# Patient Record
Sex: Female | Born: 1965 | Race: Black or African American | Hispanic: No | Smoking: Former smoker
Health system: Southern US, Community
[De-identification: ages and names within clinical notes are randomized; demographics above are authoritative.]

## PROBLEM LIST (undated history)

## (undated) DIAGNOSIS — Z973 Presence of spectacles and contact lenses: Secondary | ICD-10-CM

## (undated) DIAGNOSIS — F329 Major depressive disorder, single episode, unspecified: Secondary | ICD-10-CM

## (undated) DIAGNOSIS — K581 Irritable bowel syndrome with constipation: Secondary | ICD-10-CM

## (undated) DIAGNOSIS — E890 Postprocedural hypothyroidism: Secondary | ICD-10-CM

## (undated) DIAGNOSIS — G4733 Obstructive sleep apnea (adult) (pediatric): Secondary | ICD-10-CM

## (undated) DIAGNOSIS — F319 Bipolar disorder, unspecified: Secondary | ICD-10-CM

## (undated) DIAGNOSIS — E042 Nontoxic multinodular goiter: Secondary | ICD-10-CM

## (undated) DIAGNOSIS — K760 Fatty (change of) liver, not elsewhere classified: Secondary | ICD-10-CM

## (undated) DIAGNOSIS — F419 Anxiety disorder, unspecified: Secondary | ICD-10-CM

## (undated) DIAGNOSIS — N182 Chronic kidney disease, stage 2 (mild): Secondary | ICD-10-CM

## (undated) DIAGNOSIS — R000 Tachycardia, unspecified: Secondary | ICD-10-CM

## (undated) DIAGNOSIS — G5603 Carpal tunnel syndrome, bilateral upper limbs: Secondary | ICD-10-CM

## (undated) DIAGNOSIS — N809 Endometriosis, unspecified: Secondary | ICD-10-CM

---

## 1993-12-03 HISTORY — PX: DIAGNOSTIC LAPAROSCOPY: SUR761

## 2006-12-03 HISTORY — PX: ESOPHAGOGASTRODUODENOSCOPY: SHX1529

## 2014-12-03 HISTORY — PX: FOOT SURGERY: SHX648

## 2017-12-03 HISTORY — PX: COLONOSCOPY: SHX174

## 2018-08-29 ENCOUNTER — Encounter: Payer: Self-pay | Admitting: *Deleted

## 2018-08-29 DIAGNOSIS — G3184 Mild cognitive impairment, so stated: Secondary | ICD-10-CM | POA: Insufficient documentation

## 2018-08-29 DIAGNOSIS — F3162 Bipolar disorder, current episode mixed, moderate: Secondary | ICD-10-CM | POA: Insufficient documentation

## 2018-08-29 DIAGNOSIS — F429 Obsessive-compulsive disorder, unspecified: Secondary | ICD-10-CM

## 2018-09-08 ENCOUNTER — Ambulatory Visit: Payer: Self-pay | Admitting: Psychiatry

## 2018-09-17 ENCOUNTER — Ambulatory Visit: Payer: Self-pay | Admitting: Psychiatry

## 2018-10-06 ENCOUNTER — Ambulatory Visit: Payer: Federal, State, Local not specified - PPO | Admitting: Psychiatry

## 2018-10-06 ENCOUNTER — Encounter: Payer: Self-pay | Admitting: Psychiatry

## 2018-10-06 DIAGNOSIS — F411 Generalized anxiety disorder: Secondary | ICD-10-CM | POA: Diagnosis not present

## 2018-10-06 DIAGNOSIS — G3184 Mild cognitive impairment, so stated: Secondary | ICD-10-CM | POA: Diagnosis not present

## 2018-10-06 DIAGNOSIS — F319 Bipolar disorder, unspecified: Secondary | ICD-10-CM

## 2018-10-06 DIAGNOSIS — F422 Mixed obsessional thoughts and acts: Secondary | ICD-10-CM | POA: Diagnosis not present

## 2018-10-06 NOTE — Progress Notes (Signed)
Kristen Leon [id number] [birth year] 52 y.o.  Subjective:   Patient ID:  Kristen Leon is a 52 y.o. (DOB May 17, 1966) female.  Chief Complaint:  Chief Complaint  Patient presents with  . cognitive problems  . Depression  . Anxiety  . Follow-up    med changes    HPI Kristen Leon presents to the office today for follow-up of bipolar disorder OCD, anxiety, sleep problems and cognitive problems. Still having anxiety.  More anxious this time of year driving when it gets dark earlier. No avoidance.  Anxiety is a little worse off the Neurontin and so she uses it prn.  No cognitive benefits off of it. Not as much depression with the Vraylar.  But still a lot of emotional blunting to people or things pre-existed the Vraylar.  Vraylar helped the irritability by reducing the negative thoughts.  Still thinks she has some with her kids.  No major mood swings. Sleep pretty good usually.  Takes sleep meds Sonata at times. When she first took Lamictal it helped irritability but it stopped working within days. Over all pretty good since starting Vraylar which helped with negative thoughts. Less scattered on the Namenda.  Saw a neurologist about it and he agreed.  He's ordered follow up neuropsychological testing.  Review of Systems:  Review of Systems  Neurological: Negative for tremors and weakness.  Psychiatric/Behavioral: Negative for agitation, behavioral problems, confusion, decreased concentration, dysphoric mood, hallucinations, self-injury, sleep disturbance and suicidal ideas. The patient is not nervous/anxious and is not hyperactive.     Medications: I have reviewed the patient's current medications.  Current Outpatient Medications  Medication Sig Dispense Refill  . Acetylcysteine (NAC) 600 MG CAPS Take 1 capsule by mouth daily.    Marland Kitchen atenolol (TENORMIN) 50 MG tablet Take 50 mg by mouth daily.    . cariprazine (VRAYLAR) capsule Take 3 mg by mouth daily.    . Cholecalciferol (VITAMIN D3)  5000 units CAPS Take 1 capsule by mouth daily.    Marland Kitchen levothyroxine (SYNTHROID, LEVOTHROID) 112 MCG tablet Take 112 mcg by mouth daily before breakfast.    . lithium carbonate 150 MG capsule Take 150 mg by mouth 3 (three) times daily with meals.    . memantine (NAMENDA) 10 MG tablet Take 10 mg by mouth 2 (two) times daily.    . mirtazapine (REMERON) 30 MG tablet Take 30 mg by mouth at bedtime.    Marland Kitchen QUEtiapine (SEROQUEL) 200 MG tablet Take 600 mg by mouth at bedtime.      No current facility-administered medications for this visit.     Medication Side Effects: None  Allergies: Not on File  History reviewed. No pertinent past medical history.  History reviewed. No pertinent family history.  Social History   Socioeconomic History  . Marital status: Divorced    Spouse name: Not on file  . Number of children: Not on file  . Years of education: Not on file  . Highest education level: Not on file  Occupational History  . Not on file  Social Needs  . Financial resource strain: Not on file  . Food insecurity:    Worry: Not on file    Inability: Not on file  . Transportation needs:    Medical: Not on file    Non-medical: Not on file  Tobacco Use  . Smoking status: Former Smoker    Last attempt to quit: 2000    Years since quitting: 19.8  . Smokeless tobacco: Never Used  Substance and Sexual  Activity  . Alcohol use: Not on file  . Drug use: Not on file  . Sexual activity: Not on file  Lifestyle  . Physical activity:    Days per week: Not on file    Minutes per session: Not on file  . Stress: Not on file  Relationships  . Social connections:    Talks on phone: Not on file    Gets together: Not on file    Attends religious service: Not on file    Active member of club or organization: Not on file    Attends meetings of clubs or organizations: Not on file    Relationship status: Not on file  . Intimate partner violence:    Fear of current or ex partner: Not on file     Emotionally abused: Not on file    Physically abused: Not on file    Forced sexual activity: Not on file  Other Topics Concern  . Not on file  Social History Narrative  . Not on file    Past Medical History, Surgical history, Social history, and Family history were reviewed and updated as appropriate.   Please see review of systems for further details on the patient's review from today.   Objective:   Physical Exam:  There were no vitals taken for this visit.  Physical Exam  Constitutional: She is oriented to person, place, and time. She appears well-developed. No distress.  Musculoskeletal: She exhibits no deformity.  Neurological: She is alert and oriented to person, place, and time. She displays no tremor. Coordination and gait normal.  Psychiatric: She has a normal mood and affect. Her speech is normal and behavior is normal. Judgment and thought content normal. Her mood appears not anxious. Her affect is not angry, not blunt, not labile and not inappropriate. Thought content is not paranoid. Cognition and memory are normal. She does not exhibit a depressed mood. She expresses no homicidal and no suicidal ideation. She expresses no suicidal plans and no homicidal plans.  Insight intact. No auditory or visual hallucinations. No delusions.   Still complains of irritability. She is attentive.    Lab Review:  No results found for: NA, K, CL, CO2, GLUCOSE, BUN, CREATININE, CALCIUM, PROT, ALBUMIN, AST, ALT, ALKPHOS, BILITOT, GFRNONAA, GFRAA  No results found for: WBC, RBC, HGB, HCT, PLT, MCV, MCH, MCHC, RDW, LYMPHSABS, MONOABS, EOSABS, BASOSABS  No results found for: POCLITH, LITHIUM   No results found for: PHENYTOIN, PHENOBARB, VALPROATE, CBMZ   .res Assessment: Plan:    Bipolar I disorder (HCC)  Generalized anxiety disorder  Mixed obsessional thoughts and acts  MCI (mild cognitive impairment)  Please see After Visit Summary for patient specific instructions: Reduce  mirtazapine to 15mg  HS to see if irritability is better.  It may help you sleep better also.  Greater than 50% of face to face time with patient was spent on counseling and coordination of care. We discussed her chronic problems with irritability mood anxiety and cognition.  She is benefiting from the addition of Vraylar 3 mg daily Reduce Seroquel to 400mg  at night to potentially help reduce emotional blunting. Counseled patient regarding the use of lithium and low doses to prevent suicidality and improve cognition per Dr. Arlie Solomons.  Provided patient with copy of journal article related to the use of lithium or the prevention of suicidal thoughts.  Discussed that side effect risk is typically minimal with very low doses used to treat chronic suicidal thoughts. Reviewed her neuropsychological testing results which  she provided.  She thinks sx are similar to what was noted in 2014.  She saw no change in cognition off gabapentin.  She could consider going back on gabapentin for anxiety but we will defer that change. Reduce mirtazapine to 15mg  HS to see if irritability is better. Discussed potential metabolic side effects associated with atypical antipsychotics, as well as potential risk for movement side effects. Advised pt to contact office if movement side effects occur.  The Seroquel in higher doses may diminish some of the benefit of the Vraylar.  That is the reason for the change in dosage.  She is call if she has any mood cycling or insomnia.  This is a 30-minute appointment  Follow-up 2 months  Meredith Staggers MD, DFAPA No future appointments.  No orders of the defined types were placed in this encounter.     -------------------------------

## 2018-10-06 NOTE — Patient Instructions (Addendum)
Reduce mirtazapine to 15mg  HS to see if irritability is better.  It may help you sleep better also.  Reduce Seroquel to 400mg  at night to potentially help reduce emotional blunting.

## 2018-10-13 ENCOUNTER — Other Ambulatory Visit: Payer: Self-pay | Admitting: Psychiatry

## 2018-11-25 ENCOUNTER — Other Ambulatory Visit: Payer: Self-pay | Admitting: Psychiatry

## 2018-11-27 ENCOUNTER — Other Ambulatory Visit: Payer: Self-pay | Admitting: Psychiatry

## 2018-11-28 NOTE — Telephone Encounter (Signed)
Need to review paper chart  

## 2018-12-15 ENCOUNTER — Ambulatory Visit: Payer: Federal, State, Local not specified - PPO | Admitting: Psychiatry

## 2018-12-15 ENCOUNTER — Other Ambulatory Visit: Payer: Self-pay

## 2018-12-15 ENCOUNTER — Encounter: Payer: Self-pay | Admitting: Psychiatry

## 2018-12-15 DIAGNOSIS — F411 Generalized anxiety disorder: Secondary | ICD-10-CM

## 2018-12-15 DIAGNOSIS — F4000 Agoraphobia, unspecified: Secondary | ICD-10-CM

## 2018-12-15 DIAGNOSIS — G3184 Mild cognitive impairment, so stated: Secondary | ICD-10-CM

## 2018-12-15 DIAGNOSIS — F422 Mixed obsessional thoughts and acts: Secondary | ICD-10-CM

## 2018-12-15 DIAGNOSIS — F310 Bipolar disorder, current episode hypomanic: Secondary | ICD-10-CM

## 2018-12-15 MED ORDER — GABAPENTIN 600 MG PO TABS: 1200.0000 mg | ORAL_TABLET | Freq: Every day | ORAL | 0 refills | Status: DC

## 2018-12-15 MED ORDER — LORAZEPAM 0.5 MG PO TABS: 0.5000 mg | ORAL_TABLET | Freq: Three times a day (TID) | ORAL | 0 refills | Status: DC | PRN

## 2018-12-15 MED ORDER — ZALEPLON 10 MG PO CAPS: 10.0000 mg | ORAL_CAPSULE | Freq: Every evening | ORAL | 0 refills | Status: DC | PRN

## 2018-12-15 MED ORDER — CARIPRAZINE HCL 4.5 MG PO CAPS: 4.5000 mg | ORAL_CAPSULE | Freq: Every day | ORAL | 1 refills | Status: DC

## 2018-12-15 NOTE — Patient Instructions (Signed)
Increase Vraylar to 4.5 mg daily.
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
## 2019-01-12 ENCOUNTER — Ambulatory Visit: Payer: Federal, State, Local not specified - PPO | Admitting: Psychiatry

## 2019-01-12 ENCOUNTER — Encounter: Payer: Self-pay | Admitting: Psychiatry

## 2019-01-12 DIAGNOSIS — F4000 Agoraphobia, unspecified: Secondary | ICD-10-CM | POA: Diagnosis not present

## 2019-01-12 DIAGNOSIS — G3184 Mild cognitive impairment, so stated: Secondary | ICD-10-CM

## 2019-01-12 DIAGNOSIS — F422 Mixed obsessional thoughts and acts: Secondary | ICD-10-CM | POA: Diagnosis not present

## 2019-01-12 DIAGNOSIS — F310 Bipolar disorder, current episode hypomanic: Secondary | ICD-10-CM

## 2019-01-12 DIAGNOSIS — F411 Generalized anxiety disorder: Secondary | ICD-10-CM
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
## 2019-02-05 ENCOUNTER — Other Ambulatory Visit: Payer: Self-pay | Admitting: Obstetrics and Gynecology

## 2019-02-09 ENCOUNTER — Other Ambulatory Visit: Payer: Self-pay | Admitting: Psychiatry

## 2019-02-23 ENCOUNTER — Other Ambulatory Visit: Payer: Self-pay | Admitting: Psychiatry

## 2019-02-25 ENCOUNTER — Other Ambulatory Visit: Payer: Self-pay | Admitting: Psychiatry

## 2019-03-08 ENCOUNTER — Other Ambulatory Visit: Payer: Self-pay | Admitting: Psychiatry

## 2019-03-09 ENCOUNTER — Ambulatory Visit (INDEPENDENT_AMBULATORY_CARE_PROVIDER_SITE_OTHER): Payer: Federal, State, Local not specified - PPO | Admitting: Psychiatry

## 2019-03-09 ENCOUNTER — Encounter: Payer: Self-pay | Admitting: Psychiatry

## 2019-03-09 ENCOUNTER — Other Ambulatory Visit: Payer: Self-pay | Admitting: Psychiatry

## 2019-03-09 DIAGNOSIS — F319 Bipolar disorder, unspecified: Secondary | ICD-10-CM | POA: Diagnosis not present

## 2019-03-09 DIAGNOSIS — F411 Generalized anxiety disorder: Secondary | ICD-10-CM

## 2019-03-09 DIAGNOSIS — G3184 Mild cognitive impairment, so stated: Secondary | ICD-10-CM

## 2019-03-09 DIAGNOSIS — F4000 Agoraphobia, unspecified: Secondary | ICD-10-CM

## 2019-03-09 NOTE — Progress Notes (Signed)
Kristen Leon [id number] 1966-10-10 53 y.o.  Subjective:   Patient ID:  Kristen Leon is a 53 y.o. (DOB [birth year]) female.  Chief Complaint:  Chief Complaint  Patient presents with  . Sleeping Problem  . Anxiety  . Follow-up    mood    HPI   Kristen Leon presents  today for follow-up ofBipolar disorder, generalized anxiety disorder, nocturnal panic attacks, history of OCD, and mild cognitive impairment and worsening driving anxiety.  She was last seen January 12, 2019.  She had failed an attempt to switch from Seroquel to [employer].  So we discontinued the Vraylar.  Mood pretty stable and better bc better relationship with kids.  Anxiety is not good with Covid and gets anxious at night and has to get OOB.  Driving anxiety and sense unreality resolved.  Fine with driving now.  She thinks maybe Vraylar increased anxiety and it's more normal now.  Taking gabapentin for anxiety but worries it affects her memory.  Pt reports that mood is Anxious and describes anxiety as Moderate. Anxiety symptoms include: Excessive Worry, Panic Symptoms,. Pt reports has difficulty falling asleep butr variable.  8 hours usually.  Mild mood cycling.. Pt reports that appetite is good. Pt reports that energy is good and good. Concentration is good. Suicidal thoughts:  denied by patient.  When last here had manic symptoms but not since then.  Overall mood is more stable although there is some mild cycling.  Because anxiety is improved and she feels the gabapentin may adversely affect her memory she wants to try to wean off of it.  Suggested weaning over 2-week..  Past Psychiatric Medication Trials: Vraylar 3 SE, Depakote NR, carbamazepine questionable side effects, lamotrigine lost response, Trileptal, clozapine, olanzapine, lithium no response, mirtazapine, several SSRIs, gabapentin, clomipramine had vision side effects, Seroquel 600, risperidone 1 mg twice daily  Review of Systems:  Review of Systems   Neurological: Negative for tremors and weakness.  Psychiatric/Behavioral: Positive for sleep disturbance. Negative for agitation, behavioral problems, confusion, decreased concentration, dysphoric mood, hallucinations, self-injury and suicidal ideas. The patient is nervous/anxious. The patient is not hyperactive.     Medications: I have reviewed the patient's current medications.  Current Outpatient Medications  Medication Sig Dispense Refill  . Acetylcysteine (NAC) 600 MG CAPS Take 1 capsule by mouth daily.    Marland Kitchen atenolol (TENORMIN) 50 MG tablet Take 50 mg by mouth daily.    . B Complex Vitamins (VITAMIN B-COMPLEX) TABS Take by mouth.    . Cholecalciferol (VITAMIN D3) 5000 units CAPS Take 1 capsule by mouth daily.    Marland Kitchen gabapentin (NEURONTIN) 600 MG tablet TAKE 2 TABLETS (=1,200 MG) AT BEDTIME 180 tablet 0  . levothyroxine (SYNTHROID, LEVOTHROID) 112 MCG tablet Take 112 mcg by mouth daily before breakfast.    . linaclotide (LINZESS) 290 MCG CAPS capsule TAKE 1 CAPSULE DAILY    . lithium carbonate 150 MG capsule TAKE 1 CAPSULE EVERY       EVENING 90 capsule 0  . memantine (NAMENDA) 10 MG tablet TAKE 1 TABLET TWICE A DAY 180 tablet 0  . mirtazapine (REMERON) 30 MG tablet TAKE 1 TABLET AT BEDTIME 90 tablet 1  . QUEtiapine (SEROQUEL) 200 MG tablet TAKE 3 TABLETS AT BEDTIME 270 tablet 1  . temazepam (RESTORIL) 15 MG capsule at bedtime as needed.    . zaleplon (SONATA) 10 MG capsule Take 1 capsule (10 mg total) by mouth at bedtime as needed for sleep. 30 capsule 0  . LORazepam (ATIVAN) 0.5  MG tablet Take 1-2 tablets (0.5-1 mg total) by mouth every 8 (eight) hours as needed for anxiety. (Patient not taking: Reported on 03/09/2019) 30 tablet 0   No current facility-administered medications for this visit.     Medication Side Effects: None  Allergies: No Known Allergies  History reviewed. No pertinent past medical history.  History reviewed. No pertinent family history.  Social History    Socioeconomic History  . Marital status: Divorced    Spouse name: Not on file  . Number of children: Not on file  . Years of education: Not on file  . Highest education level: Not on file  Occupational History  . Not on file  Social Needs  . Financial resource strain: Not on file  . Food insecurity:    Worry: Not on file    Inability: Not on file  . Transportation needs:    Medical: Not on file    Non-medical: Not on file  Tobacco Use  . Smoking status: Former Smoker    Last attempt to quit: 2000    Years since quitting: 20.2  . Smokeless tobacco: Never Used  Substance and Sexual Activity  . Alcohol use: Not on file  . Drug use: Not on file  . Sexual activity: Not on file  Lifestyle  . Physical activity:    Days per week: Not on file    Minutes per session: Not on file  . Stress: Not on file  Relationships  . Social connections:    Talks on phone: Not on file    Gets together: Not on file    Attends religious service: Not on file    Active member of club or organization: Not on file    Attends meetings of clubs or organizations: Not on file    Relationship status: Not on file  . Intimate partner violence:    Fear of current or ex partner: Not on file    Emotionally abused: Not on file    Physically abused: Not on file    Forced sexual activity: Not on file  Other Topics Concern  . Not on file  Social History Narrative  . Not on file    Past Medical History, Surgical history, Social history, and Family history were reviewed and updated as appropriate.   Please see review of systems for further details on the patient's review from today.   Objective:   Physical Exam:  There were no vitals taken for this visit.  Physical Exam Neurological:     Mental Status: She is alert and oriented to person, place, and time.     Cranial Nerves: No dysarthria.  Psychiatric:        Attention and Perception: Attention normal.        Mood and Affect: Mood is anxious.         Speech: Speech normal.        Behavior: Behavior is cooperative.        Thought Content: Thought content normal. Thought content is not paranoid or delusional. Thought content does not include homicidal or suicidal ideation. Thought content does not include homicidal or suicidal plan.        Cognition and Memory: She exhibits impaired recent memory. She does not exhibit impaired remote memory.        Judgment: Judgment normal.     Comments: Insight and judgment appear good.     Lab Review:  No results found for: NA, K, CL, CO2, GLUCOSE, BUN, CREATININE, CALCIUM,  PROT, ALBUMIN, AST, ALT, ALKPHOS, BILITOT, GFRNONAA, GFRAA  No results found for: WBC, RBC, HGB, HCT, PLT, MCV, MCH, MCHC, RDW, LYMPHSABS, MONOABS, EOSABS, BASOSABS  No results found for: POCLITH, LITHIUM   No results found for: PHENYTOIN, PHENOBARB, VALPROATE, CBMZ   .res Assessment: Plan:    Bipolar I disorder (HCC)  Generalized anxiety disorder  MCI (mild cognitive impairment)  Agoraphobia   Greater than 50% of face to face time by phone with patient was spent on counseling and coordination of care. We discussed nature of her psychiatric diagnoses .  the patient's anxiety has improved since she was here.  Perhaps due to discontinuing the Vraylar though it seemed to have some positive mood affect she was not able to transition to it without Seroquel.  Because her anxiety is improved she wants to try to wean the gabapentin because she feels it may adversely affect her memory which is an ongoing concern.  Generalized anxiety disorder is improved not resolved.  Agoraphobia has almost resolved.  Encourage she resist the urge to avoid driving including highways. Wean gabapentin over 2-week.  Let us know if the anxiety gets worse.  Bipolar disorder is stable back on Seroquel for the most part.  He has mild cycling failed attempt to wean Seroquel to Vraylar.   Question about whether the Vraylar helped enough to justify it's  use in polypharmacy at 1.5 mg daily..     OK prn lorazepam.  Caution re driving and drowsiness.  We discussed the short-term risks associated with benzodiazepines including sedation and increased fall risk among others.  Discussed long-term side effect risk including dependence, potential withdrawal symptoms, and the potential eventual dose-related risk of dementia. OK continue Neurontin for anxiety or wean whichever she requests.  Discussed potential metabolic side effects associated with atypical antipsychotics, as well as potential risk for movement side effects. Advised pt to contact office if movement side effects occur.   No med changes today  This was a 30-minute appointment  FU 3 month  I connected with patient by a video enabled telemedicine application or telephone, with their informed consent, and verified patient privacy and that I am speaking with the correct person using two identifiers.  I was located at office and patient at home.  Patient did not have the capability technically to do video conferencing.Meredith Staggers.   Carey Cottle, MD, DFAPA   Please see After Visit Summary for patient specific instructions.  No future appointments.  No orders of the defined types were placed in this encounter.     -------------------------------

## 2019-04-23 ENCOUNTER — Other Ambulatory Visit: Payer: Self-pay | Admitting: Psychiatry

## 2019-05-09 ENCOUNTER — Other Ambulatory Visit: Payer: Self-pay | Admitting: Psychiatry

## 2019-06-22 ENCOUNTER — Ambulatory Visit: Payer: Federal, State, Local not specified - PPO | Admitting: Psychiatry

## 2019-06-29 ENCOUNTER — Encounter: Payer: Self-pay | Admitting: Internal Medicine

## 2019-06-29 ENCOUNTER — Telehealth: Payer: Self-pay | Admitting: Pharmacy Technician

## 2019-06-29 ENCOUNTER — Ambulatory Visit: Payer: Federal, State, Local not specified - PPO | Admitting: Internal Medicine

## 2019-06-29 ENCOUNTER — Other Ambulatory Visit: Payer: Self-pay

## 2019-06-29 VITALS — BP 107/78 | HR 75 | Temp 98.7°F

## 2019-06-29 DIAGNOSIS — Z20828 Contact with and (suspected) exposure to other viral communicable diseases: Secondary | ICD-10-CM | POA: Diagnosis not present

## 2019-06-29 NOTE — Progress Notes (Signed)
RFV: HSV +, referral from Kristen Leon, at [facility]  Patient ID: Kristen Leon, female   DOB: 11/15/1966, 53 y.o.   MRN: [id number]  HPI 53 yo F with HSV. But recently seen in early June by dr Amado Nashalmquist for persistent vaginal itching. Empirically treated for vaginal candidiasis, as well as dobetasol. Has hx of hsv exposure and previous treatment with antiviral.  Lab work from her PCP office includes negative for HSV 2 IGG, and HSV 1 IGG. Equivocal on IGM 1/2. Patient referred to see interpretation of results in addition to seeing if presentation consistent with hsv. She last had possible herpes exposure with previous partner in dec 2019, did not recall any outbreak. However, most recently, having vaginal discomfort  Outpatient Encounter Medications as of 06/29/2019  Medication Sig  . atenolol (TENORMIN) 50 MG tablet Take 50 mg by mouth daily.  . B Complex Vitamins (VITAMIN B-COMPLEX) TABS Take by mouth.  . Cholecalciferol (VITAMIN D3) 5000 units CAPS Take 1 capsule by mouth daily.  Marland Kitchen. gabapentin (NEURONTIN) 600 MG tablet TAKE 2 TABLETS (=1,200 MG) AT BEDTIME  . levothyroxine (SYNTHROID, LEVOTHROID) 112 MCG tablet Take 112 mcg by mouth daily before breakfast.  . linaclotide (LINZESS) 290 MCG CAPS capsule TAKE 1 CAPSULE DAILY  . lithium carbonate 150 MG capsule TAKE 1 CAPSULE EVERY       EVENING  . memantine (NAMENDA) 10 MG tablet TAKE 1 TABLET TWICE A DAY  . mirtazapine (REMERON) 30 MG tablet TAKE 1 TABLET AT BEDTIME  . QUEtiapine (SEROQUEL) 200 MG tablet TAKE 3 TABLETS AT BEDTIME  . temazepam (RESTORIL) 15 MG capsule at bedtime as needed.  . zaleplon (SONATA) 10 MG capsule Take 1 capsule (10 mg total) by mouth at bedtime as needed for sleep.  . Acetylcysteine (NAC) 600 MG CAPS Take 1 capsule by mouth daily.  Marland Kitchen. LORazepam (ATIVAN) 0.5 MG tablet Take 1-2 tablets (0.5-1 mg total) by mouth every 8 (eight) hours as needed for anxiety. (Patient not taking: Reported on 06/29/2019)    No facility-administered encounter medications on file as of 06/29/2019.      Patient Active Problem List   Diagnosis Date Noted  . Bipolar 1 disorder, mixed, moderate (HCC) 08/29/2018  . OCD (obsessive compulsive disorder) 08/29/2018  . MCI (mild cognitive impairment) 08/29/2018     Health Maintenance Due  Topic Date Due  . HIV Screening  07/18/1982  . [name]  07/18/1988  . MAMMOGRAM  07/18/2017  . COLONOSCOPY  07/18/2017    Social History   Tobacco Use  . Smoking status: Former Smoker    Quit date: 2000    Years since quitting: 20.5  . Smokeless tobacco: Never Used  Substance Use Topics  . Alcohol use: Not on file  . Drug use: Not on file  -lawyer for [employer]  Family hx: DM, CAD with maternal side Review of Systems 12 point ros is negative except what is mentioned above Review of Systems  Constitutional: Negative for fever, chills, diaphoresis, activity change, appetite change, fatigue and unexpected weight change.  HENT: Negative for congestion, sore throat, rhinorrhea, sneezing, trouble swallowing and sinus pressure.  Eyes: Negative for photophobia and visual disturbance.  Respiratory: Negative for cough, chest tightness, shortness of breath, wheezing and stridor.  Cardiovascular: Negative for chest pain, palpitations and leg swelling.  Gastrointestinal: Negative for nausea, vomiting, abdominal pain, diarrhea, constipation, blood in stool, abdominal distention and anal bleeding.  Genitourinary: Negative for dysuria, hematuria, flank pain and difficulty urinating.  Musculoskeletal:  Negative for myalgias, back pain, joint swelling, arthralgias and gait problem.  Skin: Negative for color change, pallor, rash and wound.  Neurological: Negative for dizziness, tremors, weakness and light-headedness.  Hematological: Negative for adenopathy. Does not bruise/bleed easily.  Psychiatric/Behavioral: Negative for behavioral problems, confusion, sleep  disturbance, dysphoric mood, decreased concentration and agitation.    Physical Exam   BP 107/78   Pulse 75   Temp 98.7 F (37.1 C) (Oral)   No exam   Assessment and Plan  hsv Ig M equivocal results = described what would be expect (IgG +) this distant from exposure if she had contracted hsv. Also, has not had any rash that would be c/w hsv. 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 Will defer to dr almquist  Spent 45 min with patient with greater than 50% in counseling on hsv

## 2019-06-29 NOTE — Telephone Encounter (Signed)
RD Patient Product/process development scientist verification completed.    The patient is insured through [employer] and has a $956.06 copay.  We will continue to follow to see if copay assistance is needed.  Venida Jarvis. Nadara Mustard [place] [facility] Phone: [phone] Fax:  [phone]

## 2019-06-30 LAB — HSV 2 ANTIBODY, IGG: HSV 2 Glycoprotein G Ab, IgG: <0.9 index

## 2019-06-30 LAB — HIV ANTIBODY (ROUTINE TESTING W REFLEX): HIV 1&2 Ab, 4th Generation: NONREACTIVE

## 2019-06-30 LAB — HSV 1 ANTIBODY, IGG: HSV 1 Glycoprotein G Ab, IgG: <0.9 index

## 2019-07-03 ENCOUNTER — Other Ambulatory Visit: Payer: Self-pay | Admitting: Psychiatry

## 2019-07-03 LAB — HSV 1/2 AB (IGM), IFA W/RFLX TITER
HSV 1 IgM Screen: NEGATIVE
HSV 2 IgM Screen: NEGATIVE

## 2019-07-06 ENCOUNTER — Other Ambulatory Visit: Payer: Self-pay

## 2019-07-06 ENCOUNTER — Encounter: Payer: Self-pay | Admitting: Internal Medicine

## 2019-07-06 ENCOUNTER — Ambulatory Visit (INDEPENDENT_AMBULATORY_CARE_PROVIDER_SITE_OTHER): Payer: Federal, State, Local not specified - PPO | Admitting: Internal Medicine

## 2019-07-06 DIAGNOSIS — Z20828 Contact with and (suspected) exposure to other viral communicable diseases: Secondary | ICD-10-CM | POA: Diagnosis not present

## 2019-07-06 NOTE — Progress Notes (Signed)
   Follow up: hsv testing and vaginal itching  Patient ID: Kristen Leon, female   DOB: [birth year], 53 y.o.   MRN: [id number]  HPI 53yo F with vaginal itching, she was seen by myself and her ob in the last few weeks. She started probiotics and metronidazole bid x 14 d for aeorobic vaginitis. Patient has not noticed much improvement  Outpatient Encounter Medications as of 07/06/2019  Medication Sig  . atenolol (TENORMIN) 50 MG tablet Take 50 mg by mouth daily.  . B Complex Vitamins (VITAMIN B-COMPLEX) TABS Take by mouth.  . Cholecalciferol (VITAMIN D3) 5000 units CAPS Take 1 capsule by mouth daily.  Marland Kitchen gabapentin (NEURONTIN) 600 MG tablet TAKE 2 TABLETS (=1,200 MG) AT BEDTIME  . levothyroxine (SYNTHROID, LEVOTHROID) 112 MCG tablet Take 112 mcg by mouth daily before breakfast.  . linaclotide (LINZESS) 290 MCG CAPS capsule TAKE 1 CAPSULE DAILY  . lithium carbonate 150 MG capsule TAKE 1 CAPSULE EVERY       EVENING  . LORazepam (ATIVAN) 0.5 MG tablet Take 1-2 tablets (0.5-1 mg total) by mouth every 8 (eight) hours as needed for anxiety.  . memantine (NAMENDA) 10 MG tablet TAKE 1 TABLET TWICE A DAY  . mirtazapine (REMERON) 30 MG tablet TAKE 1 TABLET AT BEDTIME  . QUEtiapine (SEROQUEL) 200 MG tablet TAKE 3 TABLETS AT BEDTIME  . temazepam (RESTORIL) 15 MG capsule at bedtime as needed.  . zaleplon (SONATA) 10 MG capsule Take 1 capsule (10 mg total) by mouth at bedtime as needed for sleep.  . Acetylcysteine (NAC) 600 MG CAPS Take 1 capsule by mouth daily.   No facility-administered encounter medications on file as of 07/06/2019.      Patient Active Problem List   Diagnosis Date Noted  . Bipolar 1 disorder, mixed, moderate ([place]) 08/29/2018  . OCD (obsessive compulsive disorder) 08/29/2018  . MCI (mild cognitive impairment) 08/29/2018     Health Maintenance Due  Topic Date Due  . PAP SMEAR-Modifier  07/18/1988  . MAMMOGRAM  07/18/2017  . COLONOSCOPY  07/18/2017  . INFLUENZA VACCINE   07/04/2019     Review of Systems + vaginal itching, no foul odor. No vaginal ulcers or lesion. 12 point ros is otherwise negative Physical Exam   There were no vitals taken for this visit.   Did not examine, only gave results to testing  Assessment and Plan  Gave results to hsv testing - it does not look like she has HSV or HIV per antibody testing  Vaginal itching = agree with the plan with her OB/GYN of treating with metronidazole. Suspect some element of vaginal bacterial shift with menopause.  Assured her that her symptoms are not related to HSV  Spent 15 min with patient in face-to-face interface, counseling on the aforementioned

## 2019-07-22 ENCOUNTER — Other Ambulatory Visit: Payer: Self-pay | Admitting: Psychiatry

## 2019-08-06 ENCOUNTER — Other Ambulatory Visit: Payer: Self-pay

## 2019-08-06 ENCOUNTER — Encounter: Payer: Self-pay | Admitting: Psychiatry

## 2019-08-06 ENCOUNTER — Ambulatory Visit (INDEPENDENT_AMBULATORY_CARE_PROVIDER_SITE_OTHER): Payer: Federal, State, Local not specified - PPO | Admitting: Psychiatry

## 2019-08-06 DIAGNOSIS — F5105 Insomnia due to other mental disorder: Secondary | ICD-10-CM

## 2019-08-06 DIAGNOSIS — F319 Bipolar disorder, unspecified: Secondary | ICD-10-CM | POA: Diagnosis not present

## 2019-08-06 DIAGNOSIS — F4000 Agoraphobia, unspecified: Secondary | ICD-10-CM

## 2019-08-06 DIAGNOSIS — F422 Mixed obsessional thoughts and acts: Secondary | ICD-10-CM | POA: Diagnosis not present

## 2019-08-06 DIAGNOSIS — F411 Generalized anxiety disorder: Secondary | ICD-10-CM

## 2019-08-06 DIAGNOSIS — G3184 Mild cognitive impairment, so stated: Secondary | ICD-10-CM

## 2019-08-06 DIAGNOSIS — R7989 Other specified abnormal findings of blood chemistry: Secondary | ICD-10-CM

## 2019-08-06 MED ORDER — ZALEPLON 10 MG PO CAPS: 10.0000 mg | ORAL_CAPSULE | Freq: Every evening | ORAL | 0 refills | Status: DC | PRN

## 2019-08-06 NOTE — Progress Notes (Signed)
Kristen PapWinnie Leon [id number] 09-23-1966 53 y.o.  Subjective:   Patient ID:  Kristen PapWinnie Leon is a 53 y.o. (DOB 09-23-1966) female.  Chief Complaint:  Chief Complaint  Patient presents with  . Follow-up    Medication Management  . Other    Bipolar  . Medication Refill    Sonata  . Memory Loss    HPI   Kristen PapWinnie Leon presents  today for follow-up ofBipolar disorder, generalized anxiety disorder, nocturnal panic attacks, history of OCD, and mild cognitive impairment and worsening driving anxiety.  Last visit March 09, 2019.  No meds were changed.  Trying to get off gabapentin again.  Was added for anxiety and now not as anxious.  Now down to 300 mg daily.  It has helped the quality of her sleep in helpful way but a little worse with the reduction and mood is a little uptight.  Wants off for cognitive reasons but has seen no cognitive benefit with reduction.  Other concern is focus and memory.  Struggle with simple things.  Ex trouble opening tissue box bc was trying to open the wrong side.  Not keeping track of time as well.  Wants to get off gabapentin and Seroquel for that reason.  Feels the meds may increase risk of dementia.  tempazepam only 1 every other week.  Sleep 11 to 630.  Awakens and may stay awake 30-60 mins total.  Tried melatonin with some success in the past.  Rare Ativan. Wants refill Sonata.  Occ use.  Mood pretty stable and better bc better relationship with kids.  A little anger but not much depression and no other sx of mania.  Anxiety is not good with Covid and gets anxious at night and has to get OOB.  Driving anxiety and sense unreality resolved.  Fine with driving now.   Pt reports that mood is Anxious and describes anxiety as Moderate. Anxiety symptoms include: Excessive Worry, Panic Symptoms,. Pt reports has difficulty falling asleep butr variable.  8 hours usually.  Mild mood cycling.. Pt reports that appetite is good. Pt reports that energy is good and good.  Concentration is not good and she complains of overall cognitive problems and is worried about eventual developing dementia . Suicidal thoughts:  denied by patient.    When manic feels more reckless, spending, talk to herself, driven, reduced sleep, irritable and angry.  Because anxiety is improved and she feels the gabapentin may adversely affect her memory she wants to try to wean off of it.  Suggested weaning over 2-week..  Past Psychiatric Medication Trials: Vraylar 3 SE, Abilify, brief Latuda ? effect, Depakote NR, carbamazepine questionable side effects, lamotrigine lost response, Trileptal, clozapine, olanzapine, lithium no response, mirtazapine, several SSRIs, gabapentin, clomipramine had vision side effects, Seroquel 600, risperidone 1 mg twice daily failed an attempt to switch from Seroquel to Vraylar early 2020 No psych hosp.  Review of Systems:  Review of Systems  Neurological: Negative for dizziness, tremors and weakness.  Psychiatric/Behavioral: Positive for sleep disturbance. Negative for agitation, behavioral problems, confusion, decreased concentration, dysphoric mood, hallucinations, self-injury and suicidal ideas. The patient is nervous/anxious. The patient is not hyperactive.     Medications: I have reviewed the patient's current medications.  Current Outpatient Medications  Medication Sig Dispense Refill  . Acetylcysteine (NAC) 600 MG CAPS Take 1 capsule by mouth daily.    Marland Kitchen. atenolol (TENORMIN) 50 MG tablet Take 50 mg by mouth daily.    . B Complex Vitamins (VITAMIN B-COMPLEX) TABS Take by  mouth.    . Cholecalciferol (VITAMIN D3) 5000 units CAPS Take 1 capsule by mouth daily.    Marland Kitchen. gabapentin (NEURONTIN) 600 MG tablet TAKE 2 TABLETS (=1,200 MG) AT BEDTIME (Patient taking differently: 300 mg. ) 180 tablet 0  . levothyroxine (SYNTHROID, LEVOTHROID) 112 MCG tablet Take 112 mcg by mouth daily before breakfast.    . linaclotide (LINZESS) 290 MCG CAPS capsule TAKE 1 CAPSULE  DAILY    . lithium carbonate 150 MG capsule TAKE 1 CAPSULE EVERY       EVENING 90 capsule 0  . LORazepam (ATIVAN) 0.5 MG tablet Take 1-2 tablets (0.5-1 mg total) by mouth every 8 (eight) hours as needed for anxiety. 30 tablet 0  . memantine (NAMENDA) 10 MG tablet TAKE 1 TABLET TWICE A DAY 180 tablet 0  . mirtazapine (REMERON) 30 MG tablet TAKE 1 TABLET AT BEDTIME 90 tablet 1  . QUEtiapine (SEROQUEL) 200 MG tablet TAKE 3 TABLETS AT BEDTIME 270 tablet 1  . temazepam (RESTORIL) 15 MG capsule at bedtime as needed.    . zaleplon (SONATA) 10 MG capsule Take 1 capsule (10 mg total) by mouth at bedtime as needed for sleep. 30 capsule 0   No current facility-administered medications for this visit.     Medication Side Effects: None  Allergies: No Known Allergies  History reviewed. No pertinent past medical history.  History reviewed. No pertinent family history.  Social History   Socioeconomic History  . Marital status: Divorced    Spouse name: Not on file  . Number of children: Not on file  . Years of education: Not on file  . Highest education level: Not on file  Occupational History  . Not on file  Social Needs  . Financial resource strain: Not on file  . Food insecurity    Worry: Not on file    Inability: Not on file  . Transportation needs    Medical: Not on file    Non-medical: Not on file  Tobacco Use  . Smoking status: Former Smoker    Quit date: 2000    Years since quitting: 20.6  . Smokeless tobacco: Never Used  Substance and Sexual Activity  . Alcohol use: Not on file  . Drug use: Not on file  . Sexual activity: Not on file  Lifestyle  . Physical activity    Days per week: Not on file    Minutes per session: Not on file  . Stress: Not on file  Relationships  . Social Musicianconnections    Talks on phone: Not on file    Gets together: Not on file    Attends religious service: Not on file    Active member of club or organization: Not on file    Attends meetings of  clubs or organizations: Not on file    Relationship status: Not on file  . Intimate partner violence    Fear of current or ex partner: Not on file    Emotionally abused: Not on file    Physically abused: Not on file    Forced sexual activity: Not on file  Other Topics Concern  . Not on file  Social History Narrative  . Not on file    Past Medical History, Surgical history, Social history, and Family history were reviewed and updated as appropriate.   Please see review of systems for further details on the patient's review from today.   Objective:   Physical Exam:  There were no vitals taken for this  visit.  Physical Exam Neurological:     Mental Status: She is alert and oriented to person, place, and time.     Cranial Nerves: No dysarthria.  Psychiatric:        Attention and Perception: Attention normal.        Mood and Affect: Mood is anxious.        Speech: Speech normal.        Behavior: Behavior is cooperative.        Thought Content: Thought content normal. Thought content is not paranoid or delusional. Thought content does not include homicidal or suicidal ideation. Thought content does not include homicidal or suicidal plan.        Cognition and Memory: She exhibits impaired recent memory. She does not exhibit impaired remote memory.        Judgment: Judgment normal.     Comments: Insight and judgment appear good. Denies mania symptoms or severe depression     Lab Review:  No results found for: NA, K, CL, CO2, GLUCOSE, BUN, CREATININE, CALCIUM, PROT, ALBUMIN, AST, ALT, ALKPHOS, BILITOT, GFRNONAA, GFRAA  No results found for: WBC, RBC, HGB, HCT, PLT, MCV, MCH, MCHC, RDW, LYMPHSABS, MONOABS, EOSABS, BASOSABS  No results found for: POCLITH, LITHIUM   No results found for: PHENYTOIN, PHENOBARB, VALPROATE, CBMZ   .res Assessment: Plan:    Bipolar I disorder ([place])  Generalized anxiety disorder  Mixed obsessional thoughts and acts  Agoraphobia  MCI (mild  cognitive impairment) - Plan: Vitamin D 1,25 dihydroxy, B12 and Folate Panel  Insomnia due to mental condition - Plan: zaleplon (SONATA) 10 MG capsule  Low vitamin D level - Plan: Vitamin D 1,25 dihydroxy   Greater than 50% of face to face time by phone with patient was spent on counseling and coordination of care. We discussed nature of her psychiatric diagnoses .  She needs a mood stabilizer of some sort but is wanting 1 with the lowest risk of developing dementia.  We discussed there is inherent increased risk of dementia associated with bipolar disorder.  While there is no proof of this the assumption is that generally the less sedating antipsychotics would presumably have less overall risk of cognitive problems both short-term and long-term.  The key is finding 1 that works for her.  She is failed all the major mood stabilizers that are not antipsychotics.  Bipolar disorder is stable back on Seroquel for the most part.  He has mild cycling failed attempt to wean Seroquel to Vraylar.     Because her anxiety is improved she wants to try to wean the gabapentin because she feels it may adversely affect her memory which is an ongoing concern.  Generalized anxiety disorder is improved not resolved.  Agoraphobia has almost resolved.  Encourage she resist the urge to avoid driving including highways. Wean gabapentin over 2-week.  Let us know if the anxiety gets worse.  Option retry Abilify, Caplyta, Latuda.  Disc differences.  Failed multiple meds. She prefers not to try a a brand new med.  Wants to retry Latuda.  Disc with food.  OK prn lorazepam.  Caution re driving and drowsiness.  We discussed the short-term risks associated with benzodiazepines including sedation and increased fall risk among others.  Discussed long-term side effect risk including dependence, potential withdrawal symptoms, and the potential eventual dose-related risk of dementia. OK continue Neurontin wean per her request but she  has had no improvement in cognition as she is reduce the dose.  Discussed potential  metabolic side effects associated with atypical antipsychotics, as well as potential risk for movement side effects. Advised pt to contact office if movement side effects occur.   Disc cognitive risks at length with meds and risks of untreated bipolar disorder for cognition as well. Discussed laboratory test that may help rule out easily treatable causes of cognitive impairment such as low B12 and folate and thyroid abnormalities.  Thyroid was normal a few months ago. Check B12 and folate.  Start Latuda 20 mg daily with food for 5 days, Then 40 mg daily for 5 days, Then 60 mg daily for 7 days, Then 80 mg daily No reduction in Seroquel until FU  Greater than 50% of face to face time with patient was spent on counseling and coordination of care.   FU 4-6 weeks  Meredith Staggers, MD, DFAPA   Please see After Visit Summary for patient specific instructions.  Future Appointments  Date Time Provider Department Center  09/03/2019 10:30 AM Cottle, Steva Ready., MD [facility] None    Orders Placed This Encounter  Procedures  . Vitamin D 1,25 dihydroxy  . B12 and Folate Panel      -------------------------------

## 2019-08-06 NOTE — Patient Instructions (Signed)
Start Latuda 20 mg daily with food for 5 days, Then 40 mg daily for 5 days, Then 60 mg daily for 7 days, Then 80 mg daily

## 2019-08-16 ENCOUNTER — Other Ambulatory Visit: Payer: Self-pay | Admitting: Psychiatry

## 2019-08-17 ENCOUNTER — Other Ambulatory Visit: Payer: Self-pay | Admitting: Psychiatry

## 2019-09-03 ENCOUNTER — Other Ambulatory Visit: Payer: Self-pay

## 2019-09-03 ENCOUNTER — Encounter: Payer: Self-pay | Admitting: Psychiatry

## 2019-09-03 ENCOUNTER — Ambulatory Visit (INDEPENDENT_AMBULATORY_CARE_PROVIDER_SITE_OTHER): Payer: Federal, State, Local not specified - PPO | Admitting: Psychiatry

## 2019-09-03 DIAGNOSIS — F411 Generalized anxiety disorder: Secondary | ICD-10-CM

## 2019-09-03 DIAGNOSIS — F310 Bipolar disorder, current episode hypomanic: Secondary | ICD-10-CM

## 2019-09-03 DIAGNOSIS — F4000 Agoraphobia, unspecified: Secondary | ICD-10-CM | POA: Diagnosis not present

## 2019-09-03 DIAGNOSIS — F422 Mixed obsessional thoughts and acts: Secondary | ICD-10-CM

## 2019-09-03 DIAGNOSIS — F319 Bipolar disorder, unspecified: Secondary | ICD-10-CM

## 2019-09-03 DIAGNOSIS — F5105 Insomnia due to other mental disorder: Secondary | ICD-10-CM

## 2019-09-03 DIAGNOSIS — R7989 Other specified abnormal findings of blood chemistry: Secondary | ICD-10-CM

## 2019-09-03 DIAGNOSIS — G3184 Mild cognitive impairment, so stated: Secondary | ICD-10-CM

## 2019-09-03 MED ORDER — LURASIDONE HCL 80 MG PO TABS: 80.0000 mg | ORAL_TABLET | Freq: Every day | ORAL | 0 refills | Status: DC

## 2019-09-03 NOTE — Progress Notes (Signed)
Kristen Leon [id number] 1966/10/23 53 y.o.  Virtual Visit via Telephone Note  I connected with pt by telephone and verified that I am speaking with the correct person using two identifiers.   I discussed the limitations, risks, security and privacy concerns of performing an evaluation and management service by telephone and the availability of in person appointments. I also discussed with the patient that there may be a patient responsible charge related to this service. The patient expressed understanding and agreed to proceed.  I discussed the assessment and treatment plan with the patient. The patient was provided an opportunity to ask questions and all were answered. The patient agreed with the plan and demonstrated an understanding of the instructions.   The patient was advised to call back or seek an in-person evaluation if the symptoms worsen or if the condition fails to improve as anticipated.  I provided 25 minutes of non-face-to-face time during this encounter. The call started at 1120 and ended at 1145. The patient was located at home and the provider was located office.  Subjective:   Patient ID:  Kristen Leon is a 53 y.o. (DOB 06/29/1966) female.  Chief Complaint:  Chief Complaint  Patient presents with  . Follow-up    Medication Management  . Other    Bipolar 1  . Medication Refill    Latuda    Medication Refill Pertinent negatives include no weakness.     Kristen Leon presents  today for follow-up ofBipolar disorder, generalized anxiety disorder, nocturnal panic attacks, history of OCD, and mild cognitive impairment and worsening driving anxiety.  She has a goal of getting off the Seroquel in hopes of improving memory.  Last visit August 06, 2019.  We needed to start a mood stabilizer and discussed variety of options and decided to start with Latuda.  We started at 20 mg and the plan was to increase gradually to 80 mg daily.  If that was successful this follow-up  was going to lead to a potential reduction in an attempt to wean Seroquel.  Never got up to 80 mg Latuda.  Taken 60 mg for 7 days.   So far it's fine.  Hasn't noticed much of a difference.  Sleep less deep and quality is less having weaned off the gabapentin.  More broken.  Getting about 5 hours of sleep.  Off the gabapentin for about 2 weeks.  Nothing better off the gabapentin including focus and memory.  Stopped it before and thinks it takes a few weeks to adjust to being off of it.    Trying to get rid of gabapentin and Seroquel.  Mood is irritable.  Some job stress with reactive depression and tearfulness is better at the present.  A little more agitated.  No manic sx otherwise.  Other concern is focus and memory.  Struggle with simple things.  Ex trouble opening tissue box bc was trying to open the wrong side.  Not keeping track of time as well.  Wants to get off gabapentin and Seroquel for that reason.  Feels the meds may increase risk of dementia.  tempazepam only 1 every other week.  Sleep 11 to 630.  Awakens and may stay awake 30-60 mins total.  Tried melatonin with some success in the past.  Rare Ativan. Wants refill Sonata.  Occ use.  Mood pretty stable and better bc better relationship with kids.  A little anger but not much depression and no other sx of mania.  Anxiety is not good with  Covid and gets anxious at night and has to get OOB.  Driving anxiety and sense unreality resolved.  Fine with driving now.   No panic lately.  Anxiety lower.  Typically in October some anxiety around driving as it gets darker but fine so far. Pt reports has difficulty falling asleep butr variable.  8 hours usually.  Mild mood cycling.. Pt reports that appetite is good. Pt reports that energy is good and good. Concentration is not good and she complains of overall cognitive problems and is worried about eventual developing dementia . Suicidal thoughts:  denied by patient.    When manic feels more  reckless, spending, talk to herself, driven, reduced sleep, irritable and angry.  Because anxiety is improved and she feels the gabapentin may adversely affect her memory she wants to try to wean off of it.  Suggested weaning over 2-week..  Past Psychiatric Medication Trials: Vraylar 3 SE, Abilify, brief Latuda ? effect, Depakote NR, carbamazepine questionable side effects, lamotrigine lost response, Trileptal, clozapine, olanzapine, lithium no response, mirtazapine, several SSRIs, gabapentin, clomipramine had vision side effects, Seroquel 600, risperidone 1 mg twice daily failed an attempt to switch from Seroquel to Vraylar early 2020 No psych hosp.  Review of Systems:  Review of Systems  Neurological: Negative for dizziness, tremors and weakness.  Psychiatric/Behavioral: Positive for sleep disturbance. Negative for agitation, behavioral problems, confusion, decreased concentration, dysphoric mood, hallucinations, self-injury and suicidal ideas. The patient is not nervous/anxious and is not hyperactive.     Medications: I have reviewed the patient's current medications.  Current Outpatient Medications  Medication Sig Dispense Refill  . Acetylcysteine (NAC) 600 MG CAPS Take 1 capsule by mouth daily.    Marland Kitchen. atenolol (TENORMIN) 50 MG tablet Take 50 mg by mouth daily.    . B Complex Vitamins (VITAMIN B-COMPLEX) TABS Take by mouth.    . Cholecalciferol (VITAMIN D3) 5000 units CAPS Take 1 capsule by mouth daily.    Marland Kitchen. levothyroxine (SYNTHROID, LEVOTHROID) 112 MCG tablet Take 112 mcg by mouth daily before breakfast.    . linaclotide (LINZESS) 290 MCG CAPS capsule TAKE 1 CAPSULE DAILY    . lithium carbonate 150 MG capsule TAKE 1 CAPSULE EVERY       EVENING 90 capsule 0  . LORazepam (ATIVAN) 0.5 MG tablet Take 1-2 tablets (0.5-1 mg total) by mouth every 8 (eight) hours as needed for anxiety. 30 tablet 0  . memantine (NAMENDA) 10 MG tablet TAKE 1 TABLET TWICE A DAY (Patient taking differently: Take 10  mg by mouth daily. ) 180 tablet 0  . mirtazapine (REMERON) 30 MG tablet TAKE 1 TABLET AT BEDTIME 90 tablet 1  . QUEtiapine (SEROQUEL) 200 MG tablet TAKE 3 TABLETS AT BEDTIME 270 tablet 1  . temazepam (RESTORIL) 15 MG capsule at bedtime as needed.    . zaleplon (SONATA) 10 MG capsule Take 1 capsule (10 mg total) by mouth at bedtime as needed for sleep. 30 capsule 0  . lurasidone (LATUDA) 80 MG TABS tablet Take 1 tablet (80 mg total) by mouth daily with breakfast. 90 tablet 0   No current facility-administered medications for this visit.     Medication Side Effects: None  Allergies: No Known Allergies  History reviewed. No pertinent past medical history.  History reviewed. No pertinent family history.  Social History   Socioeconomic History  . Marital status: Divorced    Spouse name: Not on file  . Number of children: Not on file  . Years of education: Not  on file  . Highest education level: Not on file  Occupational History  . Not on file  Social Needs  . Financial resource strain: Not on file  . Food insecurity    Worry: Not on file    Inability: Not on file  . Transportation needs    Medical: Not on file    Non-medical: Not on file  Tobacco Use  . Smoking status: Former Smoker    Quit date: 2000    Years since quitting: 20.7  . Smokeless tobacco: Never Used  Substance and Sexual Activity  . Alcohol use: Not on file  . Drug use: Not on file  . Sexual activity: Not on file  Lifestyle  . Physical activity    Days per week: Not on file    Minutes per session: Not on file  . Stress: Not on file  Relationships  . Social Herbalist on phone: Not on file    Gets together: Not on file    Attends religious service: Not on file    Active member of club or organization: Not on file    Attends meetings of clubs or organizations: Not on file    Relationship status: Not on file  . Intimate partner violence    Fear of current or ex partner: Not on file     Emotionally abused: Not on file    Physically abused: Not on file    Forced sexual activity: Not on file  Other Topics Concern  . Not on file  Social History Narrative  . Not on file    Past Medical History, Surgical history, Social history, and Family history were reviewed and updated as appropriate.   Please see review of systems for further details on the patient's review from today.   Objective:   Physical Exam:  There were no vitals taken for this visit.  Physical Exam Neurological:     Mental Status: She is alert and oriented to person, place, and time.     Cranial Nerves: No dysarthria.  Psychiatric:        Attention and Perception: Attention normal.        Mood and Affect: Mood is anxious. Mood is not depressed.        Speech: Speech normal.        Behavior: Behavior is cooperative.        Thought Content: Thought content normal. Thought content is not paranoid or delusional. Thought content does not include homicidal or suicidal ideation. Thought content does not include homicidal or suicidal plan.        Cognition and Memory: She exhibits impaired recent memory. She does not exhibit impaired remote memory.        Judgment: Judgment normal.     Comments: Insight and judgment appear good. Denies mania symptoms or severe depression irritable     Lab Review:  No results found for: NA, K, CL, CO2, GLUCOSE, BUN, CREATININE, CALCIUM, PROT, ALBUMIN, AST, ALT, ALKPHOS, BILITOT, GFRNONAA, GFRAA  No results found for: WBC, RBC, HGB, HCT, PLT, MCV, MCH, MCHC, RDW, LYMPHSABS, MONOABS, EOSABS, BASOSABS  No results found for: POCLITH, LITHIUM   No results found for: PHENYTOIN, PHENOBARB, VALPROATE, CBMZ   Took lab orders to PCP from visit in September and reports results: B12 >2000 Folate > 20 D 43.5 Iron TIBC 297 (250-450)  UIBC 156 (131-425)  Iron 141 (27-159)  Iron saturation 47 (15-55)   .res Assessment: Plan:    Bipolar  I disorder (HCC)  Generalized anxiety  disorder  Mixed obsessional thoughts and acts  Agoraphobia  MCI (mild cognitive impairment)  Insomnia due to mental condition  Low vitamin D level  Bipolar affective disorder, current episode hypomanic (HCC)   Greater than 50% of face to face time by phone with patient was spent on counseling and coordination of care. We discussed nature of her psychiatric diagnoses .  She needs a mood stabilizer of some sort but is wanting 1 with the lowest risk of developing dementia.  We discussed there is inherent increased risk of dementia associated with bipolar disorder.  While there is no proof of this the assumption is that generally the less sedating antipsychotics would presumably have less overall risk of cognitive problems both short-term and long-term.  The key is finding 1 that works for her.  She is failed all the major mood stabilizers that are not antipsychotics.  Bipolar disorder is stable back on Seroquel for the most part.  He has mild cycling failed attempt to wean Seroquel to Vraylar.    So far she is tolerated the increase increase in Latuda to 60 mg without side effects.  She has not noticed any marked mood changes.  Her anxiety remains under control.  She is having some irritability but no other manic symptoms.  Per her request in hopes of improving cognition she is weaned off of gabapentin.  Her sleep is a little worse but she expects that will get better with time from prior experience.  So far her cognition has not improved.  Option retry Abilify, Caplyta, Latuda.  Disc differences.  Failed multiple meds. She prefers not to try a a brand new med.  Wants to retry Latuda.  Disc with food.  OK prn lorazepam.  Caution re driving and drowsiness.  We discussed the short-term risks associated with benzodiazepines including sedation and increased fall risk among others.  Discussed long-term side effect risk including dependence, potential withdrawal symptoms, and the potential eventual  dose-related risk of dementia. OK continue Neurontin wean per her request but she has had no improvement in cognition as she is reduce the dose.  Discussed potential metabolic side effects associated with atypical antipsychotics, as well as potential risk for movement side effects. Advised pt to contact office if movement side effects occur.   Disc cognitive risks at length with meds and risks of untreated bipolar disorder for cognition as well.  Thyroid was normal a few months ago. B12 and folate are normal D is OK but goal with her should be 50s-60s Disc reasons in detail including Dr. Charm Barges recommendation.  Disc in detail what vitamin. Increase vitamin D from 5000 units daily to 10K units daily  Start Latuda  80 mg daily Start reduction in Seroquel next week and reduce once every 3 weeks. We discussed there is a risk of increased mood swings or depression with this med change as we do not do not know ahead of time if she is going to be responsive to [place].  There is also an increased risk of tardive dyskinesia with Latuda most likely as compared to Seroquel.  She wants to proceed anyway in hopes of improving cognition.  Greater than 50% of 30-minute phone to phone time with patient was spent on counseling and coordination of care.   FU 4-6 weeks  Meredith Staggers, MD, DFAPA   Please see After Visit Summary for patient specific instructions.  No future appointments.  No orders of the defined types were  placed in this encounter.     -------------------------------

## 2019-09-13 ENCOUNTER — Other Ambulatory Visit: Payer: Self-pay | Admitting: Psychiatry

## 2019-10-12 ENCOUNTER — Encounter: Payer: Self-pay | Admitting: Psychiatry

## 2019-10-12 ENCOUNTER — Other Ambulatory Visit: Payer: Self-pay

## 2019-10-12 ENCOUNTER — Other Ambulatory Visit: Payer: Self-pay | Admitting: Psychiatry

## 2019-10-12 ENCOUNTER — Ambulatory Visit (INDEPENDENT_AMBULATORY_CARE_PROVIDER_SITE_OTHER): Payer: Federal, State, Local not specified - PPO | Admitting: Psychiatry

## 2019-10-12 DIAGNOSIS — F319 Bipolar disorder, unspecified: Secondary | ICD-10-CM

## 2019-10-12 MED ORDER — QUETIAPINE FUMARATE 100 MG PO TABS: 200.0000 mg | ORAL_TABLET | Freq: Every day | ORAL | 0 refills | Status: DC

## 2019-10-12 NOTE — Progress Notes (Signed)
Kristen PapWinnie Leon [id number] Aug 16, 1966 53 y.o.  Virtual Visit via Telephone Note  I connected with pt by telephone and verified that I am speaking with the correct person using two identifiers.   I discussed the limitations, risks, security and privacy concerns of performing an evaluation and management service by telephone and the availability of in person appointments. I also discussed with the patient that there may be a patient responsible charge related to this service. The patient expressed understanding and agreed to proceed.  I discussed the assessment and treatment plan with the patient. The patient was provided an opportunity to ask questions and all were answered. The patient agreed with the plan and demonstrated an understanding of the instructions.   The patient was advised to call back or seek an in-person evaluation if the symptoms worsen or if the condition fails to improve as anticipated.  I provided 30 minutes of non-face-to-face time during this encounter. The call started at 1030 and ended at 1100. The patient was located at home and the provider was located office.  Subjective:   Patient ID:  Kristen PapWinnie Leon is a 53 y.o. (DOB Aug 16, 1966) female.  Chief Complaint:  Chief Complaint  Patient presents with  . Follow-up    Medication Management  . Other    Bipolar 1    Medication Refill Pertinent negatives include no weakness.     Kristen PapWinnie Leon presents  today for follow-up ofBipolar disorder, generalized anxiety disorder, nocturnal panic attacks, history of OCD, and mild cognitive impairment and worsening driving anxiety.  She has a goal of getting off the Seroquel in hopes of improving memory.  at visit August 06, 2019.  We needed to start a mood stabilizer and discussed variety of options and decided to start with Latuda.  We started at 20 mg and the plan was to increase gradually to 80 mg daily.  If that was successful this follow-up was going to lead to a potential  reduction in an attempt to wean Seroquel. Never got up to 80 mg Latuda.    Did not noticed much of a difference.  Last visit was September 03, 2019.  The following changes were made: Increase vitamin D from 5000 units daily to 10K units daily Start Latuda  80 mg daily Start reduction in Seroquel next week and reduce once every 3 weeks.  Reduced Seroquel from 600 to 200 mg HS.  At first had sleep and mood problems and angry.  Added in  gabapentin 300 mg total week ago and sleep better.  Able to sleep on her own.  Mood is better also.  Feels like less rapid cycling from normal to depressed to hypomanic.  Thinks Kasandra KnudsenLatuda is helping.  Was depressed a few weeks ago with Covid and isolated but not now.  Has kids half the time.  Anxiety better.  Work function is pretty good but she is dealing with the stress of the complaint by a coworker against her..  At ;times feels uptight esp when has kids.  Needs occ Ativan 1 mg.  0.5 mg doesn't help.  Trying to get rid of gabapentin and Seroquel for cognitive reasons primarily..  Other concern is focus and memory.  Starting to see a difference with less Seroquel.  Feels more in control.  Less word finding problems.  Struggle with simple things.  Ex trouble opening tissue box bc was trying to open the wrong side.  Not keeping track of time as well.  Wants to get off gabapentin and Seroquel  for that reason.  Feels the meds may increase risk of dementia.  tempazepam only 1 every other week.  Sleep 11 to 630.  Awakens and may stay awake 30-60 mins total.  Tried melatonin with some success in the past.  Her sleep is better with gabapentin but added back.  Wants refill Sonata.  Occ use.  Not needing either as much.  Mood pretty stable and better bc better relationship with kids.  A little anger but not much depression and no other sx of mania.  Anxiety is not good with Covid and gets anxious at night and has to get OOB.  Driving anxiety and sense unreality resolved.  Fine  with driving now.   When manic feels more reckless, spending, talk to herself, driven, reduced sleep, irritable and angry.   Past Psychiatric Medication Trials: Vraylar 3 SE, Abilify, brief Latuda ? effect, Depakote NR, carbamazepine questionable side effects, lamotrigine lost response, Trileptal, clozapine, olanzapine, lithium no response, mirtazapine, several SSRIs, gabapentin, clomipramine had vision side effects, Seroquel 600, risperidone 1 mg twice daily failed an attempt to switch from Seroquel to Susan Moore early 2020 No psych hosp.  Review of Systems:  Review of Systems  Neurological: Negative for dizziness, tremors and weakness.  Psychiatric/Behavioral: Negative for agitation, behavioral problems, confusion, decreased concentration, dysphoric mood, hallucinations, self-injury, sleep disturbance and suicidal ideas. The patient is not nervous/anxious and is not hyperactive.     Medications: I have reviewed the patient's current medications.  Current Outpatient Medications  Medication Sig Dispense Refill  . Acetylcysteine (NAC) 600 MG CAPS Take 1 capsule by mouth daily.    Marland Kitchen atenolol (TENORMIN) 50 MG tablet Take 50 mg by mouth daily.    . B Complex Vitamins (VITAMIN B-COMPLEX) TABS Take by mouth.    . Cholecalciferol (VITAMIN D3) 5000 units CAPS Take 1 capsule by mouth daily.    Marland Kitchen gabapentin (NEURONTIN) 600 MG tablet Take 300 mg by mouth at bedtime.    Marland Kitchen levothyroxine (SYNTHROID, LEVOTHROID) 112 MCG tablet Take 112 mcg by mouth daily before breakfast.    . linaclotide (LINZESS) 290 MCG CAPS capsule TAKE 1 CAPSULE DAILY    . lithium carbonate 150 MG capsule TAKE 1 CAPSULE EVERY       EVENING 90 capsule 0  . LORazepam (ATIVAN) 0.5 MG tablet Take 1-2 tablets (0.5-1 mg total) by mouth every 8 (eight) hours as needed for anxiety. 30 tablet 0  . lurasidone (LATUDA) 80 MG TABS tablet Take 1 tablet (80 mg total) by mouth daily with breakfast. 90 tablet 0  . memantine (NAMENDA) 10 MG tablet  TAKE 1 TABLET TWICE A DAY 180 tablet 0  . mirtazapine (REMERON) 30 MG tablet TAKE 1 TABLET AT BEDTIME 90 tablet 1  . QUEtiapine (SEROQUEL) 200 MG tablet TAKE 3 TABLETS AT BEDTIME 270 tablet 1  . temazepam (RESTORIL) 15 MG capsule at bedtime as needed.    . zaleplon (SONATA) 10 MG capsule Take 1 capsule (10 mg total) by mouth at bedtime as needed for sleep. 30 capsule 0   No current facility-administered medications for this visit.     Medication Side Effects: None  Allergies: No Known Allergies  History reviewed. No pertinent past medical history.  History reviewed. No pertinent family history.  Social History   Socioeconomic History  . Marital status: Divorced    Spouse name: Not on file  . Number of children: Not on file  . Years of education: Not on file  . Highest education level: Not  on file  Occupational History  . Not on file  Social Needs  . Financial resource strain: Not on file  . Food insecurity    Worry: Not on file    Inability: Not on file  . Transportation needs    Medical: Not on file    Non-medical: Not on file  Tobacco Use  . Smoking status: Former Smoker    Quit date: 2000    Years since quitting: 20.8  . Smokeless tobacco: Never Used  Substance and Sexual Activity  . Alcohol use: Not on file  . Drug use: Not on file  . Sexual activity: Not on file  Lifestyle  . Physical activity    Days per week: Not on file    Minutes per session: Not on file  . Stress: Not on file  Relationships  . Social Musician on phone: Not on file    Gets together: Not on file    Attends religious service: Not on file    Active member of club or organization: Not on file    Attends meetings of clubs or organizations: Not on file    Relationship status: Not on file  . Intimate partner violence    Fear of current or ex partner: Not on file    Emotionally abused: Not on file    Physically abused: Not on file    Forced sexual activity: Not on file  Other  Topics Concern  . Not on file  Social History Narrative  . Not on file    Past Medical History, Surgical history, Social history, and Family history were reviewed and updated as appropriate.   Please see review of systems for further details on the patient's review from today.   Objective:   Physical Exam:  There were no vitals taken for this visit.  Physical Exam Neurological:     Mental Status: She is alert and oriented to person, place, and time.     Cranial Nerves: No dysarthria.  Psychiatric:        Attention and Perception: Attention normal.        Mood and Affect: Mood is anxious. Mood is not depressed.        Speech: Speech normal.        Behavior: Behavior is cooperative.        Thought Content: Thought content normal. Thought content is not paranoid or delusional. Thought content does not include homicidal or suicidal ideation. Thought content does not include homicidal or suicidal plan.        Cognition and Memory: She exhibits impaired recent memory. She does not exhibit impaired remote memory.        Judgment: Judgment normal.     Comments: Insight and judgment appear good. Denies mania symptoms or severe depression irritable.  She is also noticing less mood cycling.  Still has intermittent anxiety under stress.     Lab Review:  No results found for: NA, K, CL, CO2, GLUCOSE, BUN, CREATININE, CALCIUM, PROT, ALBUMIN, AST, ALT, ALKPHOS, BILITOT, GFRNONAA, GFRAA  No results found for: WBC, RBC, HGB, HCT, PLT, MCV, MCH, MCHC, RDW, LYMPHSABS, MONOABS, EOSABS, BASOSABS  No results found for: POCLITH, LITHIUM   No results found for: PHENYTOIN, PHENOBARB, VALPROATE, CBMZ   Took lab orders to PCP from visit in September and reports results: B12 >2000 Folate > 20 D 43.5 Iron TIBC 297 (250-450)  UIBC 156 (131-425)  Iron 141 (27-159)  Iron saturation 47 (15-55)   .  res Assessment: Plan:    No diagnosis found.   Greater than 50% of face to face time by phone  with patient was spent on counseling and coordination of care. We discussed nature of her psychiatric diagnoses .  She needs a mood stabilizer of some sort but is wanting 1 with the lowest risk of developing dementia.  We discussed there is inherent increased risk of dementia associated with bipolar disorder.  While there is no proof of this the assumption is that generally the less sedating antipsychotics would presumably have less overall risk of cognitive problems both short-term and long-term.  The key is finding 1 that works for her.  She is failed all the major mood stabilizers that are not antipsychotics.   He has mild cycling failed attempt to wean Seroquel to Vraylar.   Bipolar disorder is improved so far on Latuda 80 mg daily.  She is tolerated the reduction in Seroquel to 200 mg nightly.  She wants to go further and I concur.  Option retry Abilify, Caplyta, Latuda.  Disc differences.  Failed multiple meds. She prefers not to try a a brand new med.  Wants to retry Latuda.  Disc with food.  OK prn lorazepam.  Caution re driving and drowsiness.  We discussed the short-term risks associated with benzodiazepines including sedation and increased fall risk among others.  Discussed long-term side effect risk including dependence, potential withdrawal symptoms, and the potential eventual dose-related risk of dementia.  No reduction in Neurontin bc it has helped.  wean per her request but she has had no improvement in cognition as she is reduce the dose.  Discussed potential metabolic side effects associated with atypical antipsychotics, as well as potential risk for movement side effects. Advised pt to contact office if movement side effects occur.   Disc cognitive risks at length with meds and risks of untreated bipolar disorder for cognition as well.  She is taking Namenda 10 mg twice daily off label for help with this symptom.  Thyroid was normal a few months ago. B12 and folate are normal D is  OK but goal with her should be 50s-60s Disc reasons in detail including Dr. Charm Barges recommendation.  Disc in detail what vitamin. Increase vitamin D from 5000 units daily to 10K units daily  Continue Latuda  80 mg daily Continue gradual reduction in Seroquel next week and reduce once every 3 to 4 weeks by 50 mg. We discussed there is a risk of increased mood swings or depression with this med change as we do not do not know ahead of time if she is going to be responsive to [place].  There is also an increased risk of tardive dyskinesia with Latuda most likely as compared to Seroquel.  She wants to proceed anyway in hopes of improving cognition.  Greater than 50% of 30-minute phone to phone time with patient was spent on counseling and coordination of care.   FU 8 weeks  Meredith Staggers, MD, DFAPA   Please see After Visit Summary for patient specific instructions.  No future appointments.  No orders of the defined types were placed in this encounter.     -------------------------------

## 2019-11-23 ENCOUNTER — Other Ambulatory Visit: Payer: Self-pay | Admitting: Psychiatry

## 2019-11-24 ENCOUNTER — Other Ambulatory Visit: Payer: Self-pay | Admitting: Psychiatry

## 2019-12-16 ENCOUNTER — Encounter: Payer: Self-pay | Admitting: Psychiatry

## 2019-12-16 ENCOUNTER — Ambulatory Visit (INDEPENDENT_AMBULATORY_CARE_PROVIDER_SITE_OTHER): Payer: Federal, State, Local not specified - PPO | Admitting: Psychiatry

## 2019-12-16 DIAGNOSIS — F319 Bipolar disorder, unspecified: Secondary | ICD-10-CM | POA: Diagnosis not present

## 2019-12-16 DIAGNOSIS — F411 Generalized anxiety disorder: Secondary | ICD-10-CM | POA: Diagnosis not present

## 2019-12-16 DIAGNOSIS — R7989 Other specified abnormal findings of blood chemistry: Secondary | ICD-10-CM

## 2019-12-16 DIAGNOSIS — F5105 Insomnia due to other mental disorder: Secondary | ICD-10-CM

## 2019-12-16 DIAGNOSIS — F422 Mixed obsessional thoughts and acts: Secondary | ICD-10-CM | POA: Diagnosis not present

## 2019-12-16 DIAGNOSIS — F4000 Agoraphobia, unspecified: Secondary | ICD-10-CM | POA: Diagnosis not present

## 2019-12-16 DIAGNOSIS — G3184 Mild cognitive impairment, so stated: Secondary | ICD-10-CM

## 2019-12-16 NOTE — Progress Notes (Signed)
Kristen Leon [id number] Oct 13, 1966 54 y.o.  Virtual Visit via Telephone Note  I connected with pt by telephone and verified that I am speaking with the correct person using two identifiers.   I discussed the limitations, risks, security and privacy concerns of performing an evaluation and management service by telephone and the availability of in person appointments. I also discussed with the patient that there may be a patient responsible charge related to this service. The patient expressed understanding and agreed to proceed.  I discussed the assessment and treatment plan with the patient. The patient was provided an opportunity to ask questions and all were answered. The patient agreed with the plan and demonstrated an understanding of the instructions.   The patient was advised to call back or seek an in-person evaluation if the symptoms worsen or if the condition fails to improve as anticipated.  I provided 30 minutes of non-face-to-face time during this encounter. The call started at 1030 and ended at 1100. The patient was located at home and the provider was located office.  Subjective:   Patient ID:  Kristen Leon is a 54 y.o. (DOB Oct 14, 1966) female.  Chief Complaint:  Chief Complaint  Patient presents with  . Follow-up    Medication Management  . Other    Medication Management    Medication Refill Pertinent negatives include no weakness.     Kristen Leon presents  today for follow-up ofBipolar disorder, generalized anxiety disorder, nocturnal panic attacks, history of OCD, and mild cognitive impairment and worsening driving anxiety.  She has a goal of getting off the Seroquel in hopes of improving memory.  at visit August 06, 2019.  We needed to start a mood stabilizer and discussed variety of options and decided to start with Latuda.  We started at 20 mg and the plan was to increase gradually to 80 mg daily.  If that was successful this follow-up was going to lead to a  potential reduction in an attempt to wean Seroquel. Never got up to 80 mg Latuda.    Did not noticed much of a difference.  visit September 03, 2019.  The following changes were made: Increase vitamin D from 5000 units daily to 10K units daily Start Latuda  80 mg daily Start reduction in Seroquel next week and reduce once every 3 weeks. Reduced Seroquel from 600 to 200 mg HS.  At first had sleep and mood problems and angry.  Added in  gabapentin 300 mg total week ago and sleep better.  Able to sleep on her own.  Mood is better also.  Last visit October 12, 2019.  The following changes were made: Increase vitamin D from 5000 units daily to 10K units daily Continue Latuda  80 mg daily Continue gradual reduction in Seroquel next week and reduce once every 3 to 4 weeks by 50 mg. Trying to get rid of gabapentin and Seroquel for cognitive reasons primarily  Down to Seroquel 150 HS for 3 weeks.  Quality of sleepy slightly off.  How long on this dose?  Tends to wake 30 min earlier than usual.  Bought magnesium.    Feels fine in the day.  Slightly less harried and mentally cloudy with reduction in Seroquel.  Likes the [place] as mood medication.   Overall mood and anxiety are under control.  Other concern is focus and memory. Wants to get off gabapentin and Seroquel for that reason.  Feels the meds may increase risk of dementia.  Overall she has seen  some cognitive improvement since the reduction in Seroquel as noted.  tempazepam only rarely.  Mood pretty stable and better bc better relationship with kids.  A little anger but not much depression and no other sx of mania.    Driving anxiety and sense unreality resolved.  Fine with driving now.   When manic feels more reckless, spending, talk to herself, driven, reduced sleep, irritable and angry.   Past Psychiatric Medication Trials: Vraylar 3 SE, Abilify, brief Latuda ? effect, Depakote NR, carbamazepine questionable side effects, lamotrigine  lost response, Trileptal, clozapine, olanzapine, lithium no response, mirtazapine, several SSRIs, gabapentin, clomipramine had vision side effects, Seroquel 600, risperidone 1 mg twice daily failed an attempt to switch from Seroquel to [place] early 2020 No psych hosp.  Review of Systems:  Review of Systems  Neurological: Negative for dizziness, tremors and weakness.  Psychiatric/Behavioral: Negative for agitation, behavioral problems, confusion, decreased concentration, dysphoric mood, hallucinations, self-injury, sleep disturbance and suicidal ideas. The patient is not nervous/anxious and is not hyperactive.     Medications: I have reviewed the patient's current medications.  Current Outpatient Medications  Medication Sig Dispense Refill  . Acetylcysteine (NAC) 600 MG CAPS Take 1 capsule by mouth daily.    Marland Kitchen atenolol (TENORMIN) 50 MG tablet Take 50 mg by mouth daily.    . B Complex Vitamins (VITAMIN B-COMPLEX) TABS Take by mouth.    . Cholecalciferol (VITAMIN D3) 5000 units CAPS Take 1 capsule by mouth daily.    Marland Kitchen gabapentin (NEURONTIN) 600 MG tablet Take 300 mg by mouth at bedtime.    Marland Kitchen LATUDA 80 MG TABS tablet TAKE 1 TABLET DAILY WITH   BREAKFAST 90 tablet 0  . levothyroxine (SYNTHROID, LEVOTHROID) 112 MCG tablet Take 112 mcg by mouth daily before breakfast.    . linaclotide (LINZESS) 290 MCG CAPS capsule TAKE 1 CAPSULE DAILY    . lithium carbonate 150 MG capsule TAKE 1 CAPSULE EVERY       EVENING 90 capsule 0  . LORazepam (ATIVAN) 0.5 MG tablet Take 1-2 tablets (0.5-1 mg total) by mouth every 8 (eight) hours as needed for anxiety. 30 tablet 0  . memantine (NAMENDA) 10 MG tablet TAKE 1 TABLET TWICE A DAY (Patient taking differently: Take 10 mg by mouth daily. ) 180 tablet 0  . mirtazapine (REMERON) 30 MG tablet TAKE 1 TABLET AT BEDTIME 90 tablet 1  . QUEtiapine (SEROQUEL) 100 MG tablet Take 2 tablets (200 mg total) by mouth at bedtime. (Patient taking differently: Take 150 mg by mouth  at bedtime. ) 180 tablet 0  . temazepam (RESTORIL) 15 MG capsule at bedtime as needed.    . zaleplon (SONATA) 10 MG capsule Take 1 capsule (10 mg total) by mouth at bedtime as needed for sleep. 30 capsule 0   No current facility-administered medications for this visit.    Medication Side Effects: None  Allergies: No Known Allergies  History reviewed. No pertinent past medical history.  History reviewed. No pertinent family history.  Social History   Socioeconomic History  . Marital status: Divorced    Spouse name: Not on file  . Number of children: Not on file  . Years of education: Not on file  . Highest education level: Not on file  Occupational History  . Not on file  Tobacco Use  . Smoking status: Former Smoker    Quit date: 2000    Years since quitting: 21.0  . Smokeless tobacco: Never Used  Substance and Sexual Activity  .  Alcohol use: Not on file  . Drug use: Not on file  . Sexual activity: Not on file  Other Topics Concern  . Not on file  Social History Narrative  . Not on file   Social Determinants of Health   Financial Resource Strain:   . Difficulty of Paying Living Expenses: Not on file  Food Insecurity:   . Worried About Programme researcher, broadcasting/film/video in the Last Year: Not on file  . Ran Out of Food in the Last Year: Not on file  Transportation Needs:   . Lack of Transportation (Medical): Not on file  . Lack of Transportation (Non-Medical): Not on file  Physical Activity:   . Days of Exercise per Week: Not on file  . Minutes of Exercise per Session: Not on file  Stress:   . Feeling of Stress : Not on file  Social Connections:   . Frequency of Communication with Friends and Family: Not on file  . Frequency of Social Gatherings with Friends and Family: Not on file  . Attends Religious Services: Not on file  . Active Member of Clubs or Organizations: Not on file  . Attends Banker Meetings: Not on file  . Marital Status: Not on file  Intimate  Partner Violence:   . Fear of Current or Ex-Partner: Not on file  . Emotionally Abused: Not on file  . Physically Abused: Not on file  . Sexually Abused: Not on file    Past Medical History, Surgical history, Social history, and Family history were reviewed and updated as appropriate.   Please see review of systems for further details on the patient's review from today.   Objective:   Physical Exam:  There were no vitals taken for this visit.  Physical Exam Neurological:     Mental Status: She is alert and oriented to person, place, and time.     Cranial Nerves: No dysarthria.  Psychiatric:        Attention and Perception: Attention normal.        Mood and Affect: Mood is anxious. Mood is not depressed.        Speech: Speech normal.        Behavior: Behavior is cooperative.        Thought Content: Thought content normal. Thought content is not paranoid or delusional. Thought content does not include homicidal or suicidal ideation. Thought content does not include homicidal or suicidal plan.        Cognition and Memory: She exhibits impaired recent memory. She does not exhibit impaired remote memory.        Judgment: Judgment normal.     Comments: Insight and judgment appear good. Denies mania symptoms or severe depression irritable.  She is also noticing less mood cycling.  Still has intermittent anxiety under stress.     Lab Review:  No results found for: NA, K, CL, CO2, GLUCOSE, BUN, CREATININE, CALCIUM, PROT, ALBUMIN, AST, ALT, ALKPHOS, BILITOT, GFRNONAA, GFRAA  No results found for: WBC, RBC, HGB, HCT, PLT, MCV, MCH, MCHC, RDW, LYMPHSABS, MONOABS, EOSABS, BASOSABS  No results found for: POCLITH, LITHIUM   No results found for: PHENYTOIN, PHENOBARB, VALPROATE, CBMZ   Took lab orders to PCP from visit in September and reports results: B12 >2000 Folate > 20 D 43.5 Iron TIBC 297 (250-450)  UIBC 156 (131-425)  Iron 141 (27-159)  Iron saturation 47  (15-55)   .res Assessment: Plan:    Bipolar I disorder (HCC)  Generalized anxiety disorder  Mixed obsessional thoughts and acts  Agoraphobia  MCI (mild cognitive impairment)  Insomnia due to mental condition  Low vitamin D level   Greater than 50% of 30-minute non-face to face time by phone with patient was spent on counseling and coordination of care. We discussed nature of her psychiatric diagnoses .    Bipolar disorder is improved so far on Latuda 80 mg daily.  She is tolerated the reduction in Seroquel to 150 mg nightly.  She wants to go further and I concur.  Option retry Abilify, Cap[place], if necessary but she is having good response to Latuda at this time.  Disc differences.  Failed multiple meds.  OK prn lorazepam.  Caution re driving and drowsiness.  We discussed the short-term risks associated with benzodiazepines including sedation and increased fall risk among others.  Discussed long-term side effect risk including dependence, potential withdrawal symptoms, and the potential eventual dose-related risk of dementia.  No reduction in Neurontin bc it has helped.  He is done fine with reduction to 300 mg nightly.  She is focusing on trying to reduce and eliminate the Seroquel at this time.  Discussed potential metabolic side effects associated with atypical antipsychotics, as well as potential risk for movement side effects. Advised pt to contact office if movement side effects occur.   Disc cognitive risks at length with meds and risks of untreated bipolar disorder for cognition as well.  She is taking Namenda 10 mg twice daily off label for help with this symptom.  Thyroid was normal a few months ago. B12 and folate are normal D is OK but goal with her should be 50s-60s Disc reasons in detail including Dr. Charm Barges recommendation.  Disc in detail what vitamin. Continue vitamin D from 5000 units daily to 10K units daily  Continue Latuda  80 mg daily Continue gradual  reduction in Seroquel next week and reduce once every 3 to 4 weeks by 50 mg. We discussed there is a risk of increased mood swings or depression with this med change as we do not do not know ahead of time if she is going to be responsive to [place].  There is also an increased risk of tardive dyskinesia with Latuda most likely as compared to Seroquel.  At the lower doses of Seroquel sometimes patients will have difficulty dropping further because of insomnia that occurs.  Discussed the importance of protecting her sleep for overall mood stability.  The risks of very low dosages such as 50-100 mg of Seroquel are likely very minimal and specifically minimal risk of EPS, weight gain or daytime cognitive problems.  Greater than 50% of 30-minute phone to phone time with patient was spent on counseling and coordination of care.   FU 3 months  Meredith Staggers, MD, DFAPA   Please see After Visit Summary for patient specific instructions.  No future appointments.  No orders of the defined types were placed in this encounter.     -------------------------------

## 2019-12-19 ENCOUNTER — Other Ambulatory Visit: Payer: Self-pay | Admitting: Psychiatry

## 2019-12-19 DIAGNOSIS — F319 Bipolar disorder, unspecified: Secondary | ICD-10-CM

## 2019-12-21 NOTE — Telephone Encounter (Signed)
Ask patient dose of her seroquel since tapering

## 2020-01-09 ENCOUNTER — Other Ambulatory Visit: Payer: Self-pay | Admitting: Psychiatry

## 2020-01-31 ENCOUNTER — Other Ambulatory Visit: Payer: Self-pay | Admitting: Psychiatry

## 2020-02-05 ENCOUNTER — Other Ambulatory Visit: Payer: Self-pay | Admitting: Psychiatry

## 2020-02-08 NOTE — Telephone Encounter (Signed)
Change Rx? Patient taking 1/day

## 2020-03-02 ENCOUNTER — Other Ambulatory Visit: Payer: Self-pay | Admitting: Psychiatry

## 2020-03-14 ENCOUNTER — Encounter: Payer: Self-pay | Admitting: Psychiatry

## 2020-03-14 ENCOUNTER — Ambulatory Visit (INDEPENDENT_AMBULATORY_CARE_PROVIDER_SITE_OTHER): Payer: Federal, State, Local not specified - PPO | Admitting: Psychiatry

## 2020-03-14 DIAGNOSIS — R7989 Other specified abnormal findings of blood chemistry: Secondary | ICD-10-CM

## 2020-03-14 DIAGNOSIS — F411 Generalized anxiety disorder: Secondary | ICD-10-CM | POA: Diagnosis not present

## 2020-03-14 DIAGNOSIS — F422 Mixed obsessional thoughts and acts: Secondary | ICD-10-CM | POA: Diagnosis not present

## 2020-03-14 DIAGNOSIS — F4000 Agoraphobia, unspecified: Secondary | ICD-10-CM

## 2020-03-14 DIAGNOSIS — F319 Bipolar disorder, unspecified: Secondary | ICD-10-CM

## 2020-03-14 DIAGNOSIS — F5105 Insomnia due to other mental disorder: Secondary | ICD-10-CM

## 2020-03-14 DIAGNOSIS — G3184 Mild cognitive impairment, so stated: Secondary | ICD-10-CM

## 2020-03-14 MED ORDER — ASENAPINE MALEATE 5 MG SL SUBL: SUBLINGUAL_TABLET | SUBLINGUAL | 1 refills | Status: DC

## 2020-03-14 NOTE — Progress Notes (Signed)
Kristen Leon [id number] November 01, 1966 54 y.o.  Virtual Visit via WebEX  I connected with pt by WebEx and verified that I am speaking with the correct person using two identifiers.   I discussed the limitations, risks, security and privacy concerns of performing an evaluation and management service by Kristen Leon and the availability of in person appointments. I also discussed with the patient that there may be a patient responsible charge related to this service. The patient expressed understanding and agreed to proceed.  I discussed the assessment and treatment plan with the patient. The patient was provided an opportunity to ask questions and all were answered. The patient agreed with the plan and demonstrated an understanding of the instructions.   The patient was advised to call back or seek an in-person evaluation if the symptoms worsen or if the condition fails to improve as anticipated.  I provided 30 minutes of video time during this encounter. The call started at 1100 and ended at 11:30. The patient was located at home and the provider was located office.  Subjective:   Patient ID:  Kristen Leon is a 54 y.o. (DOB 1966/11/11) female.  Chief Complaint:  Chief Complaint  Patient presents with  . Follow-up    Psychiatric med management  . Cognitive complaints  . Anxiety  . Sleeping Problem  . Medication Problem    Medication Refill Pertinent negatives include no weakness.     Kristen Leon presents  today for follow-up ofBipolar disorder, generalized anxiety disorder, nocturnal panic attacks, history of OCD, and mild cognitive impairment and worsening driving anxiety.  She has a goal of getting off the Seroquel in hopes of improving memory.  at visit August 06, 2019.  We needed to start a mood stabilizer and discussed variety of options and decided to start with Latuda.  We started at 20 mg and the plan was to increase gradually to 80 mg daily.  If that was successful this follow-up  was going to lead to a potential reduction in an attempt to wean Seroquel. Never got up to 80 mg Latuda.    Did not noticed much of a difference.  visit September 03, 2019.  The following changes were made: Increase vitamin D from 5000 units daily to 10K units daily Start Latuda  80 mg daily Start reduction in Seroquel next week and reduce once every 3 weeks. Reduced Seroquel from 600 to 200 mg HS.  At first had sleep and mood problems and angry.  Added in  gabapentin 300 mg total week ago and sleep better.  Able to sleep on her own.  Mood is better also.  visit October 12, 2019.  The following changes were made: Increase vitamin D from 5000 units daily to 10K units daily Continue Latuda  80 mg daily Continue gradual reduction in Seroquel next week and reduce once every 3 to 4 weeks by 50 mg. Trying to get rid of gabapentin and Seroquel for cognitive reasons primarily  Last seen December 16, 2019.  The following was noted: Down to Seroquel 150 HS for 3 weeks.  Quality of sleepy slightly off.  How long on this dose?  Tends to wake 30 min earlier than usual.  Bought magnesium.   Feels fine in the day.  Slightly less harried and mentally cloudy with reduction in Seroquel. Likes the Kristen Leon as mood medication.   Overall mood and anxiety are under control.  Other concern is focus and memory. Wants to get off gabapentin and Seroquel for that reason.  Feels the meds may increase risk of dementia.  Overall she has seen some cognitive improvement since the reduction in Seroquel as noted. She was in courage to try reducing the Seroquel further gradually.  She was encouraged to leave the gabapentin at 300 mg daily because it was helpful.  Couldn't sleep without Seroquel 200 mg.  Felt unstable and anxious.  For weeks felt she still struggled at 200 mg Seroquel and raised the dose all the way back up to where she started bc felt desperate and was on edge.   Early 2000's not on Seroquel and had a lot of  anxiety and obsessive thought.  It helps anxiety.  Mood better on Seroquel and feels better physically and mentally.  A little down with stress and failure of switch to [place].  little anger but not much depression and no other sx of mania.    Driving anxiety and sense unreality resolved.  Fine with driving now.   When manic feels more reckless, spending, talk to herself, driven, reduced sleep, irritable and angry.   Past Psychiatric Medication Trials: Vraylar 3 SE, Abilify, brief Latuda ? effect, Depakote NR, carbamazepine questionable side effects, lamotrigine lost response, Trileptal, clozapine, olanzapine, lithium no response, mirtazapine, Seroquel 600, risperidone 1 mg twice daily  several SSRIs, clomipramine had vision side effects,  gabapentin, failed an attempt to switch from Seroquel to Kristen Leon early 2020  Failed switch from Seroquel to Kristen Leon No psych hosp.  Review of Systems:  Review of Systems  Cardiovascular: Negative for palpitations.  Neurological: Negative for dizziness, tremors and weakness.  Psychiatric/Behavioral: Negative for agitation, behavioral problems, confusion, decreased concentration, dysphoric mood, hallucinations, self-injury, sleep disturbance and suicidal ideas. The patient is not nervous/anxious and is not hyperactive.     Medications: I have reviewed the patient's current medications.  Current Outpatient Medications  Medication Sig Dispense Refill  . Acetylcysteine (NAC) 600 MG CAPS Take 1 capsule by mouth daily.    Marland Kitchen atenolol (TENORMIN) 50 MG tablet Take 50 mg by mouth daily.    . B Complex Vitamins (VITAMIN B-COMPLEX) TABS Take by mouth.    . Cholecalciferol (VITAMIN D3) 5000 units CAPS Take 1 capsule by mouth daily.    Marland Kitchen gabapentin (NEURONTIN) 600 MG tablet Take 300 mg by mouth at bedtime.    Marland Kitchen LATUDA 80 MG TABS tablet TAKE 1 TABLET DAILY WITH   BREAKFAST 90 tablet 0  . levothyroxine (SYNTHROID, LEVOTHROID) 112 MCG tablet Take 112 mcg by mouth  daily before breakfast.    . linaclotide (LINZESS) 290 MCG CAPS capsule TAKE 1 CAPSULE DAILY    . lithium carbonate 150 MG capsule TAKE 1 CAPSULE EVERY       EVENING 90 capsule 0  . LORazepam (ATIVAN) 0.5 MG tablet Take 1-2 tablets (0.5-1 mg total) by mouth every 8 (eight) hours as needed for anxiety. 30 tablet 0  . memantine (NAMENDA) 10 MG tablet TAKE 1 TABLET TWICE A DAY 180 tablet 0  . mirtazapine (REMERON) 30 MG tablet TAKE 1 TABLET AT BEDTIME 90 tablet 1  . QUEtiapine (SEROQUEL) 100 MG tablet Take 1.5 tablets (150 mg total) by mouth at bedtime. (Patient taking differently: Take 600 mg by mouth at bedtime. Within the last week) 135 tablet 1  . temazepam (RESTORIL) 15 MG capsule at bedtime as needed.    . zaleplon (SONATA) 10 MG capsule Take 1 capsule (10 mg total) by mouth at bedtime as needed for sleep. 30 capsule 0  . asenapine (SAPHRIS) 5 MG  SUBL 24 hr tablet 1 SL in AM and 2 tablets SL at night 90 tablet 1   No current facility-administered medications for this visit.    Medication Side Effects: None  Allergies: No Known Allergies  History reviewed. No pertinent past medical history.  History reviewed. No pertinent family history.  Social History   Socioeconomic History  . Marital status: Divorced    Spouse name: Not on file  . Number of children: Not on file  . Years of education: Not on file  . Highest education level: Not on file  Occupational History  . Not on file  Tobacco Use  . Smoking status: Former Smoker    Quit date: 2000    Years since quitting: 21.2  . Smokeless tobacco: Never Used  Substance and Sexual Activity  . Alcohol use: Not on file  . Drug use: Not on file  . Sexual activity: Not on file  Other Topics Concern  . Not on file  Social History Narrative  . Not on file   Social Determinants of Health   Financial Resource Strain:   . Difficulty of Paying Living Expenses:   Food Insecurity:   . Worried About Programme researcher, broadcasting/film/video in the Last  Year:   . Barista in the Last Year:   Transportation Needs:   . Freight forwarder (Medical):   Marland Kitchen Lack of Transportation (Non-Medical):   Physical Activity:   . Days of Exercise per Week:   . Minutes of Exercise per Session:   Stress:   . Feeling of Stress :   Social Connections:   . Frequency of Communication with Friends and Family:   . Frequency of Social Gatherings with Friends and Family:   . Attends Religious Services:   . Active Member of Clubs or Organizations:   . Attends Banker Meetings:   Marland Kitchen Marital Status:   Intimate Partner Violence:   . Fear of Current or Ex-Partner:   . Emotionally Abused:   Marland Kitchen Physically Abused:   . Sexually Abused:     Past Medical History, Surgical history, Social history, and Family history were reviewed and updated as appropriate.   Please see review of systems for further details on the patient's review from today.   Objective:   Physical Exam:  There were no vitals taken for this visit.  Physical Exam Neurological:     Mental Status: She is alert and oriented to person, place, and time.     Cranial Nerves: No dysarthria.  Psychiatric:        Attention and Perception: Attention normal.        Mood and Affect: Mood is anxious. Mood is not depressed.        Speech: Speech normal. Speech is not rapid and pressured.        Behavior: Behavior is cooperative.        Thought Content: Thought content normal. Thought content is not paranoid or delusional. Thought content does not include homicidal or suicidal ideation. Thought content does not include homicidal or suicidal plan.        Cognition and Memory: She exhibits impaired recent memory. She does not exhibit impaired remote memory.        Judgment: Judgment normal.     Comments: Insight and judgment appear good. Denies mania symptoms or severe depression irritable.  She is also noticing less mood cycling.  Still has intermittent anxiety under stress.     Lab  Review:  No results found for: NA, K, CL, CO2, GLUCOSE, BUN, CREATININE, CALCIUM, PROT, ALBUMIN, AST, ALT, ALKPHOS, BILITOT, GFRNONAA, GFRAA  No results found for: WBC, RBC, HGB, HCT, PLT, MCV, MCH, MCHC, RDW, LYMPHSABS, MONOABS, EOSABS, BASOSABS  No results found for: POCLITH, LITHIUM   No results found for: PHENYTOIN, PHENOBARB, VALPROATE, CBMZ   Took lab orders to PCP from visit in September and reports results: B12 >2000 Folate > 20 D 43.5 Iron TIBC 297 (250-450)  UIBC 156 (131-425)  Iron 141 (27-159)  Iron saturation 47 (15-55)   .res Assessment: Plan:    Bipolar I disorder (HCC) - Plan: asenapine (SAPHRIS) 5 MG SUBL 24 hr tablet  Generalized anxiety disorder  Mixed obsessional thoughts and acts  Agoraphobia  MCI (mild cognitive impairment)  Insomnia due to mental condition  Low vitamin D level   Greater than 50% of 30-minute non-face to face time by phone with patient was spent on counseling and coordination of care. We discussed nature of her psychiatric diagnoses .    Bipolar disorder Was worse with reduction in Seroquel. So failed transition to JordanLatuda.  She could not sleep when she reduced the Seroquel doses and found that intolerable.  Her mood also worsened as well as her anxiety.  She wants to know if there are options for treatment where she does not have to take medication.  This was discussed in detail there is no realistic option to treat bipolar disorder with out medication.  We will continue to read that try to reduce the number of medications.  Addressed her ambivalence about medication.  Her quality of life without medication is inadequate.  Option retry Abilify, Caplyta, loxapine, Saphris if necessary.  Disc differences.  Failed multiple meds.  OK prn lorazepam.  Caution re driving and drowsiness.  We discussed the short-term risks associated with benzodiazepines including sedation and increased fall risk among others.  Discussed long-term side effect  risk including dependence, potential withdrawal symptoms, and the potential eventual dose-related risk of dementia.  No reduction in Neurontin bc it has helped.  She has done fine with reduction to 300 mg nightly.  She is focusing on trying to reduce and eliminate the Seroquel at this time.  Discussed potential metabolic side effects associated with atypical antipsychotics, as well as potential risk for movement side effects. Advised pt to contact office if movement side effects occur.   Disc cognitive risks at length with meds and risks of untreated bipolar disorder for cognition as well.  She is taking Namenda 10 mg twice daily off label for help with this symptom.  Thyroid was normal a few months ago. B12 and folate are normal D is OK but goal with her should be 50s-60s Disc reasons in detail including Dr. Charm BargesLapp's recommendation.  Disc in detail what vitamin. Continue vitamin D from 5000 units daily to 10K units daily  Wean Latuda to 40 mg for a week and then stop it.  Then start Saphris 5mg  HS and reduce Seroquel 400 mg at night for 5 days, then increase Saphris 10 mg at night and reduce Seroquel to 200 mg at night for 5 days, then increase Saphris to 15 HS and stop sEroquel.  We discussed there is a risk of increased mood swings or depression with this med change as we do not do not know ahead of time if she is going to be responsive to Saphris.  .  At the lower doses of Seroquel sometimes patients will have difficulty dropping further because  of insomnia that occurs which is the reason we selected Saphris because it is typically effective for sleep..  Discussed the importance of protecting her sleep for overall mood stability.  The risks of very low dosages such as 50-100 mg of Seroquel are likely very minimal and specifically minimal risk of EPS, weight gain or daytime cognitive problems.  Greater than 50% of 30-minute phone to phone time with patient was spent on counseling and coordination  of care.   FU 1 months  Meredith Staggers, MD, DFAPA   Please see After Visit Summary for patient specific instructions.  No future appointments.  No orders of the defined types were placed in this encounter.     -------------------------------

## 2020-04-12 ENCOUNTER — Telehealth (INDEPENDENT_AMBULATORY_CARE_PROVIDER_SITE_OTHER): Payer: Federal, State, Local not specified - PPO | Admitting: Psychiatry

## 2020-04-12 ENCOUNTER — Encounter: Payer: Self-pay | Admitting: Psychiatry

## 2020-04-12 ENCOUNTER — Telehealth: Payer: Self-pay | Admitting: Psychiatry

## 2020-04-12 DIAGNOSIS — R7989 Other specified abnormal findings of blood chemistry: Secondary | ICD-10-CM

## 2020-04-12 DIAGNOSIS — F319 Bipolar disorder, unspecified: Secondary | ICD-10-CM

## 2020-04-12 DIAGNOSIS — G3184 Mild cognitive impairment, so stated: Secondary | ICD-10-CM

## 2020-04-12 DIAGNOSIS — F4000 Agoraphobia, unspecified: Secondary | ICD-10-CM | POA: Diagnosis not present

## 2020-04-12 DIAGNOSIS — F422 Mixed obsessional thoughts and acts: Secondary | ICD-10-CM

## 2020-04-12 DIAGNOSIS — F411 Generalized anxiety disorder: Secondary | ICD-10-CM

## 2020-04-12 DIAGNOSIS — F5105 Insomnia due to other mental disorder: Secondary | ICD-10-CM

## 2020-04-12 NOTE — Progress Notes (Signed)
Kristen PapWinnie Leon [id number] 1966/12/03 54 y.o.  Virtual Visit via WebEX  I connected with pt by WebEx and verified that I am speaking with the correct person using two identifiers.   I discussed the limitations, risks, security and privacy concerns of performing an evaluation and management service by [place] [place] and the availability of in person appointments. I also discussed with the patient that there may be a patient responsible charge related to this service. The patient expressed understanding and agreed to proceed.  I discussed the assessment and treatment plan with the patient. The patient was provided an opportunity to ask questions and all were answered. The patient agreed with the plan and demonstrated an understanding of the instructions.   The patient was advised to call back or seek an in-person evaluation if the symptoms worsen or if the condition fails to improve as anticipated.  I provided 30 minutes of video time during this encounter. The call started at 245 and ended at [id number]. The patient was located at home and the provider was located office.  Subjective:   Patient ID:  Kristen PapWinnie Leon is a 54 y.o. (DOB 1966/12/03) female.  Chief Complaint:  Chief Complaint  Patient presents with  . Follow-up  . Anxiety  . Depression  . Medication Problem  . Memory Loss    Medication Refill Pertinent negatives include no chest pain or weakness.     Kristen Leon presents  today for follow-up ofBipolar disorder, generalized anxiety disorder, nocturnal panic attacks, history of OCD, and mild cognitive impairment and worsening driving anxiety.  She has a goal of getting off the Seroquel in hopes of improving memory.  at visit August 06, 2019.  We needed to start a mood stabilizer and discussed variety of options and decided to start with Latuda.  We started at 20 mg and the plan was to increase gradually to 80 mg daily.  If that was successful this follow-up was going to lead to a potential  reduction in an attempt to wean Seroquel. Never got up to 80 mg Latuda.    Did not noticed much of a difference.  visit September 03, 2019.  The following changes were made: Increase vitamin D from 5000 units daily to 10K units daily Start Latuda  80 mg daily Start reduction in Seroquel next week and reduce once every 3 weeks. Reduced Seroquel from 600 to 200 mg HS.  At first had sleep and mood problems and angry.  Added in  gabapentin 300 mg total week ago and sleep better.  Able to sleep on her own.  Mood is better also.  visit October 12, 2019.  The following changes were made: Increase vitamin D from 5000 units daily to 10K units daily Continue Latuda  80 mg daily Continue gradual reduction in Seroquel next week and reduce once every 3 to 4 weeks by 50 mg. Trying to get rid of gabapentin and Seroquel for cognitive reasons primarily  seen December 16, 2019.  The following was noted: Down to Seroquel 150 HS for 3 weeks.  Quality of sleepy slightly off.  How long on this dose?  Tends to wake 30 min earlier than usual.  Bought magnesium.   Feels fine in the day.  Slightly less harried and mentally cloudy with reduction in Seroquel. Likes the [place] as mood medication.   Overall mood and anxiety are under control.  Other concern is focus and memory. Wants to get off gabapentin and Seroquel for that reason.  Feels the meds may  increase risk of dementia.  Overall she has seen some cognitive improvement since the reduction in Seroquel as noted. She was in courage to try reducing the Seroquel further gradually.  She was encouraged to leave the gabapentin at 300 mg daily because it was helpful.  Last seen March 14, 2020.  She was not doing well.  The following was noted: Couldn't sleep without Seroquel 200 mg.  Felt unstable and anxious.  For weeks felt she still struggled at 200 mg Seroquel and raised the dose all the way back up to where she started bc felt desperate and was on edge.   Early 2000's  not on Seroquel and had a lot of anxiety and obsessive thought.  It helps anxiety. Mood better on Seroquel and feels better physically and mentally.  A little down with stress and failure of switch to [place].  little anger but not much depression and no other sx of mania.    Driving anxiety and sense unreality resolved.  Fine with driving now. The following changes were made: Continue vitamin D from 5000 units daily to 10K units daily Wean Latuda to 40 mg for a week and then stop it. Then start Saphris 5mg  HS and reduce Seroquel 400 mg at night for 5 days, then increase Saphris 10 mg at night and reduce Seroquel to 200 mg at night for 5 days, then increase Saphris to 15 HS and stop sEroquel.  04/12/2020 appointment the following is noted: As noted pt has been wanting off Seroquel so med changes last visit wre intended to help. More anxious with reduced gabapentin and Seroquel.  Hard to stop Seroquel bc both anxiety and insomnia and using 100 mg Seroquel.  Missed a day of work over it yesterday.  More sleep with Saphris than Latuda.  No SE with saphris. Anxiety included fidgety. Hasn't tried Saphris 15 mg daily yet.   Has felt in a little less foggy headed with reduced Seroquel and gabapentin.  Down on gabapentin for a while now and only a little change with that reduction.  A little better focus and memory now. Neuropsych testing next week.  When manic feels more reckless, spending, talk to herself, driven, reduced sleep, irritable and angry.   Past Psychiatric Medication Trials: Vraylar 3 SE, Abilify, brief Latuda ? effect, Depakote NR, carbamazepine questionable side effects, lamotrigine lost response, Trileptal, clozapine, olanzapine, lithium no response, mirtazapine, Seroquel 600, risperidone 1 mg twice daily  several SSRIs, clomipramine had vision side effects,  gabapentin, failed an attempt to switch from Seroquel to [place] early 2020  Failed switch from Seroquel to [place] No psych  hosp.  Review of Systems:  Review of Systems  Cardiovascular: Negative for chest pain and palpitations.  Neurological: Negative for dizziness, tremors and weakness.  Psychiatric/Behavioral: Negative for agitation, behavioral problems, confusion, decreased concentration, dysphoric mood, hallucinations, self-injury, sleep disturbance and suicidal ideas. The patient is not nervous/anxious and is not hyperactive.     Medications: I have reviewed the patient's current medications.  Current Outpatient Medications  Medication Sig Dispense Refill  . Acetylcysteine (NAC) 600 MG CAPS Take 1 capsule by mouth daily.    Marland Kitchen asenapine (SAPHRIS) 5 MG SUBL 24 hr tablet 1 SL in AM and 2 tablets SL at night (Patient taking differently: Place 10 mg under the tongue at bedtime. ) 90 tablet 1  . atenolol (TENORMIN) 50 MG tablet Take 50 mg by mouth daily.    . B Complex Vitamins (VITAMIN B-COMPLEX) TABS Take by mouth.    Marland Kitchen  Cholecalciferol (VITAMIN D3) 5000 units CAPS Take 1 capsule by mouth daily.    Marland Kitchen gabapentin (NEURONTIN) 600 MG tablet Take 600 mg by mouth at bedtime.     Marland Kitchen levothyroxine (SYNTHROID, LEVOTHROID) 112 MCG tablet Take 112 mcg by mouth daily before breakfast.    . linaclotide (LINZESS) 290 MCG CAPS capsule TAKE 1 CAPSULE DAILY    . lithium carbonate 150 MG capsule TAKE 1 CAPSULE EVERY       EVENING 90 capsule 0  . LORazepam (ATIVAN) 0.5 MG tablet Take 1-2 tablets (0.5-1 mg total) by mouth every 8 (eight) hours as needed for anxiety. 30 tablet 0  . memantine (NAMENDA) 10 MG tablet TAKE 1 TABLET TWICE A DAY 180 tablet 0  . mirtazapine (REMERON) 30 MG tablet TAKE 1 TABLET AT BEDTIME 90 tablet 1  . QUEtiapine (SEROQUEL) 100 MG tablet Take 1.5 tablets (150 mg total) by mouth at bedtime. (Patient taking differently: Take 100 mg by mouth at bedtime. ) 135 tablet 1  . temazepam (RESTORIL) 15 MG capsule at bedtime as needed.    . zaleplon (SONATA) 10 MG capsule Take 1 capsule (10 mg total) by mouth at  bedtime as needed for sleep. 30 capsule 0   No current facility-administered medications for this visit.    Medication Side Effects: None  Allergies: No Known Allergies  History reviewed. No pertinent past medical history.  History reviewed. No pertinent family history.  Social History   Socioeconomic History  . Marital status: Divorced    Spouse name: Not on file  . Number of children: Not on file  . Years of education: Not on file  . Highest education level: Not on file  Occupational History  . Not on file  Tobacco Use  . Smoking status: Former Smoker    Quit date: 2000    Years since quitting: 21.3  . Smokeless tobacco: Never Used  Substance and Sexual Activity  . Alcohol use: Not on file  . Drug use: Not on file  . Sexual activity: Not on file  Other Topics Concern  . Not on file  Social History Narrative  . Not on file   Social Determinants of Health   Financial Resource Strain:   . Difficulty of Paying Living Expenses:   Food Insecurity:   . Worried About Programme researcher, broadcasting/film/video in the Last Year:   . Barista in the Last Year:   Transportation Needs:   . Freight forwarder (Medical):   Marland Kitchen Lack of Transportation (Non-Medical):   Physical Activity:   . Days of Exercise per Week:   . Minutes of Exercise per Session:   Stress:   . Feeling of Stress :   Social Connections:   . Frequency of Communication with Friends and Family:   . Frequency of Social Gatherings with Friends and Family:   . Attends Religious Services:   . Active Member of Clubs or Organizations:   . Attends Banker Meetings:   Marland Kitchen Marital Status:   Intimate Partner Violence:   . Fear of Current or Ex-Partner:   . Emotionally Abused:   Marland Kitchen Physically Abused:   . Sexually Abused:     Past Medical History, Surgical history, Social history, and Family history were reviewed and updated as appropriate.   Please see review of systems for further details on the patient's  review from today.   Objective:   Physical Exam:  There were no vitals taken for this visit.  Physical Exam Neurological:     Mental Status: She is alert and oriented to person, place, and time.     Cranial Nerves: No dysarthria.  Psychiatric:        Attention and Perception: Attention normal.        Mood and Affect: Mood is anxious. Mood is not depressed.        Speech: Speech normal. Speech is not rapid and pressured.        Behavior: Behavior is cooperative.        Thought Content: Thought content normal. Thought content is not paranoid or delusional. Thought content does not include homicidal or suicidal ideation. Thought content does not include homicidal or suicidal plan.        Cognition and Memory: She exhibits impaired recent memory. She does not exhibit impaired remote memory.        Judgment: Judgment normal.     Comments: Insight and judgment appear good. Denies mania symptoms or severe depression irritable.  She is also noticing less mood cycling.  Anxiety is worse     Lab Review:  No results found for: NA, K, CL, CO2, GLUCOSE, BUN, CREATININE, CALCIUM, PROT, ALBUMIN, AST, ALT, ALKPHOS, BILITOT, GFRNONAA, GFRAA  No results found for: WBC, RBC, HGB, HCT, PLT, MCV, MCH, MCHC, RDW, LYMPHSABS, MONOABS, EOSABS, BASOSABS  No results found for: POCLITH, LITHIUM   No results found for: PHENYTOIN, PHENOBARB, VALPROATE, CBMZ   Took lab orders to PCP from visit in September and reports results: B12 >2000 Folate > 20 D 43.5 Iron TIBC 297 (250-450)  UIBC 156 (131-425)  Iron 141 (27-159)  Iron saturation 47 (15-55)   .res Assessment: Plan:    Bipolar I disorder (HCC)  Generalized anxiety disorder  Mixed obsessional thoughts and acts  Agoraphobia  MCI (mild cognitive impairment)  Insomnia due to mental condition  Low vitamin D level   Greater than 50% of 30-minute video time with patient was spent on counseling and coordination of care. We discussed nature  of her psychiatric diagnoses .   She remains motivated to get off Seroquel because she believes is causing cognitive problems. Bipolar disorder Was worse with reduction in Seroquel At visit in April 2021 we started Saphris and attempted to wean Seroquel.  When she completely stop Seroquel her anxiety and insomnia were not manageable.  At 100 mg sleep is manageable but anxiety is still a problem.  She has not tried the 15 mg Saphris as planned yet.  Option retry Abilify, Caplyta, loxapine, Saphris if necessary.  Disc differences.  Failed multiple meds.  OK prn lorazepam.  Caution re driving and drowsiness.  We discussed the short-term risks associated with benzodiazepines including sedation and increased fall risk among others.  Discussed long-term side effect risk including dependence, potential withdrawal symptoms, and the potential eventual dose-related risk of dementia.  No reduction in Neurontin bc it has helped.  She is doing okay with gabapentin 600 mg nightly.  She thinks perhaps the reduction in gabapentin has helped cognitions to some degree.  She is focusing on trying to reduce and eliminate the Seroquel at this time.  Discussed potential metabolic side effects associated with atypical antipsychotics, as well as potential risk for movement side effects. Advised pt to contact office if movement side effects occur.   Disc cognitive risks at length with meds and risks of untreated bipolar disorder for cognition as well.  She is taking Namenda 10 mg twice daily off label for help with this symptom.  Thyroid was normal a few months ago. B12 and folate are normal D is OK but goal with her should be 50s-60s Disc reasons in detail including Dr. Charm Barges recommendation. .  We discussed there is a risk of increased mood swings or depression with this med change as we do not do not know ahead of time if she is going to be responsive to Saphris.  .  At the lower doses of Seroquel sometimes patients  will have difficulty dropping further because of insomnia that occurs which is the reason we selected Saphris because it is typically effective for sleep..  Discussed the importance of protecting her sleep for overall mood stability.  The risks of very low dosages such as 50-100 mg of Seroquel are likely very minimal and specifically minimal risk of EPS, weight gain or daytime cognitive problems.  Increase Saphris 15 mg HS and stop Seroquel again.  Can reduce Seroquel to 50 or even lower if needed.    Greater than 50% of 30-minutevideo time with patient was spent on counseling and coordination of care.   FU 1 months  Meredith Staggers, MD, DFAPA   Please see After Visit Summary for patient specific instructions.  No future appointments.  No orders of the defined types were placed in this encounter.     -------------------------------

## 2020-04-12 NOTE — Telephone Encounter (Signed)
Ms. Kristen Leon, Kristen Leon are scheduled for a virtual visit with your provider today.    Just as we do with appointments in the office, we must obtain your consent to participate.  Your consent will be active for this visit and any virtual visit you may have with one of our providers in the next 365 days.    If you have a MyChart account, I can also send a copy of this consent to you electronically.  All virtual visits are billed to your insurance company just like a traditional visit in the office.  As this is a virtual visit, video technology does not allow for your provider to perform a traditional examination.  This may limit your provider's ability to fully assess your condition.  If your provider identifies any concerns that need to be evaluated in person or the need to arrange testing such as labs, EKG, etc, we will make arrangements to do so.    Although advances in technology are sophisticated, we cannot ensure that it will always work on either your end or our end.  If the connection with a video visit is poor, we may have to switch to a telephone visit.  With either a video or telephone visit, we are not always able to ensure that we have a secure connection.   I need to obtain your verbal consent now.   Are you willing to proceed with your visit today?   Kristen Leon has provided verbal consent on 04/12/2020 for a virtual visit (video or telephone).   Lauraine Rinne, MD 04/12/2020  5:30 PM

## 2020-04-14 ENCOUNTER — Other Ambulatory Visit: Payer: Self-pay | Admitting: Psychiatry

## 2020-04-14 DIAGNOSIS — F319 Bipolar disorder, unspecified: Secondary | ICD-10-CM

## 2020-04-14 NOTE — Telephone Encounter (Signed)
Reduction in seroquel per last visit. Change Rx?

## 2020-04-18 ENCOUNTER — Other Ambulatory Visit: Payer: Self-pay

## 2020-04-20 ENCOUNTER — Other Ambulatory Visit: Payer: Self-pay | Admitting: Psychiatry

## 2020-04-20 ENCOUNTER — Telehealth: Payer: Self-pay | Admitting: Psychiatry

## 2020-04-20 MED ORDER — LITHIUM CARBONATE 150 MG PO CAPS
150.0000 mg | ORAL_CAPSULE | Freq: Every day | ORAL | 3 refills | Status: DC
Start: 2020-04-20 — End: 2021-02-17

## 2020-04-20 NOTE — Telephone Encounter (Signed)
I've tried to reach the # left for [facility] several times but I only get automated responses and it disconnects every time and I'm not able to speak with a live representative. Will attempt to call back at a later time.

## 2020-04-20 NOTE — Telephone Encounter (Signed)
Tried to reach [facility] again but still unable to reach a live representative.

## 2020-04-20 NOTE — Telephone Encounter (Signed)
She is on low dose lithium 150 for it's neuroprotective effect at reducing dementia risk.  Can you call in the Rx or do I need to send it in again? thanks

## 2020-04-20 NOTE — Telephone Encounter (Signed)
I'll send in another RX and see if that works

## 2020-04-20 NOTE — Telephone Encounter (Signed)
Noted thank you

## 2020-04-20 NOTE — Telephone Encounter (Signed)
Pharmacy tech for [facility] LM stating that the Lithium Carbonate 150mg  RX that was sent in is getting kicked back b/c it is a new RX? They need RX called in for new script. Please call with REF # [id number].

## 2020-05-09 ENCOUNTER — Other Ambulatory Visit: Payer: Self-pay | Admitting: Psychiatry

## 2020-05-09 ENCOUNTER — Telehealth: Payer: Self-pay | Admitting: Psychiatry

## 2020-05-09 DIAGNOSIS — F319 Bipolar disorder, unspecified: Secondary | ICD-10-CM

## 2020-05-09 MED ORDER — QUETIAPINE FUMARATE 100 MG PO TABS: 100.0000 mg | ORAL_TABLET | Freq: Every day | ORAL | 0 refills | Status: DC

## 2020-05-09 MED ORDER — QUETIAPINE FUMARATE 200 MG PO TABS: 400.0000 mg | ORAL_TABLET | Freq: Every day | ORAL | 0 refills | Status: DC

## 2020-05-09 NOTE — Telephone Encounter (Signed)
I am not sure why she is asking for 2 different dosages of quetiapine.  I understand she is reducing the dose and we have discussed that.  She can cut the 200 mg tablets in half to get 1-1/2 or 300 mg daily as she is wanting to do and is fine to send a prescription in for that.  She does not need a prescription for a 200 mg tablet and a 100 mg tablet.  If she prefers we can send in a prescription for 100 mg tablets 3 daily.  It is okay to send in prescription for gabapentin 600 mg twice daily.

## 2020-05-09 NOTE — Telephone Encounter (Signed)
Prescription sent for quetiapine 200 mg tablets 2 nightly and 100 mg tablets 1 nightly for 90 days of each respectively.

## 2020-05-09 NOTE — Telephone Encounter (Signed)
Pt called to schedule. Can't get in until Aug 3.  Wants to get Seroquel dose adjusted. She has restarted Seroquel and its taking 200mg  3 a day right now. She is reducing it as much as she can. She wants to take 200mg  tab, and 100mg  so she can reduce it. Neurontin is restarted- 600mg  2 a day also. Please call these in : [facility]  90 day RXs.

## 2020-05-10 ENCOUNTER — Other Ambulatory Visit: Payer: Self-pay

## 2020-05-10 MED ORDER — GABAPENTIN 600 MG PO TABS: 600.0000 mg | ORAL_TABLET | Freq: Two times a day (BID) | ORAL | 1 refills | Status: DC

## 2020-05-10 NOTE — Telephone Encounter (Signed)
noted 

## 2020-05-18 ENCOUNTER — Other Ambulatory Visit: Payer: Self-pay

## 2020-05-18 DIAGNOSIS — F319 Bipolar disorder, unspecified: Secondary | ICD-10-CM

## 2020-05-18 MED ORDER — ASENAPINE MALEATE 5 MG SL SUBL: SUBLINGUAL_TABLET | SUBLINGUAL | 1 refills | Status: DC

## 2020-07-07 ENCOUNTER — Other Ambulatory Visit: Payer: Self-pay | Admitting: Psychiatry

## 2020-07-07 ENCOUNTER — Encounter: Payer: Self-pay | Admitting: Psychiatry

## 2020-07-07 ENCOUNTER — Telehealth (INDEPENDENT_AMBULATORY_CARE_PROVIDER_SITE_OTHER): Payer: Federal, State, Local not specified - PPO | Admitting: Psychiatry

## 2020-07-07 DIAGNOSIS — F319 Bipolar disorder, unspecified: Secondary | ICD-10-CM | POA: Diagnosis not present

## 2020-07-07 DIAGNOSIS — F411 Generalized anxiety disorder: Secondary | ICD-10-CM

## 2020-07-07 DIAGNOSIS — F4000 Agoraphobia, unspecified: Secondary | ICD-10-CM

## 2020-07-07 DIAGNOSIS — G3184 Mild cognitive impairment, so stated: Secondary | ICD-10-CM

## 2020-07-07 DIAGNOSIS — F422 Mixed obsessional thoughts and acts: Secondary | ICD-10-CM

## 2020-07-07 DIAGNOSIS — F5105 Insomnia due to other mental disorder: Secondary | ICD-10-CM

## 2020-07-07 DIAGNOSIS — R7989 Other specified abnormal findings of blood chemistry: Secondary | ICD-10-CM

## 2020-07-07 MED ORDER — ZALEPLON 10 MG PO CAPS: 10.0000 mg | ORAL_CAPSULE | Freq: Every evening | ORAL | 0 refills | Status: DC | PRN

## 2020-07-07 MED ORDER — ASENAPINE MALEATE 5 MG SL SUBL: SUBLINGUAL_TABLET | SUBLINGUAL | 1 refills | Status: DC

## 2020-07-07 NOTE — Progress Notes (Signed)
Kristen PapWinnie Profeta [id number] 1966/11/06 54 y.o.  Virtual Visit via WebEX  I connected with pt by WebEx and verified that I am speaking with the correct person using two identifiers.   I discussed the limitations, risks, security and privacy concerns of performing an evaluation and management service by [facility] and the availability of in person appointments. I also discussed with the patient that there may be a patient responsible charge related to this service. The patient expressed understanding and agreed to proceed.  I discussed the assessment and treatment plan with the patient. The patient was provided an opportunity to ask questions and all were answered. The patient agreed with the plan and demonstrated an understanding of the instructions.   The patient was advised to call back or seek an in-person evaluation if the symptoms worsen or if the condition fails to improve as anticipated.  I provided 30 minutes of video time during this encounter. The call started at 930 and ended at 1000. The patient was located at home and the provider was located office.  Subjective:   Patient ID:  Kristen Leon is a 54 y.o. (DOB 1966/11/06) female.  Chief Complaint:  Chief Complaint  Patient presents with  . Follow-up  . Medication Problem  . Manic Behavior    Medication Refill Pertinent negatives include no chest pain or weakness.     Kristen PapWinnie Ly presents  today for follow-up ofBipolar disorder, generalized anxiety disorder, nocturnal panic attacks, history of OCD, and mild cognitive impairment and worsening driving anxiety.  She has a goal of getting off the Seroquel in hopes of improving memory.  at visit August 06, 2019.  We needed to start a mood stabilizer and discussed variety of options and decided to start with Latuda.  We started at 20 mg and the plan was to increase gradually to 80 mg daily.  If that was successful this follow-up was going to lead to a potential reduction in an attempt  to wean Seroquel. Never got up to 80 mg Latuda.    Did not noticed much of a difference.  visit September 03, 2019.  The following changes were made: Increase vitamin D from 5000 units daily to 10K units daily Start Latuda  80 mg daily Start reduction in Seroquel next week and reduce once every 3 weeks. Reduced Seroquel from 600 to 200 mg HS.  At first had sleep and mood problems and angry.  Added in  gabapentin 300 mg total week ago and sleep better.  Able to sleep on her own.  Mood is better also.  visit October 12, 2019.  The following changes were made: Increase vitamin D from 5000 units daily to 10K units daily Continue Latuda  80 mg daily Continue gradual reduction in Seroquel next week and reduce once every 3 to 4 weeks by 50 mg. Trying to get rid of gabapentin and Seroquel for cognitive reasons primarily  seen December 16, 2019.  The following was noted: Down to Seroquel 150 HS for 3 weeks.  Quality of sleepy slightly off.  How long on this dose?  Tends to wake 30 min earlier than usual.  Bought magnesium.   Feels fine in the day.  Slightly less harried and mentally cloudy with reduction in Seroquel. Likes the [place] as mood medication.   Overall mood and anxiety are under control.  Other concern is focus and memory. Wants to get off gabapentin and Seroquel for that reason.  Feels the meds may increase risk of dementia.  Overall  she has seen some cognitive improvement since the reduction in Seroquel as noted. She was in courage to try reducing the Seroquel further gradually.  She was encouraged to leave the gabapentin at 300 mg daily because it was helpful.  Last seen March 14, 2020.  She was not doing well.  The following was noted: Couldn't sleep without Seroquel 200 mg.  Felt unstable and anxious.  For weeks felt she still struggled at 200 mg Seroquel and raised the dose all the way back up to where she started bc felt desperate and was on edge.   Early 2000's not on Seroquel and had  a lot of anxiety and obsessive thought.  It helps anxiety. Mood better on Seroquel and feels better physically and mentally.  A little down with stress and failure of switch to [place].  little anger but not much depression and no other sx of mania.    Driving anxiety and sense unreality resolved.  Fine with driving now. The following changes were made: Continue vitamin D from 5000 units daily to 10K units daily Wean Latuda to 40 mg for a week and then stop it. Then start Saphris 5mg  HS and reduce Seroquel 400 mg at night for 5 days, then increase Saphris 10 mg at night and reduce Seroquel to 200 mg at night for 5 days, then increase Saphris to 15 HS and stop sEroquel.  04/12/2020 appointment the following is noted: As noted pt has been wanting off Seroquel so med changes last visit wre intended to help. More anxious with reduced gabapentin and Seroquel.  Hard to stop Seroquel bc both anxiety and insomnia and using 100 mg Seroquel.  Missed a day of work over it yesterday.  More sleep with Saphris than Latuda.  No SE with saphris. Anxiety included fidgety. Hasn't tried Saphris 15 mg daily yet.   Has felt in a little less foggy headed with reduced Seroquel and gabapentin.  Down on gabapentin for a while now and only a little change with that reduction.  A little better focus and memory now. Neuropsych testing next week. Plan: Increase Saphris 15 mg HS and stop Seroquel again.  Can reduce Seroquel to 50 or even lower if needed.    05/09/2020 phone call with the following noted: Patient had to go back up to 600 mg Seroquel at hs, she has tried the last few nights at 500 mg. She's been unsuccessful tapering down and just wanted to have 200 mg and 100 mg tablets available to try and get it lowered.   07/07/2020 appointment with the following noted: Got close to off Seroquel but felt untethered and trouble with sleep initial and terminal. 5-6 hours on low dose Seroquel. Never tried Saphris 15 mg for reasons  that are unclear.  Stopped shortly after the phone session from last time. Started Seroquel by 2002 and gabapentin in 2003.  Finds it very difficult to get off the meds and worry over instability.  When manic feels more reckless, spending, talk to herself, driven, reduced sleep, irritable and angry.  Past Psychiatric Medication Trials: Vraylar 3 SE, Abilify, brief Latuda ? effect, Depakote NR, carbamazepine questionable side effects, lamotrigine lost response, Trileptal, clozapine, olanzapine, lithium no response, mirtazapine, Seroquel 600, risperidone 1 mg twice daily  several SSRIs, clomipramine had vision side effects,  gabapentin, failed an attempt to switch from Seroquel to Vraylar early 2020  Failed switch from Seroquel to Latuda No psych hosp.  Review of Systems:  Review of Systems  Cardiovascular:  Negative for chest pain and palpitations.  Neurological: Negative for dizziness, tremors, weakness and light-headedness.  Psychiatric/Behavioral: Negative for agitation, behavioral problems, confusion, decreased concentration, dysphoric mood, hallucinations, self-injury, sleep disturbance and suicidal ideas. The patient is not nervous/anxious and is not hyperactive.     Medications: I have reviewed the patient's current medications.  Current Outpatient Medications  Medication Sig Dispense Refill  . Acetylcysteine (NAC) 600 MG CAPS Take 1 capsule by mouth daily.    Marland Kitchen atenolol (TENORMIN) 50 MG tablet Take 50 mg by mouth daily.    . B Complex Vitamins (VITAMIN B-COMPLEX) TABS Take by mouth.    . Cholecalciferol (VITAMIN D3) 5000 units CAPS Take 1 capsule by mouth daily.    Marland Kitchen gabapentin (NEURONTIN) 600 MG tablet Take 1 tablet (600 mg total) by mouth 2 (two) times daily. (Patient taking differently: Take 900 mg by mouth daily. ) 180 tablet 1  . levothyroxine (SYNTHROID, LEVOTHROID) 112 MCG tablet Take 112 mcg by mouth daily before breakfast.    . linaclotide (LINZESS) 290 MCG CAPS capsule  TAKE 1 CAPSULE DAILY    . lithium carbonate 150 MG capsule Take 1 capsule (150 mg total) by mouth daily in the afternoon. 90 capsule 3  . LORazepam (ATIVAN) 0.5 MG tablet Take 1-2 tablets (0.5-1 mg total) by mouth every 8 (eight) hours as needed for anxiety. 30 tablet 0  . memantine (NAMENDA) 10 MG tablet TAKE 1 TABLET TWICE A DAY 180 tablet 0  . mirtazapine (REMERON) 30 MG tablet TAKE 1 TABLET AT BEDTIME 90 tablet 0  . QUEtiapine (SEROQUEL) 100 MG tablet Take 1 tablet (100 mg total) by mouth at bedtime. 90 tablet 0  . QUEtiapine (SEROQUEL) 200 MG tablet Take 2 tablets (400 mg total) by mouth at bedtime. 180 tablet 0  . temazepam (RESTORIL) 15 MG capsule at bedtime as needed.    . zaleplon (SONATA) 10 MG capsule Take 1 capsule (10 mg total) by mouth at bedtime as needed for sleep. 30 capsule 0  . asenapine (SAPHRIS) 5 MG SUBL 24 hr tablet 1 SL in AM and 2 tablets SL at night (Patient not taking: Reported on 07/07/2020) 90 tablet 1   No current facility-administered medications for this visit.    Medication Side Effects: None  Allergies: No Known Allergies  History reviewed. No pertinent past medical history.  History reviewed. No pertinent family history.  Social History   Socioeconomic History  . Marital status: Divorced    Spouse name: Not on file  . Number of children: Not on file  . Years of education: Not on file  . Highest education level: Not on file  Occupational History  . Not on file  Tobacco Use  . Smoking status: Former Smoker    Quit date: 2000    Years since quitting: 21.6  . Smokeless tobacco: Never Used  Substance and Sexual Activity  . Alcohol use: Not on file  . Drug use: Not on file  . Sexual activity: Not on file  Other Topics Concern  . Not on file  Social History Narrative  . Not on file   Social Determinants of Health   Financial Resource Strain:   . Difficulty of Paying Living Expenses:   Food Insecurity:   . Worried About Programme researcher, broadcasting/film/video  in the Last Year:   . Barista in the Last Year:   Transportation Needs:   . Freight forwarder (Medical):   Marland Kitchen Lack of Transportation (Non-Medical):  Physical Activity:   . Days of Exercise per Week:   . Minutes of Exercise per Session:   Stress:   . Feeling of Stress :   Social Connections:   . Frequency of Communication with Friends and Family:   . Frequency of Social Gatherings with Friends and Family:   . Attends Religious Services:   . Active Member of Clubs or Organizations:   . Attends Banker Meetings:   Marland Kitchen Marital Status:   Intimate Partner Violence:   . Fear of Current or Ex-Partner:   . Emotionally Abused:   Marland Kitchen Physically Abused:   . Sexually Abused:     Past Medical History, Surgical history, Social history, and Family history were reviewed and updated as appropriate.   Please see review of systems for further details on the patient's review from today.   Objective:   Physical Exam:  There were no vitals taken for this visit.  Physical Exam Neurological:     Mental Status: She is alert and oriented to person, place, and time.     Cranial Nerves: No dysarthria.  Psychiatric:        Attention and Perception: Attention normal.        Mood and Affect: Mood is anxious. Mood is not depressed.        Speech: Speech normal. Speech is not rapid and pressured.        Behavior: Behavior is cooperative.        Thought Content: Thought content normal. Thought content is not paranoid or delusional. Thought content does not include homicidal or suicidal ideation. Thought content does not include homicidal or suicidal plan.        Cognition and Memory: She exhibits impaired recent memory. She does not exhibit impaired remote memory.        Judgment: Judgment normal.     Comments: Insight and judgment appear good. Denies mania symptoms or severe depression irritable.  She is also noticing less mood cycling.  Anxiety is worse     Lab Review:  No  results found for: NA, K, CL, CO2, GLUCOSE, BUN, CREATININE, CALCIUM, PROT, ALBUMIN, AST, ALT, ALKPHOS, BILITOT, GFRNONAA, GFRAA  No results found for: WBC, RBC, HGB, HCT, PLT, MCV, MCH, MCHC, RDW, LYMPHSABS, MONOABS, EOSABS, BASOSABS  No results found for: POCLITH, LITHIUM   No results found for: PHENYTOIN, PHENOBARB, VALPROATE, CBMZ   Took lab orders to PCP from visit in September and reports results: B12 >2000 Folate > 20 D 43.5 Iron TIBC 297 (250-450)  UIBC 156 (131-425)  Iron 141 (27-159)  Iron saturation 47 (15-55)   .res Assessment: Plan:    Bipolar I disorder (HCC)  Generalized anxiety disorder  Mixed obsessional thoughts and acts  Agoraphobia  MCI (mild cognitive impairment)  Insomnia due to mental condition  Low vitamin D level   Greater than 50% of 30-minute video time with patient was spent on counseling and coordination of care. We discussed nature of her psychiatric diagnoses .   She remains motivated to get off Seroquel because she believes is causing cognitive problems. Bipolar disorder Was worse with reduction in Seroquel At 2 recent visits in April 2021 we started Saphris and attempted to wean Seroquel.  When she completely stop Seroquel her anxiety and insomnia were not manageable.  At 100 mg sleep is manageable but anxiety is still a problem.  She has not tried the 15 mg Saphris as planned yet.  Reasons for not doing so are vague but she  commits again to trying it.  Discussed her questions about progression of underlying bipolar disorder and possible neuro degenerative changes.  We cannot separate her concerns about cognitive status and because until we switched her off at least the quetiapine and hopefully as well to gabapentin then we can be better able to assess cause and effect between cognitive problems related to underlying bipolar disorder versus medication side effect.  Option retry Abilify, Caplyta, loxapine, Saphris if necessary.  Disc  differences.  Failed multiple meds.  OK prn lorazepam.  Caution re driving and drowsiness.  We discussed the short-term risks associated with benzodiazepines including sedation and increased fall risk among others.  Discussed long-term side effect risk including dependence, potential withdrawal symptoms, and the potential eventual dose-related risk of dementia.  reduction in Neurontin OK but fears worsening anxiety.  She is doing okay with gabapentin 600 mg nightly.  She thinks perhaps the reduction in gabapentin has helped cognitions to some degree.  She is focusing on trying to reduce and eliminate the Seroquel at this time. Recommend no change in this until get switch to Saphris.  Discussed potential metabolic side effects associated with atypical antipsychotics, as well as potential risk for movement side effects. Advised pt to contact office if movement side effects occur.   Disc cognitive risks at length with meds and risks of untreated bipolar disorder for cognition as well.  She is taking Namenda 10 mg twice daily off label for help with this symptom. She's FU with neuropsych in October  Thyroid was normal recently. B12 and folate are normal D is OK but goal with her should be 50s-60s Disc reasons in detail including Dr. Charm Barges recommendation. .  We discussed there is a risk of increased mood swings or depression with this med change as we do not do not know ahead of time if she is going to be responsive to Saphris.  .  At the lower doses of Seroquel sometimes patients will have difficulty dropping further because of insomnia that occurs which is the reason we selected Saphris because it is typically effective for sleep..  Discussed the importance of protecting her sleep for overall mood stability.  The risks of very low dosages such as 50-100 mg of Seroquel are likely very minimal and specifically minimal risk of EPS, weight gain or daytime cognitive problems.  Start Saphris 10 mg at night  and reduce Seroquel to 400 mg nightly for 4 nights, Then increase Saphris to 15 mg at night and reduce Seroquel to 200 mg for 4 nights, Then reduce Seroquel to 100 mg and continue Saphris at 15 mg nightly for 4 nights, Then stop Seroquel and if insomnia occurs, increase Saphris to 20 mg at night.  Greater than 50% of 30-minutevideo time with patient was spent on counseling and coordination of care.   FU 1 months  Meredith Staggers, MD, DFAPA   Please see After Visit Summary for patient specific instructions.  No future appointments.  No orders of the defined types were placed in this encounter.     -------------------------------

## 2020-07-07 NOTE — Patient Instructions (Signed)
Start Saphris 10 mg at night and reduce Seroquel to 400 mg nightly for 4 nights, Then increase Saphris to 15 mg at night and reduce Seroquel to 200 mg for 4 nights, Then reduce Seroquel to 100 mg and continue Saphris at 15 mg nightly for 4 nights, Then stop Seroquel and if insomnia occurs, increase Saphris to 20 mg at night.

## 2020-07-19 ENCOUNTER — Telehealth: Payer: Self-pay | Admitting: Psychiatry

## 2020-07-19 NOTE — Telephone Encounter (Signed)
There are only a few options left that have a chance of getting her off Seroquel per her request.  They can all cause RLS but we can try loxapine in place of Saphris but it is hard to dose it.  It has a wide dosage range and every body is different in sensitivity.  We may have to treat the RLS with meds if changing doesn't work.  Stop Saphris and start loxapine 25 mg nightly for 3 nights then 50 mg nightly for 3 nights then 75 mg nightly.

## 2020-07-19 NOTE — Telephone Encounter (Signed)
Pt called and said that Zafrin is giving her restless leg syndrome. She said she took Lorazepam to calm the RLS down. Is there another medication she can be put on that hopefully will not cause this? [id number]

## 2020-07-19 NOTE — Telephone Encounter (Signed)
I'm sure the patient means Saphris is causing restless leg syndrome.

## 2020-07-20 NOTE — Telephone Encounter (Signed)
LM to call back to discuss.

## 2020-07-22 ENCOUNTER — Telehealth: Payer: Self-pay

## 2020-07-22 ENCOUNTER — Other Ambulatory Visit: Payer: Self-pay | Admitting: Psychiatry

## 2020-07-22 MED ORDER — LOXAPINE SUCCINATE 25 MG PO CAPS: ORAL_CAPSULE | ORAL | 0 refills | Status: DC

## 2020-07-22 NOTE — Telephone Encounter (Signed)
Kristen Leon returned the call to the nurse.  I gave her the information to stop the saphris and begin loxapine and gave her the instructions on how to begin the medication.  The Loxapine will need to be called into the pharmacy.  [facility] on [address] in [place].  She was still asking how to taper off the Seroquel.  I was unclear how to answer that question except to tell her to begin the loxapine first.  Please call her to clarify what she should do about the Seroquel.

## 2020-07-22 NOTE — Telephone Encounter (Signed)
Prior authorization submitted and approved for SONATA 10 MG effective 06/15/2020-07/15/2021 with FEP/BCBS

## 2020-07-22 NOTE — Telephone Encounter (Signed)
Patient called back and Jola Babinski gave her information. Send in Rx for Loxapine. Also does she taper off seroquel now?

## 2020-07-22 NOTE — Telephone Encounter (Signed)
Give her this change in instructions for transition from Seroquel to loxapine: Start loxapine 25 mg capsule 1 at night and reduce Seroquel to 400 mg nightly for 4 nights, Then increase loxapine to 2 capsules at night and reduce Seroquel to 200 mg for 4 nights, Then reduce Seroquel to 100 mg and increase loxapine to 3 capsules nightly for 4 nights, Then stop Seroquel and if insomnia occurs, increase loxapine to 4 capsules at night and notify MD.

## 2020-07-25 ENCOUNTER — Telehealth: Payer: Self-pay | Admitting: Psychiatry

## 2020-07-25 ENCOUNTER — Other Ambulatory Visit: Payer: Self-pay | Admitting: Psychiatry

## 2020-07-25 MED ORDER — ROPINIROLE HCL 0.5 MG PO TABS: ORAL_TABLET | ORAL | 1 refills | Status: DC

## 2020-07-25 NOTE — Telephone Encounter (Signed)
LM to call back.

## 2020-07-25 NOTE — Telephone Encounter (Signed)
Federica called as follow up from Traci's call.  Judee wants to stay with the Saphris.  She really likes it.  So she is just requesting a prescription for restless legs.  She got the message about the loxapine but again wants to stay with the Saphris.

## 2020-07-25 NOTE — Telephone Encounter (Signed)
Patient called back and wants to continue Saphris instead, asking for medication for restless legs instead.

## 2020-07-25 NOTE — Telephone Encounter (Signed)
Let her know I sent in a RX for ropinirole for restless legs.  Be sure to read the instructions on the bottle and follow them.  If it works at the lower doses then don't increase the dose any higher than recommended.  Call if it doesn't work.  Most common SE is occasional nausea.

## 2020-07-26 NOTE — Telephone Encounter (Signed)
Left detailed information and to call back with further questions 

## 2020-07-26 NOTE — Telephone Encounter (Signed)
Pharmacy says insurance requesting 90 day

## 2020-07-30 ENCOUNTER — Other Ambulatory Visit: Payer: Self-pay | Admitting: Psychiatry

## 2020-07-30 DIAGNOSIS — F319 Bipolar disorder, unspecified: Secondary | ICD-10-CM

## 2020-09-13 ENCOUNTER — Encounter: Payer: Self-pay | Admitting: Psychiatry

## 2020-09-13 ENCOUNTER — Telehealth (INDEPENDENT_AMBULATORY_CARE_PROVIDER_SITE_OTHER): Payer: Federal, State, Local not specified - PPO | Admitting: Psychiatry

## 2020-09-13 DIAGNOSIS — F319 Bipolar disorder, unspecified: Secondary | ICD-10-CM

## 2020-09-13 DIAGNOSIS — F411 Generalized anxiety disorder: Secondary | ICD-10-CM | POA: Diagnosis not present

## 2020-09-13 DIAGNOSIS — F4000 Agoraphobia, unspecified: Secondary | ICD-10-CM

## 2020-09-13 DIAGNOSIS — F422 Mixed obsessional thoughts and acts: Secondary | ICD-10-CM

## 2020-09-13 DIAGNOSIS — F5105 Insomnia due to other mental disorder: Secondary | ICD-10-CM

## 2020-09-13 DIAGNOSIS — R7989 Other specified abnormal findings of blood chemistry: Secondary | ICD-10-CM

## 2020-09-13 DIAGNOSIS — G3184 Mild cognitive impairment, so stated: Secondary | ICD-10-CM

## 2020-09-13 MED ORDER — NAC 600 MG PO CAPS
1.0000 | ORAL_CAPSULE | Freq: Every day | ORAL | 1 refills | Status: AC
Start: 2020-09-13 — End: ?

## 2020-09-13 NOTE — Progress Notes (Signed)
Kristen Leon [id number] [birth year] 54 y.o.  Virtual Visit via WebEX  I connected with pt by WebEx and verified that I am speaking with the correct person using two identifiers.   I discussed the limitations, risks, security and privacy concerns of performing an evaluation and management service by [name] and the availability of in person appointments. I also discussed with the patient that there may be a patient responsible charge related to this service. The patient expressed understanding and agreed to proceed.  I discussed the assessment and treatment plan with the patient. The patient was provided an opportunity to ask questions and all were answered. The patient agreed with the plan and demonstrated an understanding of the instructions.   The patient was advised to call back or seek an in-person evaluation if the symptoms worsen or if the condition fails to improve as anticipated.  I provided 30 minutes of video time during this encounter. The call started at 1140 and ended at 1215. The patient was located at home and the provider was located office.  Subjective:   Patient ID:  Kristen Leon is a 54 y.o. (DOB 1966-10-15) female.  Chief Complaint:  Chief Complaint  Patient presents with  . Follow-up  . Manic Behavior  . Depression  . Anxiety  . Sleeping Problem  . Medication Problem    Medication Refill Pertinent negatives include no chest pain or weakness.     Kristen Leon presents  today for follow-up ofBipolar disorder, generalized anxiety disorder, nocturnal panic attacks, history of OCD, and mild cognitive impairment and worsening driving anxiety.  She has a goal of getting off the Seroquel in hopes of improving memory.  at visit August 06, 2019.  We needed to start a mood stabilizer and discussed variety of options and decided to start with Latuda.  We started at 20 mg and the plan was to increase gradually to 80 mg daily.  If that was successful this follow-up was  going to lead to a potential reduction in an attempt to wean Seroquel. Never got up to 80 mg Latuda.    Did not noticed much of a difference.  visit September 03, 2019.  The following changes were made: Increase vitamin D from 5000 units daily to 10K units daily Start Latuda  80 mg daily Start reduction in Seroquel next week and reduce once every 3 weeks. Reduced Seroquel from 600 to 200 mg HS.  At first had sleep and mood problems and angry.  Added in  gabapentin 300 mg total week ago and sleep better.  Able to sleep on her own.  Mood is better also.  visit October 12, 2019.  The following changes were made: Increase vitamin D from 5000 units daily to 10K units daily Continue Latuda  80 mg daily Continue gradual reduction in Seroquel next week and reduce once every 3 to 4 weeks by 50 mg. Trying to get rid of gabapentin and Seroquel for cognitive reasons primarily  seen December 16, 2019.  The following was noted: Down to Seroquel 150 HS for 3 weeks.  Quality of sleepy slightly off.  How long on this dose?  Tends to wake 30 min earlier than usual.  Bought magnesium.   Feels fine in the day.  Slightly less harried and mentally cloudy with reduction in Seroquel. Likes the [place] as mood medication.   Overall mood and anxiety are under control.  Other concern is focus and memory. Wants to get off gabapentin and Seroquel for that reason.  Feels the meds may increase risk of dementia.  Overall she has seen some cognitive improvement since the reduction in Seroquel as noted. She was in courage to try reducing the Seroquel further gradually.  She was encouraged to leave the gabapentin at 300 mg daily because it was helpful.  Last seen March 14, 2020.  She was not doing well.  The following was noted: Couldn't sleep without Seroquel 200 mg.  Felt unstable and anxious.  For weeks felt she still struggled at 200 mg Seroquel and raised the dose all the way back up to where she started bc felt desperate and  was on edge.   Early 2000's not on Seroquel and had a lot of anxiety and obsessive thought.  It helps anxiety. Mood better on Seroquel and feels better physically and mentally.  A little down with stress and failure of switch to [place].  little anger but not much depression and no other sx of mania.    Driving anxiety and sense unreality resolved.  Fine with driving now. The following changes were made: Continue vitamin D from 5000 units daily to 10K units daily Wean Latuda to 40 mg for a week and then stop it. Then start Saphris 5mg  HS and reduce Seroquel 400 mg at night for 5 days, then increase Saphris 10 mg at night and reduce Seroquel to 200 mg at night for 5 days, then increase Saphris to 15 HS and stop sEroquel.  04/12/2020 appointment the following is noted: As noted pt has been wanting off Seroquel so med changes last visit wre intended to help. More anxious with reduced gabapentin and Seroquel.  Hard to stop Seroquel bc both anxiety and insomnia and using 100 mg Seroquel.  Missed a day of work over it yesterday.  More sleep with Saphris than Latuda.  No SE with saphris. Anxiety included fidgety. Hasn't tried Saphris 15 mg daily yet.   Has felt in a little less foggy headed with reduced Seroquel and gabapentin.  Down on gabapentin for a while now and only a little change with that reduction.  A little better focus and memory now. Neuropsych testing next week. Plan: Increase Saphris 15 mg HS and stop Seroquel again.  Can reduce Seroquel to 50 or even lower if needed.    05/09/2020 phone call with the following noted: Patient had to go back up to 600 mg Seroquel at hs, she has tried the last few nights at 500 mg. She's been unsuccessful tapering down and just wanted to have 200 mg and 100 mg tablets available to try and get it lowered.   07/07/2020 appointment with the following noted: Got close to off Seroquel but felt untethered and trouble with sleep initial and terminal. 5-6 hours on low  dose Seroquel. Never tried Saphris 15 mg for reasons that are unclear.  Stopped shortly after the phone session from last time. Started Seroquel by 2002 and gabapentin in 2003.  Finds it very difficult to get off the meds and worry over instability. Plan: Start Saphris 10 mg at night and reduce Seroquel to 400 mg nightly for 4 nights, Then increase Saphris to 15 mg at night and reduce Seroquel to 200 mg for 4 nights, Then reduce Seroquel to 100 mg and continue Saphris at 15 mg nightly for 4 nights, Then stop Seroquel and if insomnia occurs, increase Saphris to 20 mg at night.  07/19/2020 phone call.  Restless legs on Saphris.  Given instructions to change to loxapine 100 mg nightly.  07/25/2020 phone call patient stating she wanted to try to continue Saphris but have treatment for the restless legs side effects. Prescription sent for ropinirole  09/13/2020 appointment with the following noted: Not taking Saphris.  At 10 mg HS CO jitteriness and anxiety the next day. Never took loxapine. Wonders about taking loxapine now.    When manic feels more reckless, spending, talk to herself, driven, reduced sleep, irritable and angry.  Past Psychiatric Medication Trials: Vraylar 3 SE, Abilify, brief Latuda ? effect, Depakote NR, carbamazepine questionable side effects, lamotrigine lost response, Trileptal, clozapine, olanzapine, lithium no response, mirtazapine, Seroquel 600, risperidone 1 mg twice daily  , Saphris CO anxiety several SSRIs, clomipramine had vision side effects,  gabapentin, failed an attempt to switch from Seroquel to Vraylar early 2020  Failed switch from Seroquel to Latuda No psych hosp.  Review of Systems:  Review of Systems  Cardiovascular: Negative for chest pain and palpitations.  Neurological: Negative for dizziness, tremors, weakness and light-headedness.  Psychiatric/Behavioral: Negative for agitation, behavioral problems, confusion, decreased concentration, dysphoric  mood, hallucinations, self-injury, sleep disturbance and suicidal ideas. The patient is nervous/anxious. The patient is not hyperactive.     Medications: I have reviewed the patient's current medications.  Current Outpatient Medications  Medication Sig Dispense Refill  . atenolol (TENORMIN) 50 MG tablet Take 50 mg by mouth daily.    . B Complex Vitamins (VITAMIN B-COMPLEX) TABS Take by mouth.    . Cholecalciferol (VITAMIN D3) 5000 units CAPS Take 1 capsule by mouth daily.    Marland Kitchen gabapentin (NEURONTIN) 600 MG tablet Take 1 tablet (600 mg total) by mouth 2 (two) times daily. (Patient taking differently: Take 600 mg by mouth at bedtime. ) 180 tablet 1  . levothyroxine (SYNTHROID, LEVOTHROID) 112 MCG tablet Take 112 mcg by mouth daily before breakfast.    . linaclotide (LINZESS) 290 MCG CAPS capsule TAKE 1 CAPSULE DAILY    . lithium carbonate 150 MG capsule Take 1 capsule (150 mg total) by mouth daily in the afternoon. 90 capsule 3  . LORazepam (ATIVAN) 0.5 MG tablet Take 1-2 tablets (0.5-1 mg total) by mouth every 8 (eight) hours as needed for anxiety. 30 tablet 0  . memantine (NAMENDA) 10 MG tablet TAKE 1 TABLET TWICE A DAY 180 tablet 0  . mirtazapine (REMERON) 30 MG tablet TAKE 1 TABLET AT BEDTIME (Patient taking differently: Take 15 mg by mouth at bedtime. ) 90 tablet 1  . QUEtiapine (SEROQUEL) 200 MG tablet Take 2 tablets (400 mg total) by mouth at bedtime. (Patient taking differently: Take 600 mg by mouth at bedtime. ) 180 tablet 0  . temazepam (RESTORIL) 15 MG capsule at bedtime as needed.    . zaleplon (SONATA) 10 MG capsule Take 1 capsule (10 mg total) by mouth at bedtime as needed for sleep. 90 capsule 0  . Acetylcysteine (NAC) 600 MG CAPS Take 1 capsule (600 mg total) by mouth daily. 90 capsule 1  . loxapine (LOXITANE) 25 MG capsule 1 at night for 4 nights, then 2 at night for 4 nights, then 3 at night (Patient not taking: Reported on 09/13/2020) 90 capsule 0  . QUEtiapine (SEROQUEL) 100  MG tablet Take 1 tablet (100 mg total) by mouth at bedtime. (Patient not taking: Reported on 09/13/2020) 90 tablet 0  . rOPINIRole (REQUIP) 0.5 MG tablet TAKE 2 HOURS BEFORE RESTLESS STARTS: 1/2 TABLET FOR 1 NIGHT, THEN 1 TABLET FOR 4 NIGHTS, THEN 2 TABLET EVERY EVENING PRNQ (Patient not taking: Reported on  09/13/2020) 168 tablet 0   No current facility-administered medications for this visit.    Medication Side Effects: None  Allergies: No Known Allergies  History reviewed. No pertinent past medical history.  History reviewed. No pertinent family history.  Social History   Socioeconomic History  . Marital status: Divorced    Spouse name: Not on file  . Number of children: Not on file  . Years of education: Not on file  . Highest education level: Not on file  Occupational History  . Not on file  Tobacco Use  . Smoking status: Former Smoker    Quit date: 2000    Years since quitting: 21.7  . Smokeless tobacco: Never Used  Substance and Sexual Activity  . Alcohol use: Not on file  . Drug use: Not on file  . Sexual activity: Not on file  Other Topics Concern  . Not on file  Social History Narrative  . Not on file   Social Determinants of Health   Financial Resource Strain:   . Difficulty of Paying Living Expenses: Not on file  Food Insecurity:   . Worried About Programme researcher, broadcasting/film/video in the Last Year: Not on file  . Ran Out of Food in the Last Year: Not on file  Transportation Needs:   . Lack of Transportation (Medical): Not on file  . Lack of Transportation (Non-Medical): Not on file  Physical Activity:   . Days of Exercise per Week: Not on file  . Minutes of Exercise per Session: Not on file  Stress:   . Feeling of Stress : Not on file  Social Connections:   . Frequency of Communication with Friends and Family: Not on file  . Frequency of Social Gatherings with Friends and Family: Not on file  . Attends Religious Services: Not on file  . Active Member of Clubs or  Organizations: Not on file  . Attends Banker Meetings: Not on file  . Marital Status: Not on file  Intimate Partner Violence:   . Fear of Current or Ex-Partner: Not on file  . Emotionally Abused: Not on file  . Physically Abused: Not on file  . Sexually Abused: Not on file    Past Medical History, Surgical history, Social history, and Family history were reviewed and updated as appropriate.   Please see review of systems for further details on the patient's review from today.   Objective:   Physical Exam:  There were no vitals taken for this visit.  Physical Exam Neurological:     Mental Status: She is alert and oriented to person, place, and time.     Cranial Nerves: No dysarthria.  Psychiatric:        Attention and Perception: Attention normal.        Mood and Affect: Mood is anxious. Mood is not depressed.        Speech: Speech normal. Speech is not rapid and pressured.        Behavior: Behavior is cooperative.        Thought Content: Thought content normal. Thought content is not paranoid or delusional. Thought content does not include homicidal or suicidal ideation. Thought content does not include homicidal or suicidal plan.        Cognition and Memory: She exhibits impaired recent memory. She does not exhibit impaired remote memory.        Judgment: Judgment normal.     Comments: Insight and judgment appear good. Denies mania symptoms or severe depression  irritable.  She is also noticing less mood cycling on SERoquel.  Anxiety is worse     Lab Review:  No results found for: NA, K, CL, CO2, GLUCOSE, BUN, CREATININE, CALCIUM, PROT, ALBUMIN, AST, ALT, ALKPHOS, BILITOT, GFRNONAA, GFRAA  No results found for: WBC, RBC, HGB, HCT, PLT, MCV, MCH, MCHC, RDW, LYMPHSABS, MONOABS, EOSABS, BASOSABS  No results found for: POCLITH, LITHIUM   No results found for: PHENYTOIN, PHENOBARB, VALPROATE, CBMZ   Took lab orders to PCP from visit in September and reports  results: B12 >2000 Folate > 20 D 43.5 Iron TIBC 297 (250-450)  UIBC 156 (131-425)  Iron 141 (27-159)  Iron saturation 47 (15-55)   .res Assessment: Plan:    Bipolar I disorder (HCC)  Generalized anxiety disorder  Mixed obsessional thoughts and acts  Agoraphobia  MCI (mild cognitive impairment) - Plan: Acetylcysteine (NAC) 600 MG CAPS  Insomnia due to mental condition  Low vitamin D level   Greater than 50% of 30-minute video time with patient was spent on counseling and coordination of care. We discussed nature of her psychiatric diagnoses .   She remains motivated to get off Seroquel because she believes is causing cognitive problems. Bipolar disorder Was worse with reduction in Seroquel Failed attempt to transition to Saphris.  Discussed her questions about progression of underlying bipolar disorder and possible neuro degenerative changes.  We cannot separate her concerns about cognitive status and because until we switched her off at least the quetiapine and hopefully as well to gabapentin then we can be better able to assess cause and effect between cognitive problems related to underlying bipolar disorder versus medication side effect.  Option retry Abilify, Caplyta, Fanapt, loxapine, Saphris retrial if necessary.  Disc differences.  Failed multiple meds.  OK prn lorazepam.  Caution re driving and drowsiness.  We discussed the short-term risks associated with benzodiazepines including sedation and increased fall risk among others.  Discussed long-term side effect risk including dependence, potential withdrawal symptoms, and the potential eventual dose-related risk of dementia.  reduction in Neurontin OK but fears worsening anxiety.  She is doing okay with gabapentin 600 mg nightly.  She thinks perhaps the reduction in gabapentin has helped cognitions to some degree.  She is focusing on trying to reduce and eliminate the Seroquel at this time. Recommend no change in this  until get switch to Saphris.  Discussed potential metabolic side effects associated with atypical antipsychotics, as well as potential risk for movement side effects. Advised pt to contact office if movement side effects occur.   Disc cognitive risks at length with meds and risks of untreated bipolar disorder for cognition as well.  She is taking Namenda 10 mg twice daily off label for help with this symptom. She's FU with neuropsych in October Disc the off-label use of N-Acetylcysteine at 600 mg daily to help with mild cognitive problems.  It can be combined with a B-complex vitamin as the B-12 and folate have been shown to sometimes enhance the effect.  Disc availability problems with NAC.  She took it in the past.  Thyroid was normal recently. B12 and folate are normal D is OK but goal with her should be 50s-60s Disc reasons in detail including Dr. Charm BargesLapp's recommendation. .  We discussed there is a risk of increased mood swings or depression with this med change as we do not do not know ahead of time if she is going to be responsive to Saphris.  .  At the lower doses  of Seroquel sometimes patients will have difficulty dropping further because of insomnia that occurs which is the reason we selected Saphris because it is typically effective for sleep..  Discussed the importance of protecting her sleep for overall mood stability.  The risks of very low dosages such as 50-100 mg of Seroquel are likely very minimal and specifically minimal risk of EPS, weight gain or daytime cognitive problems.  Give her this change in instructions for transition from Seroquel to loxapine: Start loxapine 25 mg capsule 1 at night and reduce Seroquel to 400 mg nightly for 4 nights, Then increase loxapine to 2 capsules at night and reduce Seroquel to 200 mg for 4 nights, Then reduce Seroquel to 100 mg and increase loxapine to 3 capsules nightly for 4 nights, Then stop Seroquel and if insomnia occurs, increase loxapine to  4 capsules at night and notify MD.   Greater than 50% of 30-minutevideo time with patient was spent on counseling and coordination of care.   FU 1 months  Meredith Staggers, MD, DFAPA   Please see After Visit Summary for patient specific instructions.  No future appointments.  No orders of the defined types were placed in this encounter.     -------------------------------

## 2020-09-13 NOTE — Patient Instructions (Signed)
Give her this change in instructions for transition from Seroquel to loxapine: Start loxapine 25 mg capsule 1 at night and reduce Seroquel to 400 mg nightly for 4 nights, Then increase loxapine to 2 capsules at night and reduce Seroquel to 200 mg for 4 nights, Then reduce Seroquel to 100 mg and increase loxapine to 3 capsules nightly for 4 nights, Then stop Seroquel and if insomnia occurs, increase loxapine to 4 capsules at night and notify MD.

## 2020-10-14 ENCOUNTER — Telehealth: Payer: Self-pay | Admitting: Psychiatry

## 2020-10-14 ENCOUNTER — Other Ambulatory Visit: Payer: Self-pay

## 2020-10-14 DIAGNOSIS — F319 Bipolar disorder, unspecified: Secondary | ICD-10-CM

## 2020-10-14 MED ORDER — QUETIAPINE FUMARATE 200 MG PO TABS: 600.0000 mg | ORAL_TABLET | Freq: Every day | ORAL | 0 refills | Status: DC

## 2020-10-14 NOTE — Telephone Encounter (Signed)
Rx sent for Seroquel 200 mg 3 at hs

## 2020-10-14 NOTE — Telephone Encounter (Signed)
Pt would like a 30day supply of Seroquel called in at [facility] in [place].

## 2020-10-24 ENCOUNTER — Telehealth: Payer: Self-pay | Admitting: Psychiatry

## 2020-10-24 NOTE — Telephone Encounter (Signed)
Please review

## 2020-10-24 NOTE — Telephone Encounter (Signed)
Patient's next appt is 11/22/20. Since she is decreasing her Seroquil and increasing her Floxamine. She is asking if she can get a prescription for anxiety until she decreases her Seroquil? Her # is [phone]. Call to [facility] [phone]

## 2020-10-26 ENCOUNTER — Other Ambulatory Visit: Payer: Self-pay | Admitting: Psychiatry

## 2020-10-26 MED ORDER — LORAZEPAM 0.5 MG PO TABS: 0.5000 mg | ORAL_TABLET | Freq: Three times a day (TID) | ORAL | 0 refills | Status: DC | PRN

## 2020-10-26 NOTE — Telephone Encounter (Signed)
Telephone call to patient.  She experiences an increase in anxiety whenever she reduces the Seroquel.  She is just started the transition from Seroquel to loxapine.  She is tolerating the loxapine well.  Encouraged her to just use the lorazepam and transition from Seroquel to loxapine.  It is hoped and expected that the loxapine will effectively manage anxiety as the Seroquel when she is on the full dose.  She agrees to the plan.  Lorazepam prescription sent in.

## 2020-11-16 ENCOUNTER — Telehealth: Payer: Self-pay | Admitting: Psychiatry

## 2020-11-16 DIAGNOSIS — F3162 Bipolar disorder, current episode mixed, moderate: Secondary | ICD-10-CM

## 2020-11-16 MED ORDER — QUETIAPINE FUMARATE 25 MG PO TABS: ORAL_TABLET | ORAL | 0 refills | Status: DC

## 2020-11-16 NOTE — Telephone Encounter (Signed)
Received after hours call from patient. She reports that she is having possible discontinuation from Seroquel. She reports that she has been taking Seroquel for 20 years. She reports that she stopped Seroquel 100 mg po QHS Sunday and describes cross-titration from Seroquel to Luvox that is consistent with instructions given during last visit with Dr. Jennelle Human. She reports that she had increased anxiety today and felt shaky and "didn't feel safe... like something could happen to me... like I am kind of in a freefall.... like I might die" without SI.  She has been taking Lorazepam. She reports that taking Lorazepam has been somewhat helpful for anxiety/possible discontinuation. Able to sleep with Loxapine. She had anxiety in the morning when she took only 3 Loxapine the night prior, so she increased to 4 Loxapine on Monday and Tuesday night. Reports that she was "ok on Monday" and had more anxiety on Tuesday. She reports that she notices "physical anxiety."  PLAN: Re-start low dose Seroquel to improve possible discontinuation s/s. Discussed taking 50 mg po QHS tonight and then discussing further decreased titration with Dr. Jennelle Human. Will send in script for Seroquel 25 mg 1-2 tabs po QHS so that she will have lower strength tablets available for further decreased titration.

## 2020-11-17 NOTE — Telephone Encounter (Signed)
RTC  See prior note.  Took 50 mg Seroquel last night.  Realized she made a mistake in dosing.  She stopped from 200 mg Seroquel instead of reducing to 100 mg as instructed.  Had gotten up to 4 loxapine without seroquel was jittery and like she'd come out of her skin and took lorazepam.  Still some problems with memory.  Last night took 50 mg Seroquel and was a little better. Much better today than yesterday.   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   Memory testing with visuospatial problems and was worked up over that.   Take 4 loxapine and start 75 mg Seroquel and taper from there more slowly. Appt 11/22/20

## 2020-11-20 ENCOUNTER — Other Ambulatory Visit: Payer: Self-pay | Admitting: Psychiatry

## 2020-11-20 DIAGNOSIS — F319 Bipolar disorder, unspecified: Secondary | ICD-10-CM

## 2020-11-22 ENCOUNTER — Other Ambulatory Visit: Payer: Self-pay | Admitting: Psychiatry

## 2020-11-22 ENCOUNTER — Telehealth: Payer: Federal, State, Local not specified - PPO | Admitting: Psychiatry

## 2020-11-22 ENCOUNTER — Telehealth: Payer: Self-pay | Admitting: Psychiatry

## 2020-11-22 MED ORDER — LORAZEPAM 0.5 MG PO TABS: 0.5000 mg | ORAL_TABLET | Freq: Three times a day (TID) | ORAL | 0 refills | Status: DC | PRN

## 2020-11-22 NOTE — Telephone Encounter (Signed)
I sent in the prescription for lorazepam.  Patient is not on quetiapine 200 mg 3 daily and I will not prescribe that dose.  Ask her what dose of quetiapine she is actually taking.  Also the insurance will not cover 4 loxapine capsules daily.  If she is taking 4 I will need to change it to 2 of the 50 mg capsules.  Please verify her current dose of loxapine and quetiapine.  I am weaning her off of quetiapine she should not be on that high of a dose of quetiapine.

## 2020-11-22 NOTE — Telephone Encounter (Addendum)
Pt requesting Rx for Loxapine 25 mg 4/d  30 day and Seroquel 200 mg 3/d   30 day and Lorazepam 0.5 mg 1-2 tab every 8 hrs PRN for anxiety  @ [facility] [employer] on file. Apt RS from 12/21 to 2/9 and on canc list. Pt going out of town 12/27-1/3 asking for Rx before 12/27.

## 2020-11-23 ENCOUNTER — Other Ambulatory Visit: Payer: Self-pay | Admitting: Psychiatry

## 2020-11-23 ENCOUNTER — Telehealth: Payer: Self-pay | Admitting: Psychiatry

## 2020-11-23 DIAGNOSIS — F319 Bipolar disorder, unspecified: Secondary | ICD-10-CM

## 2020-11-23 MED ORDER — QUETIAPINE FUMARATE 200 MG PO TABS: 600.0000 mg | ORAL_TABLET | Freq: Every day | ORAL | 0 refills | Status: DC

## 2020-11-23 NOTE — Telephone Encounter (Signed)
Pt called back stating she would like to stop Loxapine completely. She is having blurred vision and jittery. She did not pick up Loxapine Rx. She is currently taking quetiapine 100 mg (4-25 mg tabs daily). Requesting to go back to 600 mg daily. Requesting Rx for this.

## 2020-11-23 NOTE — Telephone Encounter (Signed)
Thanks.  DC loxapine and RX sent for quetiapine 200 mg 3 nightly

## 2020-12-01 ENCOUNTER — Telehealth: Payer: Self-pay | Admitting: Psychiatry

## 2020-12-01 NOTE — Telephone Encounter (Signed)
Kristen Leon called to request that you decrease her gabapentin and prescribe Buspar.  Please call to discuss this.  appt 01/11/21.  [facility] in [place]

## 2020-12-05 ENCOUNTER — Other Ambulatory Visit: Payer: Self-pay | Admitting: Psychiatry

## 2020-12-05 MED ORDER — BUSPIRONE HCL 15 MG PO TABS: ORAL_TABLET | ORAL | 1 refills | Status: DC

## 2020-12-05 NOTE — Telephone Encounter (Signed)
RTC  Took buspar years ago and wants to try it again and reduce gabapentin.  Currently on 1200 mg HS.  Reduce to 900 mg gabapentin.   Start buspirone 5 mg twice daily, and increase to 15 mg BID. Disc SE.  She agrees.

## 2020-12-16 ENCOUNTER — Other Ambulatory Visit: Payer: Self-pay | Admitting: Psychiatry

## 2020-12-27 ENCOUNTER — Other Ambulatory Visit: Payer: Self-pay | Admitting: Psychiatry

## 2020-12-27 DIAGNOSIS — F319 Bipolar disorder, unspecified: Secondary | ICD-10-CM

## 2021-01-11 ENCOUNTER — Telehealth (INDEPENDENT_AMBULATORY_CARE_PROVIDER_SITE_OTHER): Payer: Federal, State, Local not specified - PPO | Admitting: Psychiatry

## 2021-01-11 ENCOUNTER — Encounter: Payer: Self-pay | Admitting: Psychiatry

## 2021-01-11 DIAGNOSIS — F411 Generalized anxiety disorder: Secondary | ICD-10-CM | POA: Diagnosis not present

## 2021-01-11 DIAGNOSIS — F3162 Bipolar disorder, current episode mixed, moderate: Secondary | ICD-10-CM

## 2021-01-11 DIAGNOSIS — F422 Mixed obsessional thoughts and acts: Secondary | ICD-10-CM

## 2021-01-11 DIAGNOSIS — G3184 Mild cognitive impairment, so stated: Secondary | ICD-10-CM

## 2021-01-11 DIAGNOSIS — F5105 Insomnia due to other mental disorder: Secondary | ICD-10-CM

## 2021-01-11 DIAGNOSIS — F4000 Agoraphobia, unspecified: Secondary | ICD-10-CM | POA: Diagnosis not present

## 2021-01-11 NOTE — Progress Notes (Signed)
Kristen Leon [id number] 1966-03-18 55 y.o.  Video Visit via My Chart  I connected with pt by My Chart and verified that I am speaking with the correct person using two identifiers.   I discussed the limitations, risks, security and privacy concerns of performing an evaluation and management service by My Chart  and the availability of in person appointments. I also discussed with the patient that there may be a patient responsible charge related to this service. The patient expressed understanding and agreed to proceed.  I discussed the assessment and treatment plan with the patient. The patient was provided an opportunity to ask questions and all were answered. The patient agreed with the plan and demonstrated an understanding of the instructions.   The patient was advised to call back or seek an in-person evaluation if the symptoms worsen or if the condition fails to improve as anticipated.  I provided 30 minutes of video time during this encounter.  The patient was located at home and the provider was located office. Session started 1045 and ended at 1120  Subjective:   Patient ID:  Kristen Leon is a 55 y.o. (DOB [birth year]) female.  Chief Complaint:  Chief Complaint  Patient presents with  . Follow-up  . Bipolar I disorder (HCC)  . Medication Problem  . Sleeping Problem  . Anxiety    Medication Refill Pertinent negatives include no chest pain or weakness.     [name] presents  today for follow-up ofBipolar disorder, generalized anxiety disorder, nocturnal panic attacks, history of OCD, and mild cognitive impairment and worsening driving anxiety.  She has a goal of getting off the Seroquel in hopes of improving memory.  at visit August 06, 2019.  We needed to start a mood stabilizer and discussed variety of options and decided to start with Latuda.  We started at 20 mg and the plan was to increase gradually to 80 mg daily.  If that was successful this follow-up was  going to lead to a potential reduction in an attempt to wean Seroquel. Never got up to 80 mg Latuda.    Did not noticed much of a difference.  visit September 03, 2019.  The following changes were made: Increase vitamin D from 5000 units daily to 10K units daily Start Latuda  80 mg daily Start reduction in Seroquel next week and reduce once every 3 weeks. Reduced Seroquel from 600 to 200 mg HS.  At first had sleep and mood problems and angry.  Added in  gabapentin 300 mg total week ago and sleep better.  Able to sleep on her own.  Mood is better also.  visit October 12, 2019.  The following changes were made: Increase vitamin D from 5000 units daily to 10K units daily Continue Latuda  80 mg daily Continue gradual reduction in Seroquel next week and reduce once every 3 to 4 weeks by 50 mg. Trying to get rid of gabapentin and Seroquel for cognitive reasons primarily  seen December 16, 2019.  The following was noted: Down to Seroquel 150 HS for 3 weeks.  Quality of sleepy slightly off.  How long on this dose?  Tends to wake 30 min earlier than usual.  Bought magnesium.   Feels fine in the day.  Slightly less harried and mentally cloudy with reduction in Seroquel. Likes the [place] as mood medication.   Overall mood and anxiety are under control.  Other concern is focus and memory. Wants to get off gabapentin and Seroquel for that  reason.  Feels the meds may increase risk of dementia.  Overall she has seen some cognitive improvement since the reduction in Seroquel as noted. She was in courage to try reducing the Seroquel further gradually.  She was encouraged to leave the gabapentin at 300 mg daily because it was helpful.  Last seen March 14, 2020.  She was not doing well.  The following was noted: Couldn't sleep without Seroquel 200 mg.  Felt unstable and anxious.  For weeks felt she still struggled at 200 mg Seroquel and raised the dose all the way back up to where she started bc felt desperate and  was on edge.   Early 2000's not on Seroquel and had a lot of anxiety and obsessive thought.  It helps anxiety. Mood better on Seroquel and feels better physically and mentally.  A little down with stress and failure of switch to Jordan.  little anger but not much depression and no other sx of mania.    Driving anxiety and sense unreality resolved.  Fine with driving now. The following changes were made: Continue vitamin D from 5000 units daily to 10K units daily Wean Latuda to 40 mg for a week and then stop it. Then start Saphris 5mg  HS and reduce Seroquel 400 mg at night for 5 days, then increase Saphris 10 mg at night and reduce Seroquel to 200 mg at night for 5 days, then increase Saphris to 15 HS and stop sEroquel.  04/12/2020 appointment the following is noted: As noted pt has been wanting off Seroquel so med changes last visit wre intended to help. More anxious with reduced gabapentin and Seroquel.  Hard to stop Seroquel bc both anxiety and insomnia and using 100 mg Seroquel.  Missed a day of work over it yesterday.  More sleep with Saphris than Latuda.  No SE with saphris. Anxiety included fidgety. Hasn't tried Saphris 15 mg daily yet.   Has felt in a little less foggy headed with reduced Seroquel and gabapentin.  Down on gabapentin for a while now and only a little change with that reduction.  A little better focus and memory now. Neuropsych testing next week. Plan: Increase Saphris 15 mg HS and stop Seroquel again.  Can reduce Seroquel to 50 or even lower if needed.    05/09/2020 phone call with the following noted: Patient had to go back up to 600 mg Seroquel at hs, she has tried the last few nights at 500 mg. She's been unsuccessful tapering down and just wanted to have 200 mg and 100 mg tablets available to try and get it lowered.   07/07/2020 appointment with the following noted: Got close to off Seroquel but felt untethered and trouble with sleep initial and terminal. 5-6 hours on low  dose Seroquel. Never tried Saphris 15 mg for reasons that are unclear.  Stopped shortly after the phone session from last time. Started Seroquel by 2002 and gabapentin in 2003.  Finds it very difficult to get off the meds and worry over instability. Plan: Start Saphris 10 mg at night and reduce Seroquel to 400 mg nightly for 4 nights, Then increase Saphris to 15 mg at night and reduce Seroquel to 200 mg for 4 nights, Then reduce Seroquel to 100 mg and continue Saphris at 15 mg nightly for 4 nights, Then stop Seroquel and if insomnia occurs, increase Saphris to 20 mg at night.  07/19/2020 phone call.  Restless legs on Saphris.  Given instructions to change to loxapine 100  mg nightly. 07/25/2020 phone call patient stating she wanted to try to continue Saphris but have treatment for the restless legs side effects. Prescription sent for ropinirole  09/13/2020 appointment with the following noted: Not taking Saphris.  At 10 mg HS CO jitteriness and anxiety the next day. Never took loxapine. Wonders about taking loxapine now.  Plan: Give her this change in instructions for transition from Seroquel to loxapine: Start loxapine 25 mg capsule 1 at night and reduce Seroquel to 400 mg nightly for 4 nights, Then increase loxapine to 2 capsules at night and reduce Seroquel to 200 mg for 4 nights, Then reduce Seroquel to 100 mg and increase loxapine to 3 capsules nightly for 4 nights, Then stop Seroquel and if insomnia occurs, increase loxapine to 4 capsules at night and notify MD.   Multiple phone calls since the last visit including the following. 10/24/2020:Telephone call to patient.  She experiences an increase in anxiety whenever she reduces the Seroquel.  She is just started the transition from Seroquel to loxapine.  She is tolerating the loxapine well.  Encouraged her to just use the lorazepam and transition from Seroquel to loxapine.  It is hoped and expected that the loxapine will effectively manage  anxiety as the Seroquel when she is on the full dose.  She agrees to the plan.  Lorazepam prescription sent in. 11/17/2020: RTC See prior note.  Took 50 mg Seroquel last night.  Realized she made a mistake in dosing.  She stopped from 200 mg Seroquel instead of reducing to 100 mg as instructed.  Had gotten up to 4 loxapine without seroquel was jittery and like she'd come out of her skin and took lorazepam.  Still some problems with memory.  Last night took 50 mg Seroquel and was a little better. Much better today than yesterday.   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   Memory testing with visuospatial problems and was worked up over that.   Take 4 loxapine and start 75 mg Seroquel and taper from there more slowly. Appt 11/22/20  11/22/2020:Pt requesting Rx for Loxapine 25 mg 4/d  30 day and Seroquel 200 mg 3/d   30 day and Lorazepam 0.5 mg 1-2 tab every 8 hrs PRN for anxiety  @ [facility] [employer] on file. Apt RS from 12/21 to 2/9 and on canc list. Pt going out of town 12/27-1/3 asking for Rx before 12/27.  11/22/2020 MD response: I sent in the prescription for lorazepam.  Patient is not on quetiapine 200 mg 3 daily and I will not prescribe that dose.  Ask her what dose of quetiapine she is actually taking.  Also the insurance will not cover 4 loxapine capsules daily.  If she is taking 4 I will need to change it to 2 of the 50 mg capsules.  Please verify her current dose of loxapine and quetiapine.  I am weaning her off of quetiapine she should not be on that high of a dose of quetiapine. 11/23/2020:Pt called back stating she would like to stop Loxapine completely. She is having blurred vision and jittery. She did not pick up Loxapine Rx. She is currently taking quetiapine 100 mg (4-25 mg tabs daily). Requesting to go back to 600 mg daily. Requesting Rx for this.  Agreed 12/01/2020:[facility] called to request that you decrease her gabapentin and  prescribe Buspar.  Please call to discuss this.  appt 01/11/21.  MD response: Took buspar years  ago and wants to try it again and reduce gabapentin.  Currently on 1200 mg HS. Reduce to 900 mg gabapentin.   Start buspirone 5 mg twice daily, and increase to 15 mg BID. Disc SE. She agrees.  01/11/2021 appointment with the following noted: Completely off gabapentin for a couple of weeks maybe.  Initially anxiety a little high in AM.  Increased buspirone. Had increase anxiety when off Seroquel.  Was off about 4 days and memory was not better.  Anxiety is better now. Anxiety is a little higher off gabapentin but not unmanageable.  Sleep is not as good off the gabapentin.  More initial insomnia and takes melatonin to manage. Not marked difference with cognition off gabapentin. Read about antipsychotics reducing brain volume and still wants to stop Seroquel despite multiple failed attempts. Wants to switch from Seroquel to trazodone.   When manic feels more reckless, spending, talk to herself, driven, reduced sleep, irritable and angry.  Past Psychiatric Medication Trials: Vraylar 3 SE, Abilify, brief Latuda ? effect,  Depakote NR, carbamazepine questionable side effects, lamotrigine lost response, Trileptal,  lithium no response, clozapine, olanzapine mirtazapine, Seroquel 600, risperidone 1 mg twice daily  , Saphris CO anxiety several SSRIs, clomipramine had vision side effects,  gabapentin, failed an attempt to switch from Seroquel to Vraylar early 2020  Failed switch from Seroquel to Jordan, Saphris, Vraylar and loxapine No psych hosp.  Review of Systems:  Review of Systems  Cardiovascular: Negative for chest pain and palpitations.  Neurological: Negative for dizziness, tremors, weakness and light-headedness.  Psychiatric/Behavioral: Negative for agitation, behavioral problems, confusion, decreased concentration, dysphoric mood, hallucinations, self-injury, sleep disturbance and suicidal  ideas. The patient is nervous/anxious. The patient is not hyperactive.     Medications: I have reviewed the patient's current medications.  Current Outpatient Medications  Medication Sig Dispense Refill  . atenolol (TENORMIN) 50 MG tablet Take 50 mg by mouth daily.    . B Complex Vitamins (VITAMIN B-COMPLEX) TABS Take by mouth.    . busPIRone (BUSPAR) 15 MG tablet 1/3 tablet twice daily for 5 days, then 2/3 tablet twice daily for 5 days, then 1 twice daily (Patient taking differently: Take 15 mg by mouth 2 (two) times daily.) 60 tablet 1  . Cholecalciferol (VITAMIN D3) 5000 units CAPS Take 1 capsule by mouth daily.    Marland Kitchen levothyroxine (SYNTHROID, LEVOTHROID) 112 MCG tablet Take 112 mcg by mouth daily before breakfast.    . linaclotide (LINZESS) 290 MCG CAPS capsule TAKE 1 CAPSULE DAILY    . lithium carbonate 150 MG capsule Take 1 capsule (150 mg total) by mouth daily in the afternoon. 90 capsule 3  . LORazepam (ATIVAN) 0.5 MG tablet Take 1-2 tablets (0.5-1 mg total) by mouth every 8 (eight) hours as needed for anxiety. 50 tablet 0  . memantine (NAMENDA) 10 MG tablet TAKE 1 TABLET TWICE A DAY 180 tablet 0  . mirtazapine (REMERON) 30 MG tablet TAKE 1 TABLET AT BEDTIME (Patient taking differently: Take 15 mg by mouth at bedtime.) 90 tablet 1  . QUEtiapine (SEROQUEL) 200 MG tablet TAKE 3 TABLETS(600 MG) BY MOUTH AT BEDTIME 90 tablet 0  . temazepam (RESTORIL) 15 MG capsule at bedtime as needed.    . zaleplon (SONATA) 10 MG capsule Take 1 capsule (10 mg total) by mouth at bedtime as needed for sleep. 90 capsule 0  . Acetylcysteine (NAC) 600 MG CAPS Take 1 capsule (600 mg total) by mouth daily. 90 capsule 1  . gabapentin (NEURONTIN) 600  MG tablet TAKE 1 TABLET TWICE A DAY (Patient not taking: Reported on 01/11/2021) 180 tablet 0  . rOPINIRole (REQUIP) 0.5 MG tablet TAKE 2 HOURS BEFORE RESTLESS STARTS: 1/2 TABLET FOR 1 NIGHT, THEN 1 TABLET FOR 4 NIGHTS, THEN 2 TABLET EVERY EVENING PRNQ (Patient not  taking: No sig reported) 168 tablet 0   No current facility-administered medications for this visit.    Medication Side Effects: None  Allergies: No Known Allergies  History reviewed. No pertinent past medical history.  History reviewed. No pertinent family history.  Social History   Socioeconomic History  . Marital status: Divorced    Spouse name: Not on file  . Number of children: Not on file  . Years of education: Not on file  . Highest education level: Not on file  Occupational History  . Not on file  Tobacco Use  . Smoking status: Former Smoker    Quit date: 2000    Years since quitting: 22.1  . Smokeless tobacco: Never Used  Substance and Sexual Activity  . Alcohol use: Not on file  . Drug use: Not on file  . Sexual activity: Not on file  Other Topics Concern  . Not on file  Social History Narrative  . Not on file   Social Determinants of Health   Financial Resource Strain: Not on file  Food Insecurity: Not on file  Transportation Needs: Not on file  Physical Activity: Not on file  Stress: Not on file  Social Connections: Not on file  Intimate Partner Violence: Not on file    Past Medical History, Surgical history, Social history, and Family history were reviewed and updated as appropriate.   Please see review of systems for further details on the patient's review from today.   Objective:   Physical Exam:  There were no vitals taken for this visit.  Physical Exam Neurological:     Mental Status: She is alert and oriented to person, place, and time.     Cranial Nerves: No dysarthria.  Psychiatric:        Attention and Perception: Attention normal.        Mood and Affect: Mood is anxious. Mood is not depressed.        Speech: Speech normal. Speech is not rapid and pressured.        Behavior: Behavior is cooperative.        Thought Content: Thought content normal. Thought content is not paranoid or delusional. Thought content does not include  homicidal or suicidal ideation. Thought content does not include homicidal or suicidal plan.        Cognition and Memory: She exhibits impaired recent memory. She does not exhibit impaired remote memory.        Judgment: Judgment normal.     Comments: Insight and judgment appear good. Denies mania symptoms or severe depression irritable.  She is also noticing less mood cycling on SERoquel.  Anxiety is worse     Lab Review:  No results found for: NA, K, CL, CO2, GLUCOSE, BUN, CREATININE, CALCIUM, PROT, ALBUMIN, AST, ALT, ALKPHOS, BILITOT, GFRNONAA, GFRAA  No results found for: WBC, RBC, HGB, HCT, PLT, MCV, MCH, MCHC, RDW, LYMPHSABS, MONOABS, EOSABS, BASOSABS  No results found for: POCLITH, LITHIUM   No results found for: PHENYTOIN, PHENOBARB, VALPROATE, CBMZ   Took lab orders to PCP from visit in September and reports results: B12 >2000 Folate > 20 D 43.5 Iron TIBC 297 (250-450)  UIBC 156 (131-425)  Iron 141 (27-159)  Iron saturation 47 (15-55)   .res Assessment: Plan:    Bipolar 1 disorder, mixed, moderate (HCC)  Generalized anxiety disorder  Mixed obsessional thoughts and acts  Agoraphobia  MCI (mild cognitive impairment)  Insomnia due to mental condition   Greater than 50% of 30-minute video time with patient was spent on counseling and coordination of care. We discussed nature of her psychiatric diagnoses .   She remains motivated to get off Seroquel because she believes is causing cognitive problems. Bipolar disorder Was worse with reduction in Seroquel Failed switch from Seroquel to JordanLatuda, Saphris, Vraylar and loxapine   Wants to switch from Seroquel to trazodone. Again reviewed she's failed all mood stabilizers .  Explained there are no mood stabilizers left that are not also antipsychotics.  She then wanted to consider retrying lithium.  Chart says it failed but details are not available to me at this time.  Can review for next time.  Discussed her questions  about progression of underlying bipolar disorder and possible neuro degenerative changes.  She is read that antipsychotics reduce brain volume.  This is controversial because the conditions that require antipsychotics themselves such as schizophrenia and bipolar disorder themselves reduce brain volume so there is debate on that subject.  Furthermore there appears to be no other option for mood stabilization in her case besides an antipsychotic.  So instead we will attempt to reduce the Seroquel to the lowest effective dose.  She is failed attempts to get off Seroquel multiple times as noted above.  OK trial Seroquel reduction to 400 mg daily with no other med changes. Continue buspar 15 mg BID  Option retry Abilify, Caplyta, Fanapt, Saphris retrial if necessary.  Disc differences.  Failed multiple meds.  OK prn lorazepam.  Caution re driving and drowsiness.  We discussed the short-term risks associated with benzodiazepines including sedation and increased fall risk among others.  Discussed long-term side effect risk including dependence, potential withdrawal symptoms, and the potential eventual dose-related risk of dementia.  Discussed potential metabolic side effects associated with atypical antipsychotics, as well as potential risk for movement side effects. Advised pt to contact office if movement side effects occur.   Disc cognitive risks at length with meds and risks of untreated bipolar disorder for cognition as well.  She is taking Namenda 10 mg twice daily off label for help with this symptom. She's FU with neuropsych in October Disc the off-label use of N-Acetylcysteine at 600 mg daily to help with mild cognitive problems.  It can be combined with a B-complex vitamin as the B-12 and folate have been shown to sometimes enhance the effect.  Disc availability problems with NAC.  She took it in the past.  Thyroid was normal recently. B12 and folate are normal D is OK but goal with her should be  50s-60s Disc reasons in detail including Dr. Charm BargesLapp's recommendation. .  Disc neuroprotective literature on lithium. Dr. Arlie SolomonsPost article  Greater than 50% of 30-minutevideo time with patient was spent on counseling and coordination of care.   FU 1 months  Meredith Staggersarey Cottle, MD, DFAPA   Please see After Visit Summary for patient specific instructions.  No future appointments.  No orders of the defined types were placed in this encounter.     -------------------------------

## 2021-01-27 ENCOUNTER — Telehealth: Payer: Self-pay | Admitting: Psychiatry

## 2021-01-27 ENCOUNTER — Other Ambulatory Visit: Payer: Self-pay

## 2021-01-27 DIAGNOSIS — F319 Bipolar disorder, unspecified: Secondary | ICD-10-CM

## 2021-01-27 MED ORDER — QUETIAPINE FUMARATE 200 MG PO TABS: ORAL_TABLET | ORAL | 0 refills | Status: DC

## 2021-01-27 NOTE — Telephone Encounter (Signed)
Thanks

## 2021-01-27 NOTE — Telephone Encounter (Signed)
Next visit is 03/22/21. Requesting refill on Seroquel called to [facility], phone number is [phone]. She is requesting 90 days.

## 2021-01-27 NOTE — Telephone Encounter (Signed)
OK send 90 day Rx

## 2021-01-27 NOTE — Telephone Encounter (Signed)
Rx sent 

## 2021-02-08 ENCOUNTER — Ambulatory Visit: Payer: Federal, State, Local not specified - PPO | Admitting: Psychiatry

## 2021-02-17 ENCOUNTER — Encounter: Payer: Self-pay | Admitting: Psychiatry

## 2021-02-17 ENCOUNTER — Telehealth (INDEPENDENT_AMBULATORY_CARE_PROVIDER_SITE_OTHER): Payer: Federal, State, Local not specified - PPO | Admitting: Psychiatry

## 2021-02-17 DIAGNOSIS — F4000 Agoraphobia, unspecified: Secondary | ICD-10-CM | POA: Diagnosis not present

## 2021-02-17 DIAGNOSIS — G3184 Mild cognitive impairment, so stated: Secondary | ICD-10-CM

## 2021-02-17 DIAGNOSIS — Z79899 Other long term (current) drug therapy: Secondary | ICD-10-CM

## 2021-02-17 DIAGNOSIS — F422 Mixed obsessional thoughts and acts: Secondary | ICD-10-CM | POA: Diagnosis not present

## 2021-02-17 DIAGNOSIS — R7989 Other specified abnormal findings of blood chemistry: Secondary | ICD-10-CM

## 2021-02-17 DIAGNOSIS — F411 Generalized anxiety disorder: Secondary | ICD-10-CM | POA: Diagnosis not present

## 2021-02-17 DIAGNOSIS — F5105 Insomnia due to other mental disorder: Secondary | ICD-10-CM

## 2021-02-17 DIAGNOSIS — F3162 Bipolar disorder, current episode mixed, moderate: Secondary | ICD-10-CM | POA: Diagnosis not present

## 2021-02-17 MED ORDER — TRAZODONE HCL 100 MG PO TABS: 100.0000 mg | ORAL_TABLET | Freq: Every day | ORAL | 0 refills | Status: DC

## 2021-02-17 MED ORDER — LITHIUM CARBONATE 300 MG PO CAPS: ORAL_CAPSULE | ORAL | 0 refills | Status: DC

## 2021-02-17 MED ORDER — QUETIAPINE FUMARATE 300 MG PO TABS: 300.0000 mg | ORAL_TABLET | Freq: Every day | ORAL | 1 refills | Status: DC

## 2021-02-17 NOTE — Progress Notes (Signed)
Kristen Leon [id number] [birth year] 55 y.o.  Video Visit via My Chart  I connected with pt by My Chart and verified that I am speaking with the correct person using two identifiers.   I discussed the limitations, risks, security and privacy concerns of performing an evaluation and management service by My Chart  and the availability of in person appointments. I also discussed with the patient that there may be a patient responsible charge related to this service. The patient expressed understanding and agreed to proceed.  I discussed the assessment and treatment plan with the patient. The patient was provided an opportunity to ask questions and all were answered. The patient agreed with the plan and demonstrated an understanding of the instructions.   The patient was advised to call back or seek an in-person evaluation if the symptoms worsen or if the condition fails to improve as anticipated.  I provided 30 minutes of video time during this encounter.  The patient was located at home and the provider was located office. Session started 1045 and ended at 1115  Subjective:   Patient ID:  Kristen Leon is a 55 y.o. (DOB Feb 11, 1966) female.  Chief Complaint:  Chief Complaint  Patient presents with  . Follow-up  . Bipolar 1 disorder, mixed, moderate (HCC)  . Anxiety    Medication Refill Pertinent negatives include no chest pain or weakness.     Kristen Leon presents  today for follow-up ofBipolar disorder, generalized anxiety disorder, nocturnal panic attacks, history of OCD, and mild cognitive impairment and worsening driving anxiety.  She has a goal of getting off the Seroquel in hopes of improving memory.  at visit August 06, 2019.  We needed to start a mood stabilizer and discussed variety of options and decided to start with Latuda.  We started at 20 mg and the plan was to increase gradually to 80 mg daily.  If that was successful this follow-up was going to lead to a potential  reduction in an attempt to wean Seroquel. Never got up to 80 mg Latuda.    Did not noticed much of a difference.  visit September 03, 2019.  The following changes were made: Increase vitamin D from 5000 units daily to 10K units daily Start Latuda  80 mg daily Start reduction in Seroquel next week and reduce once every 3 weeks. Reduced Seroquel from 600 to 200 mg HS.  At first had sleep and mood problems and angry.  Added in  gabapentin 300 mg total week ago and sleep better.  Able to sleep on her own.  Mood is better also.  visit October 12, 2019.  The following changes were made: Increase vitamin D from 5000 units daily to 10K units daily Continue Latuda  80 mg daily Continue gradual reduction in Seroquel next week and reduce once every 3 to 4 weeks by 50 mg. Trying to get rid of gabapentin and Seroquel for cognitive reasons primarily  seen December 16, 2019.  The following was noted: Down to Seroquel 150 HS for 3 weeks.  Quality of sleepy slightly off.  How long on this dose?  Tends to wake 30 min earlier than usual.  Bought magnesium.   Feels fine in the day.  Slightly less harried and mentally cloudy with reduction in Seroquel. Likes the [place] as mood medication.   Overall mood and anxiety are under control.  Other concern is focus and memory. Wants to get off gabapentin and Seroquel for that reason.  Feels the meds may  increase risk of dementia.  Overall she has seen some cognitive improvement since the reduction in Seroquel as noted. She was in courage to try reducing the Seroquel further gradually.  She was encouraged to leave the gabapentin at 300 mg daily because it was helpful.  Last seen March 14, 2020.  She was not doing well.  The following was noted: Couldn't sleep without Seroquel 200 mg.  Felt unstable and anxious.  For weeks felt she still struggled at 200 mg Seroquel and raised the dose all the way back up to where she started bc felt desperate and was on edge.   Early 2000's  not on Seroquel and had a lot of anxiety and obsessive thought.  It helps anxiety. Mood better on Seroquel and feels better physically and mentally.  A little down with stress and failure of switch to Jordan.  little anger but not much depression and no other sx of mania.    Driving anxiety and sense unreality resolved.  Fine with driving now. The following changes were made: Continue vitamin D from 5000 units daily to 10K units daily Wean Latuda to 40 mg for a week and then stop it. Then start Saphris 5mg  HS and reduce Seroquel 400 mg at night for 5 days, then increase Saphris 10 mg at night and reduce Seroquel to 200 mg at night for 5 days, then increase Saphris to 15 HS and stop sEroquel.  04/12/2020 appointment the following is noted: As noted pt has been wanting off Seroquel so med changes last visit wre intended to help. More anxious with reduced gabapentin and Seroquel.  Hard to stop Seroquel bc both anxiety and insomnia and using 100 mg Seroquel.  Missed a day of work over it yesterday.  More sleep with Saphris than Latuda.  No SE with saphris. Anxiety included fidgety. Hasn't tried Saphris 15 mg daily yet.   Has felt in a little less foggy headed with reduced Seroquel and gabapentin.  Down on gabapentin for a while now and only a little change with that reduction.  A little better focus and memory now. Neuropsych testing next week. Plan: Increase Saphris 15 mg HS and stop Seroquel again.  Can reduce Seroquel to 50 or even lower if needed.    05/09/2020 phone call with the following noted: Patient had to go back up to 600 mg Seroquel at hs, she has tried the last few nights at 500 mg. She's been unsuccessful tapering down and just wanted to have 200 mg and 100 mg tablets available to try and get it lowered.   07/07/2020 appointment with the following noted: Got close to off Seroquel but felt untethered and trouble with sleep initial and terminal. 5-6 hours on low dose Seroquel. Never tried  Saphris 15 mg for reasons that are unclear.  Stopped shortly after the phone session from last time. Started Seroquel by 2002 and gabapentin in 2003.  Finds it very difficult to get off the meds and worry over instability. Plan: Start Saphris 10 mg at night and reduce Seroquel to 400 mg nightly for 4 nights, Then increase Saphris to 15 mg at night and reduce Seroquel to 200 mg for 4 nights, Then reduce Seroquel to 100 mg and continue Saphris at 15 mg nightly for 4 nights, Then stop Seroquel and if insomnia occurs, increase Saphris to 20 mg at night.  07/19/2020 phone call.  Restless legs on Saphris.  Given instructions to change to loxapine 100 mg nightly. 07/25/2020 phone call patient  stating she wanted to try to continue Saphris but have treatment for the restless legs side effects. Prescription sent for ropinirole  09/13/2020 appointment with the following noted: Not taking Saphris.  At 10 mg HS CO jitteriness and anxiety the next day. Never took loxapine. Wonders about taking loxapine now.  Plan: Give her this change in instructions for transition from Seroquel to loxapine: Start loxapine 25 mg capsule 1 at night and reduce Seroquel to 400 mg nightly for 4 nights, Then increase loxapine to 2 capsules at night and reduce Seroquel to 200 mg for 4 nights, Then reduce Seroquel to 100 mg and increase loxapine to 3 capsules nightly for 4 nights, Then stop Seroquel and if insomnia occurs, increase loxapine to 4 capsules at night and notify MD.   Multiple phone calls since the last visit including the following. 10/24/2020:Telephone call to patient.  She experiences an increase in anxiety whenever she reduces the Seroquel.  She is just started the transition from Seroquel to loxapine.  She is tolerating the loxapine well.  Encouraged her to just use the lorazepam and transition from Seroquel to loxapine.  It is hoped and expected that the loxapine will effectively manage anxiety as the Seroquel when  she is on the full dose.  She agrees to the plan.  Lorazepam prescription sent in. 11/17/2020: RTC See prior note.  Took 50 mg Seroquel last night.  Realized she made a mistake in dosing.  She stopped from 200 mg Seroquel instead of reducing to 100 mg as instructed.  Had gotten up to 4 loxapine without seroquel was jittery and like she'd come out of her skin and took lorazepam.  Still some problems with memory.  Last night took 50 mg Seroquel and was a little better. Much better today than yesterday.   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   Memory testing with visuospatial problems and was worked up over that.   Take 4 loxapine and start 75 mg Seroquel and taper from there more slowly. Appt 11/22/20  11/22/2020:Pt requesting Rx for Loxapine 25 mg 4/d  30 day and Seroquel 200 mg 3/d   30 day and Lorazepam 0.5 mg 1-2 tab every 8 hrs PRN for anxiety  @ [facility] [employer] on file. Apt RS from 12/21 to 2/9 and on canc list. Pt going out of town 12/27-1/3 asking for Rx before 12/27.  11/22/2020 MD response: I sent in the prescription for lorazepam.  Patient is not on quetiapine 200 mg 3 daily and I will not prescribe that dose.  Ask her what dose of quetiapine she is actually taking.  Also the insurance will not cover 4 loxapine capsules daily.  If she is taking 4 I will need to change it to 2 of the 50 mg capsules.  Please verify her current dose of loxapine and quetiapine.  I am weaning her off of quetiapine she should not be on that high of a dose of quetiapine. 11/23/2020:Pt called back stating she would like to stop Loxapine completely. She is having blurred vision and jittery. She did not pick up Loxapine Rx. She is currently taking quetiapine 100 mg (4-25 mg tabs daily). Requesting to go back to 600 mg daily. Requesting Rx for this.  Agreed 12/01/2020:Kristyna called to request that you decrease her gabapentin and prescribe Buspar.  Please call to  discuss this.  appt 01/11/21.  MD response: Took buspar years ago and wants to try it  again and reduce gabapentin.  Currently on 1200 mg HS. Reduce to 900 mg gabapentin.   Start buspirone 5 mg twice daily, and increase to 15 mg BID. Disc SE. She agrees.  01/11/2021 appointment with the following noted: Completely off gabapentin for a couple of weeks maybe.  Initially anxiety a little high in AM.  Increased buspirone. Had increase anxiety when off Seroquel.  Was off about 4 days and memory was not better.  Anxiety is better now. Anxiety is a little higher off gabapentin but not unmanageable.  Sleep is not as good off the gabapentin.  More initial insomnia and takes melatonin to manage. Not marked difference with cognition off gabapentin. Read about antipsychotics reducing brain volume and still wants to stop Seroquel despite multiple failed attempts. Wants to switch from Seroquel to trazodone.  Plan: OK trial Seroquel reduction to 400 mg daily with no other med changes.  02/17/2021 appointment with the following noted: Stopped buspirone without change. Stopped gabapentin. Reduced Seroquel to 400 mg HS. She is convinced antipsychotic is causing brain loss.  She wants to be off antipsychotics bc of this concern and risk of dementia.  Don't think fears of dementia are unfounded. Doesn't believe she has psychotic sx. Wants to retry lithium and then trazodone in place of Seroquel.  Took lithium a long time.   Acknowledges prior failures weaning Seroquel multiple times but thinks maybe it was too fast.  Sleep most of the night and sleep less deep than 600 mg Seroquel.  Also less deep without gabapentin.  Altogether 6-7 hours but leaves the TV on.   In bed 8 watching TV and doesn't try to sleep until 1030.  When manic feels more reckless, spending, talk to herself, driven, reduced sleep, irritable and angry.  Past Psychiatric Medication Trials: Vraylar 3 SE, Abilify, brief Latuda ? effect,   Depakote NR, carbamazepine questionable side effects, lamotrigine lost response, Trileptal,  lithium no response, clozapine, olanzapine mirtazapine, Seroquel 600, risperidone 1 mg twice daily  , Saphris CO anxiety several SSRIs, clomipramine had vision side effects,  gabapentin, failed an attempt to switch from Seroquel to Vraylar early 2020  Failed switch from Seroquel to Jordan, Saphris, Vraylar and loxapine No psych hosp.  Review of Systems:  Review of Systems  Cardiovascular: Negative for chest pain and palpitations.  Neurological: Negative for dizziness, tremors, weakness and light-headedness.  Psychiatric/Behavioral: Positive for decreased concentration. Negative for agitation, behavioral problems, confusion, dysphoric mood, hallucinations, self-injury, sleep disturbance and suicidal ideas. The patient is nervous/anxious. The patient is not hyperactive.     Medications: I have reviewed the patient's current medications.  Current Outpatient Medications  Medication Sig Dispense Refill  . atenolol (TENORMIN) 50 MG tablet Take 50 mg by mouth daily.    . B Complex Vitamins (VITAMIN B-COMPLEX) TABS Take by mouth.    . Cholecalciferol (VITAMIN D3) 5000 units CAPS Take 1 capsule by mouth daily.    Marland Kitchen levothyroxine (SYNTHROID, LEVOTHROID) 112 MCG tablet Take 112 mcg by mouth daily before breakfast.    . linaclotide (LINZESS) 290 MCG CAPS capsule TAKE 1 CAPSULE DAILY    . lithium carbonate 300 MG capsule 1 at night for 1 week, then 2 at night 180 capsule 0  . LORazepam (ATIVAN) 0.5 MG tablet Take 1-2 tablets (0.5-1 mg total) by mouth every 8 (eight) hours as needed for anxiety. 50 tablet 0  . memantine (NAMENDA) 10 MG tablet TAKE 1 TABLET TWICE A DAY 180 tablet 0  . mirtazapine (REMERON) 30  MG tablet TAKE 1 TABLET AT BEDTIME (Patient taking differently: Take 15 mg by mouth at bedtime.) 90 tablet 1  . temazepam (RESTORIL) 15 MG capsule at bedtime as needed.    . traZODone (DESYREL) 100 MG  tablet Take 1 tablet (100 mg total) by mouth at bedtime. 90 tablet 0  . zaleplon (SONATA) 10 MG capsule Take 1 capsule (10 mg total) by mouth at bedtime as needed for sleep. 90 capsule 0  . Acetylcysteine (NAC) 600 MG CAPS Take 1 capsule (600 mg total) by mouth daily. (Patient not taking: Reported on 02/17/2021) 90 capsule 1  . QUEtiapine (SEROQUEL) 300 MG tablet Take 1 tablet (300 mg total) by mouth at bedtime. 30 tablet 1  . rOPINIRole (REQUIP) 0.5 MG tablet TAKE 2 HOURS BEFORE RESTLESS STARTS: 1/2 TABLET FOR 1 NIGHT, THEN 1 TABLET FOR 4 NIGHTS, THEN 2 TABLET EVERY EVENING PRNQ (Patient not taking: No sig reported) 168 tablet 0   No current facility-administered medications for this visit.    Medication Side Effects: None  Allergies: No Known Allergies  History reviewed. No pertinent past medical history.  History reviewed. No pertinent family history.  Social History   Socioeconomic History  . Marital status: Divorced    Spouse name: Not on file  . Number of children: Not on file  . Years of education: Not on file  . Highest education level: Not on file  Occupational History  . Not on file  Tobacco Use  . Smoking status: Former Smoker    Quit date: 2000    Years since quitting: 22.2  . Smokeless tobacco: Never Used  Substance and Sexual Activity  . Alcohol use: Not on file  . Drug use: Not on file  . Sexual activity: Not on file  Other Topics Concern  . Not on file  Social History Narrative  . Not on file   Social Determinants of Health   Financial Resource Strain: Not on file  Food Insecurity: Not on file  Transportation Needs: Not on file  Physical Activity: Not on file  Stress: Not on file  Social Connections: Not on file  Intimate Partner Violence: Not on file    Past Medical History, Surgical history, Social history, and Family history were reviewed and updated as appropriate.   Please see review of systems for further details on the patient's review from  today.   Objective:   Physical Exam:  There were no vitals taken for this visit.  Physical Exam Neurological:     Mental Status: She is alert and oriented to person, place, and time.     Cranial Nerves: No dysarthria.  Psychiatric:        Attention and Perception: Attention normal.        Mood and Affect: Mood is not anxious or depressed.        Speech: Speech normal. Speech is not rapid and pressured.        Behavior: Behavior is cooperative.        Thought Content: Thought content normal. Thought content is not paranoid or delusional. Thought content does not include homicidal or suicidal ideation. Thought content does not include homicidal or suicidal plan.        Cognition and Memory: She exhibits impaired recent memory. She does not exhibit impaired remote memory.        Judgment: Judgment normal.     Comments: Insight and judgment appear good. Denies mania symptoms or severe depression irritable.  She is also noticing  less mood cycling on SERoquel.  Anxiety is better at moment.     Lab Review:  No results found for: NA, K, CL, CO2, GLUCOSE, BUN, CREATININE, CALCIUM, PROT, ALBUMIN, AST, ALT, ALKPHOS, BILITOT, GFRNONAA, GFRAA  No results found for: WBC, RBC, HGB, HCT, PLT, MCV, MCH, MCHC, RDW, LYMPHSABS, MONOABS, EOSABS, BASOSABS  No results found for: POCLITH, LITHIUM   No results found for: PHENYTOIN, PHENOBARB, VALPROATE, CBMZ   Took lab orders to PCP from visit in September and reports results: B12 >2000 Folate > 20 D 43.5 Iron TIBC 297 (250-450)  UIBC 156 (131-425)  Iron 141 (27-159)  Iron saturation 47 (15-55)   .res Assessment: Plan:    Bipolar 1 disorder, mixed, moderate (HCC) - Plan: lithium carbonate 300 MG capsule, Basic metabolic panel, Lithium level, TSH, QUEtiapine (SEROQUEL) 300 MG tablet  Generalized anxiety disorder  Mixed obsessional thoughts and acts  Agoraphobia  MCI (mild cognitive impairment)  Insomnia due to mental condition - Plan:  traZODone (DESYREL) 100 MG tablet  Low vitamin D level  Lithium use - Plan: lithium carbonate 300 MG capsule, Basic metabolic panel, Lithium level, TSH   Greater than 50% of 30-minute video time with patient was spent on counseling and coordination of care. We discussed nature of her psychiatric diagnoses .   She remains motivated to get off Seroquel because she believes is causing cognitive problems. Bipolar disorder Was worse with reduction in Seroquel Failed switch from Seroquel to Jordan, Saphris, Vraylar and loxapine   Wants to switch from Seroquel to trazodone. Again reviewed she's failed all mood stabilizers .  Explained there are no mood stabilizers left that are not also antipsychotics.  She then wanted to consider retrying lithium.  Chart says it failed but she says trial was 20 years ago. Counseled patient regarding potential benefits, risks, and side effects of lithium to include potential risk of lithium affecting thyroid and renal function.  Discussed need for periodic lab monitoring to determine drug level and to assess for potential adverse effects.  Counseled patient regarding signs and symptoms of lithium toxicity and advised that they notify office immediately or seek urgent medical attention if experiencing these signs and symptoms.  Patient advised to contact office with any questions or concerns.  Discussed her questions about progression of underlying bipolar disorder and possible neuro degenerative changes.  She is read that antipsychotics reduce brain volume.  This is controversial because the conditions that require antipsychotics themselves such as schizophrenia and bipolar disorder themselves reduce brain volume so there is debate on that subject.  Furthermore there appears to be no other option for mood stabilization in her case besides an antipsychotic.  So instead we will attempt to reduce the Seroquel to the lowest effective dose.  She  failed attempts to get off  Seroquel multiple times as noted above.  OK trial Seroquel reduction to 300 mg daily for 3-4 weeks, then 200 mg for 3-4 weeks. Disc withdrawal effects from Seroquel. Start lithium 300 mg nightly for a week then 600 mg nightly. Wait a week and get blood level. Ok start trazodone 50-100 mg HS Disc risk mood cycling risk with trazodone but less dependence risk than BZ  Sleep hygiene incl no bed without sleep.  OK prn lorazepam.  Caution re driving and drowsiness.  We discussed the short-term risks associated with benzodiazepines including sedation and increased fall risk among others.  Discussed long-term side effect risk including dependence, potential withdrawal symptoms, and the potential eventual dose-related risk of  dementia.  Discussed potential metabolic side effects associated with atypical antipsychotics, as well as potential risk for movement side effects. Advised pt to contact office if movement side effects occur.   Disc cognitive risks at length with meds and risks of untreated bipolar disorder for cognition as well.  She is taking Namenda 10 mg twice daily off label for help with this symptom. Disc the off-label use of N-Acetylcysteine at 600 mg daily to help with mild cognitive problems.  It can be combined with a B-complex vitamin as the B-12 and folate have been shown to sometimes enhance the effect.  Disc availability problems with NAC.  She took it in the past.  Thyroid was normal recently. B12 and folate are normal D is OK but goal with her should be 50s-60s Disc reasons in detail including Dr. Charm Barges recommendation. .  Disc neuroprotective literature on lithium. Dr. Arlie Solomons article  Greater than 50% of 30-minutevideo time with patient was spent on counseling and coordination of care.   FU 6 weeks  Meredith Staggers, MD, DFAPA   Please see After Visit Summary for patient specific instructions.  No future appointments.  Orders Placed This Encounter  Procedures  . Basic  metabolic panel  . Lithium level  . TSH      -------------------------------

## 2021-03-07 ENCOUNTER — Telehealth: Payer: Self-pay | Admitting: Psychiatry

## 2021-03-07 ENCOUNTER — Other Ambulatory Visit: Payer: Self-pay

## 2021-03-07 MED ORDER — MEMANTINE HCL 10 MG PO TABS: 10.0000 mg | ORAL_TABLET | Freq: Two times a day (BID) | ORAL | 0 refills | Status: DC

## 2021-03-07 NOTE — Telephone Encounter (Signed)
Rx sent 

## 2021-03-07 NOTE — Telephone Encounter (Signed)
Kristen Leon called to request refill of her Namenda.   [facility] indicated her prescription had expired and they wouldn't send a request for the refill.  Appt 5/11.  Send 90 day supply to [facility] mail order.

## 2021-03-22 ENCOUNTER — Ambulatory Visit: Payer: Federal, State, Local not specified - PPO | Admitting: Psychiatry

## 2021-03-27 ENCOUNTER — Telehealth: Payer: Self-pay | Admitting: Psychiatry

## 2021-03-27 NOTE — Telephone Encounter (Signed)
Pt informed

## 2021-03-27 NOTE — Telephone Encounter (Signed)
Pt would like to know the max amount of Trazodone that she can take in order to help her sleep. Please Call [phone]

## 2021-03-27 NOTE — Telephone Encounter (Signed)
She can take up to 200 mg trazodone at night for sleep.  I think she is trying to wean off of the Seroquel and tell her she may have to go slower than she thinks or then we have discussed in order to keep from having insomnia as she weans off the Seroquel.

## 2021-03-27 NOTE — Telephone Encounter (Signed)
Please review

## 2021-04-12 ENCOUNTER — Telehealth (INDEPENDENT_AMBULATORY_CARE_PROVIDER_SITE_OTHER): Payer: Federal, State, Local not specified - PPO | Admitting: Psychiatry

## 2021-04-12 ENCOUNTER — Encounter: Payer: Self-pay | Admitting: Psychiatry

## 2021-04-12 DIAGNOSIS — F422 Mixed obsessional thoughts and acts: Secondary | ICD-10-CM | POA: Diagnosis not present

## 2021-04-12 DIAGNOSIS — F3162 Bipolar disorder, current episode mixed, moderate: Secondary | ICD-10-CM | POA: Diagnosis not present

## 2021-04-12 DIAGNOSIS — F4000 Agoraphobia, unspecified: Secondary | ICD-10-CM | POA: Diagnosis not present

## 2021-04-12 DIAGNOSIS — G3184 Mild cognitive impairment, so stated: Secondary | ICD-10-CM

## 2021-04-12 DIAGNOSIS — F5105 Insomnia due to other mental disorder: Secondary | ICD-10-CM

## 2021-04-12 DIAGNOSIS — F411 Generalized anxiety disorder: Secondary | ICD-10-CM

## 2021-04-12 DIAGNOSIS — Z79899 Other long term (current) drug therapy: Secondary | ICD-10-CM

## 2021-04-12 LAB — BASIC METABOLIC PANEL
BUN/Creatinine Ratio: 14 (ref 9–23)
BUN: 13 mg/dL (ref 6–24)
CO2: 25 mmol/L (ref 20–29)
Calcium: 9.7 mg/dL (ref 8.7–10.2)
Chloride: 97 mmol/L (ref 96–106)
Creatinine, Ser: 0.94 mg/dL (ref 0.57–1.00)
Glucose: 77 mg/dL (ref 65–99)
Potassium: 4.3 mmol/L (ref 3.5–5.2)
Sodium: 136 mmol/L (ref 134–144)
eGFR: 73 mL/min/{1.73_m2} (ref >59)

## 2021-04-12 LAB — TSH: TSH: 3.96 u[IU]/mL (ref 0.450–4.500)

## 2021-04-12 LAB — LITHIUM LEVEL: Lithium Lvl: 0.8 mmol/L (ref 0.5–1.2)

## 2021-04-12 MED ORDER — QUETIAPINE FUMARATE 200 MG PO TABS: 200.0000 mg | ORAL_TABLET | Freq: Every day | ORAL | 0 refills | Status: DC

## 2021-04-12 MED ORDER — DOXEPIN HCL 10 MG PO CAPS: 10.0000 mg | ORAL_CAPSULE | Freq: Every evening | ORAL | 0 refills | Status: DC | PRN

## 2021-04-12 NOTE — Progress Notes (Signed)
Kristen Leon [id number] 1966/04/28 55 y.o.  Video Visit via My Chart  I connected with pt by My Chart and verified that I am speaking with the correct person using two identifiers.   I discussed the limitations, risks, security and privacy concerns of performing an evaluation and management service by My Chart  and the availability of in person appointments. I also discussed with the patient that there may be a patient responsible charge related to this service. The patient expressed understanding and agreed to proceed.  I discussed the assessment and treatment plan with the patient. The patient was provided an opportunity to ask questions and all were answered. The patient agreed with the plan and demonstrated an understanding of the instructions.   The patient was advised to call back or seek an in-person evaluation if the symptoms worsen or if the condition fails to improve as anticipated.  I provided 30 minutes of video time during this encounter.  The patient was located at home and the provider was located office. Session started 2:45 PM and ended 3:15 PM  Subjective:   Patient ID:  Kristen Leon is a 55 y.o. (DOB 09/09/1966) female.  Chief Complaint:  Chief Complaint  Patient presents with  . Follow-up  . Bipolar 1 disorder, mixed, moderate (HCC)  . Medication Problem  . Sleeping Problem    Medication Refill Pertinent negatives include no chest pain or weakness.     Kristen Leon presents  today for follow-up ofBipolar disorder, generalized anxiety disorder, nocturnal panic attacks, history of OCD, and mild cognitive impairment and worsening driving anxiety.  She has a goal of getting off the Seroquel in hopes of improving memory.  at visit August 06, 2019.  We needed to start a mood stabilizer and discussed variety of options and decided to start with Latuda.  We started at 20 mg and the plan was to increase gradually to 80 mg daily.  If that was successful this follow-up  was going to lead to a potential reduction in an attempt to wean Seroquel. Never got up to 80 mg Latuda.    Did not noticed much of a difference.  visit September 03, 2019.  The following changes were made: Increase vitamin D from 5000 units daily to 10K units daily Start Latuda  80 mg daily Start reduction in Seroquel next week and reduce once every 3 weeks. Reduced Seroquel from 600 to 200 mg HS.  At first had sleep and mood problems and angry.  Added in  gabapentin 300 mg total week ago and sleep better.  Able to sleep on her own.  Mood is better also.  visit October 12, 2019.  The following changes were made: Increase vitamin D from 5000 units daily to 10K units daily Continue Latuda  80 mg daily Continue gradual reduction in Seroquel next week and reduce once every 3 to 4 weeks by 50 mg. Trying to get rid of gabapentin and Seroquel for cognitive reasons primarily  seen December 16, 2019.  The following was noted: Down to Seroquel 150 HS for 3 weeks.  Quality of sleepy slightly off.  How long on this dose?  Tends to wake 30 min earlier than usual.  Bought magnesium.   Feels fine in the day.  Slightly less harried and mentally cloudy with reduction in Seroquel. Likes the [place] as mood medication.   Overall mood and anxiety are under control.  Other concern is focus and memory. Wants to get off gabapentin and Seroquel for that  reason.  Feels the meds may increase risk of dementia.  Overall she has seen some cognitive improvement since the reduction in Seroquel as noted. She was in courage to try reducing the Seroquel further gradually.  She was encouraged to leave the gabapentin at 300 mg daily because it was helpful.  Last seen March 14, 2020.  She was not doing well.  The following was noted: Couldn't sleep without Seroquel 200 mg.  Felt unstable and anxious.  For weeks felt she still struggled at 200 mg Seroquel and raised the dose all the way back up to where she started bc felt desperate  and was on edge.   Early 2000's not on Seroquel and had a lot of anxiety and obsessive thought.  It helps anxiety. Mood better on Seroquel and feels better physically and mentally.  A little down with stress and failure of switch to Jordan.  little anger but not much depression and no other sx of mania.    Driving anxiety and sense unreality resolved.  Fine with driving now. The following changes were made: Continue vitamin D from 5000 units daily to 10K units daily Wean Latuda to 40 mg for a week and then stop it. Then start Saphris  HS and reduce Seroquel 400 mg at night for 5 days, then increase Saphris 10 mg at night and reduce Seroquel to 200 mg at night for 5 days, then increase Saphris to 15 HS and stop sEroquel.  04/12/2020 appointment the following is noted: As noted pt has been wanting off Seroquel so med changes last visit wre intended to help. More anxious with reduced gabapentin and Seroquel.  Hard to stop Seroquel bc both anxiety and insomnia and using 100 mg Seroquel.  Missed a day of work over it yesterday.  More sleep with Saphris than Latuda.  No SE with saphris. Anxiety included fidgety. Hasn't tried Saphris 15 mg daily yet.   Has felt in a little less foggy headed with reduced Seroquel and gabapentin.  Down on gabapentin for a while now and only a little change with that reduction.  A little better focus and memory now. Neuropsych testing next week. Plan: Increase Saphris 15 mg HS and stop Seroquel again.  Can reduce Seroquel to 50 or even lower if needed.    05/09/2020 phone call with the following noted: Patient had to go back up to 600 mg Seroquel at hs, she has tried the last few nights at 500 mg. She's been unsuccessful tapering down and just wanted to have 200 mg and 100 mg tablets available to try and get it lowered.   07/07/2020 appointment with the following noted: Got close to off Seroquel but felt untethered and trouble with sleep initial and terminal. 5-6 hours on low  dose Seroquel. Never tried Saphris 15 mg for reasons that are unclear.  Stopped shortly after the phone session from last time. Started Seroquel by 2002 and gabapentin in 2003.  Finds it very difficult to get off the meds and worry over instability. Plan: Start Saphris 10 mg at night and reduce Seroquel to 400 mg nightly for 4 nights, Then increase Saphris to 15 mg at night and reduce Seroquel to 200 mg for 4 nights, Then reduce Seroquel to 100 mg and continue Saphris at 15 mg nightly for 4 nights, Then stop Seroquel and if insomnia occurs, increase Saphris to 20 mg at night.  07/19/2020 phone call.  Restless legs on Saphris.  Given instructions to change to loxapine 100  mg nightly. 07/25/2020 phone call patient stating she wanted to try to continue Saphris but have treatment for the restless legs side effects. Prescription sent for ropinirole  09/13/2020 appointment with the following noted: Not taking Saphris.  At 10 mg HS CO jitteriness and anxiety the next day. Never took loxapine. Wonders about taking loxapine now.  Plan: Give her this change in instructions for transition from Seroquel to loxapine: Start loxapine 25 mg capsule 1 at night and reduce Seroquel to 400 mg nightly for 4 nights, Then increase loxapine to 2 capsules at night and reduce Seroquel to 200 mg for 4 nights, Then reduce Seroquel to 100 mg and increase loxapine to 3 capsules nightly for 4 nights, Then stop Seroquel and if insomnia occurs, increase loxapine to 4 capsules at night and notify MD.   Multiple phone calls since the last visit including the following. 10/24/2020:Telephone call to patient.  She experiences an increase in anxiety whenever she reduces the Seroquel.  She is just started the transition from Seroquel to loxapine.  She is tolerating the loxapine well.  Encouraged her to just use the lorazepam and transition from Seroquel to loxapine.  It is hoped and expected that the loxapine will effectively manage  anxiety as the Seroquel when she is on the full dose.  She agrees to the plan.  Lorazepam prescription sent in. 11/17/2020: RTC See prior note.  Took 50 mg Seroquel last night.  Realized she made a mistake in dosing.  She stopped from 200 mg Seroquel instead of reducing to 100 mg as instructed.  Had gotten up to 4 loxapine without seroquel was jittery and like she'd come out of her skin and took lorazepam.  Still some problems with memory.  Last night took 50 mg Seroquel and was a little better. Much better today than yesterday.   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   Memory testing with visuospatial problems and was worked up over that.   Take 4 loxapine and start 75 mg Seroquel and taper from there more slowly. Appt 11/22/20  11/22/2020:Pt requesting Rx for Loxapine 25 mg 4/d  30 day and Seroquel 200 mg 3/d   30 day and Lorazepam 0.5 mg 1-2 tab every 8 hrs PRN for anxiety  @ [facility] [employer] on file. Apt RS from 12/21 to 2/9 and on canc list. Pt going out of town 12/27-1/3 asking for Rx before 12/27.  11/22/2020 MD response: I sent in the prescription for lorazepam.  Patient is not on quetiapine 200 mg 3 daily and I will not prescribe that dose.  Ask her what dose of quetiapine she is actually taking.  Also the insurance will not cover 4 loxapine capsules daily.  If she is taking 4 I will need to change it to 2 of the 50 mg capsules.  Please verify her current dose of loxapine and quetiapine.  I am weaning her off of quetiapine she should not be on that high of a dose of quetiapine. 11/23/2020:Pt called back stating she would like to stop Loxapine completely. She is having blurred vision and jittery. She did not pick up Loxapine Rx. She is currently taking quetiapine 100 mg (4-25 mg tabs daily). Requesting to go back to 600 mg daily. Requesting Rx for this.  Agreed 12/01/2020:Harjot called to request that you decrease her gabapentin and  prescribe Buspar.  Please call to discuss this.  appt 01/11/21.  MD response: Took buspar years  ago and wants to try it again and reduce gabapentin.  Currently on 1200 mg HS. Reduce to 900 mg gabapentin.   Start buspirone 5 mg twice daily, and increase to 15 mg BID. Disc SE. She agrees.  01/11/2021 appointment with the following noted: Completely off gabapentin for a couple of weeks maybe.  Initially anxiety a little high in AM.  Increased buspirone. Had increase anxiety when off Seroquel.  Was off about 4 days and memory was not better.  Anxiety is better now. Anxiety is a little higher off gabapentin but not unmanageable.  Sleep is not as good off the gabapentin.  More initial insomnia and takes melatonin to manage. Not marked difference with cognition off gabapentin. Read about antipsychotics reducing brain volume and still wants to stop Seroquel despite multiple failed attempts. Wants to switch from Seroquel to trazodone.  Plan: OK trial Seroquel reduction to 400 mg daily with no other med changes.  02/17/2021 appointment with the following noted: Stopped buspirone without change. Stopped gabapentin. Reduced Seroquel to 400 mg HS. She is convinced antipsychotic is causing brain loss.  She wants to be off antipsychotics bc of this concern and risk of dementia.  Don't think fears of dementia are unfounded. Doesn't believe she has psychotic sx. Wants to retry lithium and then trazodone in place of Seroquel.  Took lithium a long time.   Acknowledges prior failures weaning Seroquel multiple times but thinks maybe it was too fast.  Sleep most of the night and sleep less deep than 600 mg Seroquel.  Also less deep without gabapentin.  Altogether 6-7 hours but leaves the TV on.   In bed 8 watching TV and doesn't try to sleep until 1030. Plan: She  failed attempts to get off Seroquel multiple times. OK trial Seroquel reduction to 300 mg daily for 3-4 weeks, then 200 mg for 3-4 weeks. Disc  withdrawal effects from Seroquel. Start lithium 300 mg nightly for a week then 600 mg nightly. Wait a week and get blood level.    04/12/2021 appointment with the following noted: Did get down to Seroquel 200 mg HS and up to lithium 600 HS and trazodone 100 mg HS. Sleep more interrupted but 6-7 hours with poor quality and less well rested with change.  Mood really good with lithium and maybe better. Often had rapid cycling and it seemed to stop. But "sleep sucks" and feels it affects her during the day Still takes mirtazapine 15 mg and added trazodone.  Sleep worse without trazodone. No SE with lithium. Cognition  more clear with better focus with less Seroquel.   Patient reports stable mood and denies depressed or irritable moods.  Patient denies any recent difficulty with anxiety, surprisingly. Denies appetite disturbance.  Patient reports that energy and motivation have been good.  Patient denies any difficulty with concentration.  Patient denies any suicidal ideation.    When manic feels more reckless, spending, talk to herself, driven, reduced sleep, irritable and angry.  Past Psychiatric Medication Trials: Vraylar 3 SE, Abilify, brief Latuda ? effect,  Depakote NR, carbamazepine questionable side effects, lamotrigine lost response, Trileptal,  lithium no response, clozapine, olanzapine , Seroquel 600, risperidone 1 mg twice daily  , Saphris CO anxiety mirtazapine,  Gabapentin cog SE,  Trazodone 200 several SSRIs, clomipramine had vision side effects,   failed an attempt to switch from Seroquel to Vraylar early 2020  Failed switch from Seroquel to [place], Saphris, Vraylar and loxapine No psych hosp.  Review of Systems:  Review of Systems  Cardiovascular: Negative for chest pain and palpitations.  Neurological: Negative for dizziness, tremors, weakness and light-headedness.  Psychiatric/Behavioral: Positive for decreased concentration and sleep disturbance. Negative for  agitation, behavioral problems, confusion, dysphoric mood, hallucinations, self-injury and suicidal ideas. The patient is not nervous/anxious and is not hyperactive.     Medications: I have reviewed the patient's current medications.  Current Outpatient Medications  Medication Sig Dispense Refill  . Acetylcysteine (NAC) 600 MG CAPS Take 1 capsule (600 mg total) by mouth daily. 90 capsule 1  . atenolol (TENORMIN) 50 MG tablet Take 50 mg by mouth daily.    . B Complex Vitamins (VITAMIN B-COMPLEX) TABS Take by mouth.    . Cholecalciferol (VITAMIN D3) 5000 units CAPS Take 1 capsule by mouth daily.    Marland Kitchen doxepin (SINEQUAN) 10 MG capsule Take 1-3 capsules (10-30 mg total) by mouth at bedtime as needed. 60 capsule 0  . levothyroxine (SYNTHROID, LEVOTHROID) 112 MCG tablet Take 112 mcg by mouth daily before breakfast.    . linaclotide (LINZESS) 290 MCG CAPS capsule TAKE 1 CAPSULE DAILY    . lithium carbonate 300 MG capsule 1 at night for 1 week, then 2 at night 180 capsule 0  . LORazepam (ATIVAN) 0.5 MG tablet Take 1-2 tablets (0.5-1 mg total) by mouth every 8 (eight) hours as needed for anxiety. 50 tablet 0  . memantine (NAMENDA) 10 MG tablet Take 1 tablet (10 mg total) by mouth 2 (two) times daily. 180 tablet 0  . temazepam (RESTORIL) 15 MG capsule at bedtime as needed.    . zaleplon (SONATA) 10 MG capsule Take 1 capsule (10 mg total) by mouth at bedtime as needed for sleep. 90 capsule 0  . QUEtiapine (SEROQUEL) 200 MG tablet Take 1 tablet (200 mg total) by mouth at bedtime. 90 tablet 0   No current facility-administered medications for this visit.    Medication Side Effects: None  Allergies: No Known Allergies  History reviewed. No pertinent past medical history.  History reviewed. No pertinent family history.  Social History   Socioeconomic History  . Marital status: Divorced    Spouse name: Not on file  . Number of children: Not on file  . Years of education: Not on file  . Highest  education level: Not on file  Occupational History  . Not on file  Tobacco Use  . Smoking status: Former Smoker    Quit date: 2000    Years since quitting: 22.3  . Smokeless tobacco: Never Used  Substance and Sexual Activity  . Alcohol use: Not on file  . Drug use: Not on file  . Sexual activity: Not on file  Other Topics Concern  . Not on file  Social History Narrative  . Not on file   Social Determinants of Health   Financial Resource Strain: Not on file  Food Insecurity: Not on file  Transportation Needs: Not on file  Physical Activity: Not on file  Stress: Not on file  Social Connections: Not on file  Intimate Partner Violence: Not on file    Past Medical History, Surgical history, Social history, and Family history were reviewed and updated as appropriate.   Please see review of systems for further details on the patient's review from today.   Objective:   Physical Exam:  There were no vitals taken for this visit.  Physical Exam Neurological:     Mental Status: She is alert and oriented to person, place, and time.  Cranial Nerves: No dysarthria.  Psychiatric:        Attention and Perception: Attention normal.        Mood and Affect: Mood is not anxious or depressed.        Speech: Speech normal. Speech is not rapid and pressured.        Behavior: Behavior is cooperative.        Thought Content: Thought content normal. Thought content is not paranoid or delusional. Thought content does not include homicidal or suicidal ideation. Thought content does not include homicidal or suicidal plan.        Cognition and Memory: She exhibits impaired recent memory. She does not exhibit impaired remote memory.        Judgment: Judgment normal.     Comments: Insight and judgment appear good. Denies mania symptoms or severe depression irritable.  Anxiety is better at moment.     Lab Review:     Component Value Date/Time   NA 136 04/10/2021 1035   K 4.3 04/10/2021  1035   CL 97 04/10/2021 1035   CO2 25 04/10/2021 1035   GLUCOSE 77 04/10/2021 1035   BUN 13 04/10/2021 1035   CREATININE 0.94 04/10/2021 1035   CALCIUM 9.7 04/10/2021 1035    No results found for: WBC, RBC, HGB, HCT, PLT, MCV, MCH, MCHC, RDW, LYMPHSABS, MONOABS, EOSABS, BASOSABS  Lithium Lvl  Date Value Ref Range Status  04/10/2021 0.8 0.5 - 1.2 mmol/L Final    Comment:    Plasma concentration of 0.5 - 0.8 mmol/L are advised for long-term use; concentrations of up to 1.2 mmol/L may be necessary during acute treatment.                                  Detection Limit = 0.1                           <0.1 indicates None Detected      No results found for: PHENYTOIN, PHENOBARB, VALPROATE, CBMZ   Took lab orders to PCP from visit in September and reports results: B12 >2000 Folate > 20 D 43.5 Iron TIBC 297 (250-450)  UIBC 156 (131-425)  Iron 141 (27-159)  Iron saturation 47 (15-55)   .res Assessment: Plan:    Bipolar 1 disorder, mixed, moderate (HCC) - Plan: QUEtiapine (SEROQUEL) 200 MG tablet, Lithium level  Generalized anxiety disorder  Mixed obsessional thoughts and acts  Agoraphobia  MCI (mild cognitive impairment)  Insomnia due to mental condition  Lithium use - Plan: Lithium level   Greater than 50% of 30-minute video time with patient was spent on counseling and coordination of care. We discussed nature of her psychiatric diagnoses .   She remains motivated to get off Seroquel because she believes is causing cognitive problems. Bipolar disorder Was worse with reduction in Seroquel until addtion of lithium has helped so far. Failed switch from Seroquel to [place], Saphris, Vraylar and loxapine   She then wanted to consider retrying lithium.  Chart says it failed but she says trial was 20 years ago. Counseled patient regarding potential benefits, risks, and side effects of lithium to include potential risk of lithium affecting thyroid and renal function.   Discussed need for periodic lab monitoring to determine drug level and to assess for potential adverse effects.  Counseled patient regarding signs and symptoms of lithium toxicity and advised that they  notify office immediately or seek urgent medical attention if experiencing these signs and symptoms.  Patient advised to contact office with any questions or concerns.  Discussed her questions about progression of underlying bipolar disorder and possible neuro degenerative changes.  She is read that antipsychotics reduce brain volume.  This is controversial because the conditions that require antipsychotics themselves such as schizophrenia and bipolar disorder themselves reduce brain volume so there is debate on that subject.  Furthermore there appears to be no other option for mood stabilization in her case besides an antipsychotic.  So instead we will attempt to reduce the Seroquel to the lowest effective dose.  She  failed attempts to get off Seroquel multiple times as noted above.  Continue Seroquel  200 mg for 3-4 weeks.  Continue lithium 600 mg nightly. Check another level before next appt  Sleep hygiene incl no bed without sleep.  Sleep options Belsomra, Dayvigo, doxepin, Rozerem, hydroxyzine.  Prefer avoid BZ and bc cognitive concerns she has.  Stop mirtazapine and trazodone Doxepin 10-30 mg HS   OK prn lorazepam.  Caution re driving and drowsiness.  We discussed the short-term risks associated with benzodiazepines including sedation and increased fall risk among others.  Discussed long-term side effect risk including dependence, potential withdrawal symptoms, and the potential eventual dose-related risk of dementia.  Discussed potential metabolic side effects associated with atypical antipsychotics, as well as potential risk for movement side effects. Advised pt to contact office if movement side effects occur.   Disc cognitive risks at length with meds and risks of untreated bipolar  disorder for cognition as well.  She is taking Namenda 10 mg twice daily off label for help with this symptom. Disc the off-label use of N-Acetylcysteine at 600 mg daily to help with mild cognitive problems.  It can be combined with a B-complex vitamin as the B-12 and folate have been shown to sometimes enhance the effect.  Disc availability problems with NAC.  She took it in the past.  Thyroid was normal recently. B12 and folate are normal D is OK but goal with her should be 50s-60s Disc reasons in detail including Dr. Charm BargesLapp's recommendation. .  Disc neuroprotective literature on lithium. Dr. Arlie SolomonsPost article  Greater than 50% of 30-minutevideo time with patient was spent on counseling and coordination of care.   FU 6 weeks  Meredith Staggersarey Cottle, MD, DFAPA   Please see After Visit Summary for patient specific instructions.  No future appointments.  Orders Placed This Encounter  Procedures  . Lithium level      -------------------------------

## 2021-04-17 ENCOUNTER — Other Ambulatory Visit: Payer: Self-pay | Admitting: Psychiatry

## 2021-04-18 NOTE — Progress Notes (Signed)
Lithium level 0.8 on 600 mg daily with normal BMP and TSH.  No change indicated.

## 2021-04-24 ENCOUNTER — Telehealth: Payer: Self-pay | Admitting: Psychiatry

## 2021-04-24 NOTE — Telephone Encounter (Signed)
Her case is very complicated because she has complained of side effects from atypicals and worried about cognitive side effects of benzodiazepines.  She is bipolar and therefore we cannot use SSRIs.  She does not want to take atypical antipsychotics.  She is on a low dose of Seroquel now and I am sure does not want to increase the dosage back up.  She has a as needed prescription for lorazepam she can use that.  I will not make any other major med changes over the phone outside of a scheduled appointment.  If the symptoms are bothersome enough however going to the cancellation list and we will attempt to get her into an appointment sooner.

## 2021-04-24 NOTE — Telephone Encounter (Signed)
Pt LM on VM asking for med for excessive thinking. Contact # [phone]. Apt 8/9

## 2021-04-24 NOTE — Telephone Encounter (Signed)
Pt reports she is having obsessive thinking and asking for something to be sent in to [facility]. Her phone was cutting in and out with the weather and she mentioned that she tried something in the past and had allergic reaction? Not sure I understood that correctly.  Informed her I would update Dr. Jennelle Human and follow up

## 2021-04-24 NOTE — Telephone Encounter (Signed)
Moved apt up to 6/30 and on canc list

## 2021-05-10 ENCOUNTER — Other Ambulatory Visit: Payer: Self-pay | Admitting: Psychiatry

## 2021-05-10 DIAGNOSIS — F3162 Bipolar disorder, current episode mixed, moderate: Secondary | ICD-10-CM

## 2021-05-10 DIAGNOSIS — Z79899 Other long term (current) drug therapy: Secondary | ICD-10-CM

## 2021-05-18 ENCOUNTER — Other Ambulatory Visit: Payer: Self-pay

## 2021-05-18 ENCOUNTER — Encounter (HOSPITAL_BASED_OUTPATIENT_CLINIC_OR_DEPARTMENT_OTHER): Payer: Self-pay | Admitting: Obstetrics and Gynecology

## 2021-05-18 NOTE — Progress Notes (Addendum)
ADDENDUM:  received pt's ekg from cardiology office via fax , placed in chart.   Spoke w/ via phone for pre-op interview--- Pt Lab needs dos----  CBC             Lab results------ pt had CMP done 04-25-2021 result in epic/  current ekg done at cardiology office , requested via fax COVID test -----patient states asymptomatic no test needed Arrive at -------  0530 on 05-24-2021 NPO after MN NO Solid Food.  Clear liquids from MN until--- 0430 Med rec completed Medications to take morning of surgery ----- none Diabetic medication ----- n/a Patient instructed no nail polish to be worn day of surgery Patient instructed to bring photo id and insurance card day of surgery Patient aware to have Driver (ride ) / caregiver  for 24 hours after surgery -- mother Barbara [place] Patient Special Instructions ----- n/a Pre-Op special Istructions ----- n/a Patient verbalized understanding of instructions that were given at this phone interview. Patient denies shortness of breath, chest pain, fever, cough at this phone interview.    Anesthesia :  ST/ palpitations ;  OSA does not use cpap;  CKD 2; NAFLD; bipolar;  pt denies cardiac s&s, sob, and no peripheral swelling.  PCP:  Dr Kathie Rhodes. Crowder Theron Arista 04-27-2021) Cardiologist :  Guerry Bruin NP ([place] 11-17-2020 care everywhere) Endocrinologist:  Dr C. Livia Snellen 11-04-2020 care everywhere) Neurologist:  Dr D. Meyer ([place] 12-13-2020) GI:  Dr Romualdo Bolk [facility] 04-07-2021) Psychiatrist:  Dr C. Cottle   Chest x-ray : chest CT 06-24-2019 care everywhre EKG :  11-17-2020 (requested from [facility] cardiology) Echo :  03/ 2014  per cardiologist note normal Stress test:  gated cardiolite ETT 01-12-2021 care everywhere  (normal no ischemia, ef 67%) Cardiac Cath :   no Activity level: denies sob w/ any activity Sleep Study/ CPAP : Yes/  NO Blood Thinner/ Instructions /Last Dose: NO ASA / Instructions/ Last Dose :  NO

## 2021-05-23 NOTE — H&P (Signed)
Kristen Leon is an 55 y.o. female G0 here for scheduled hysteroscopy myosure lite with fluid system 05/24/2021. for polyps and PMB. Pt elected to do in the hospital She reports no further VB for several weeks.  Pt had episode of PMB - Prior u/s: thickened endometrium with small amount of endometrial fluid. Endo thickness: 7.24 mm. EMB benign and stopped bleeding on her own. Did provera and had no further withdrawal bleed, states she stopped imvexxy    Pertinent Gynecological History:  OB History: none  Menstrual History:  No LMP recorded. Patient is postmenopausal.    Past Medical History:  Diagnosis Date   Anxiety    Bipolar 1 disorder (HCC)    followed by dr c. cottle   Carpal tunnel syndrome on both sides    CKD (chronic kidney disease), stage II    followed by pcp   Endometriosis    Hypothyroidism, postablative    endocrinologist--- dr c. Dewitt Hoes--- dx toxic multinodular thyroid 2000 w/ hyperthyroidism s/p RAI  2000, 2002, and 2004;  post hypothryoid in 2008;   last bx 2012 left side, benign per pt   Irritable bowel syndrome with constipation    followed by dr w. Jason Fila ([facility])   MDD (major depressive disorder)    Multinodular thyroid    in 2000 dx toxic post ablative   NAFLD (nonalcoholic fatty liver disease)    OSA (obstructive sleep apnea)    05-18-2021  per pt study done approx. 2018, told moderat OSA,  does uses cpap   Tachycardia    cardiologist--- Guerry Bruin NP @ [facility] in [place],  w/ palpitations, controlled w/ atenolol;  pt has had work-up per note echo 2014 normal ,  event monitor 2018 showedST w/ mild ST no arrhythmia,  ETT 02/ 2022 normal no ischemia, ef 67%   Wears contact lenses     Past Surgical History:  Procedure Laterality Date   COLONOSCOPY  2019   DIAGNOSTIC LAPAROSCOPY  1995   w/ fulgeration endometriosis   ESOPHAGOGASTRODUODENOSCOPY  2008   FOOT SURGERY Bilateral 2016   approx---  bilateral 5th toe , removal bone    History reviewed. No pertinent  family history.  Social History:  reports that she quit smoking about 22 years ago. Her smoking use included cigarettes. She has never used smokeless tobacco. She reports current alcohol use of about 1.0 standard drink of alcohol per week. She reports that she does not use drugs.  Allergies: No Known Allergies  No medications prior to admission.    Review of Systems  Constitutional:  Negative for fever.  Gastrointestinal:  Negative for abdominal pain.   Height 5\' 6"  (1.676 m), weight 85.3 kg. Physical Exam Constitutional:      Appearance: Normal appearance.  Cardiovascular:     Rate and Rhythm: Normal rate and regular rhythm.  Pulmonary:     Effort: Pulmonary effort is normal.  Abdominal:     Palpations: Abdomen is soft.  Genitourinary:    General: Normal vulva.  Neurological:     Mental Status: She is alert.  Psychiatric:        Mood and Affect: Mood normal.    No results found for this or any previous visit (from the past 24 hour(s)).  No results found.  Assessment/Plan: The patient was counseled regarding the hysteroscopy procedure in detail.  We reviewed risks of bleeding and infection and possible uterine perforation.  We also discussed the removal of any identified polyps or submucosal fibroids and what that would  involve.  The patient desires to proceed.  She will use cytotec 3 hours prior to the procedure to aid with cervical dilation.  [name] 05/23/2021, 5:08 PM

## 2021-05-23 NOTE — Anesthesia Preprocedure Evaluation (Addendum)
Anesthesia Evaluation  Patient identified by MRN, date of birth, ID band Patient awake    Reviewed: Allergy & Precautions, NPO status , Patient's Chart, lab work & pertinent test results  Airway Mallampati: II  TM Distance: >3 FB Neck ROM: Full    Dental  (+) Teeth Intact   Pulmonary sleep apnea and Continuous Positive Airway Pressure Ventilation , former smoker,    Pulmonary exam normal        Cardiovascular negative cardio ROS   Rhythm:Regular Rate:Normal  Atenolol for tachycardia    Neuro/Psych Anxiety Depression Bipolar Disorder    GI/Hepatic negative GI ROS, Neg liver ROS,   Endo/Other  Hypothyroidism   Renal/GU CRFRenal disease  Female GU complaint Uterine polyps    Musculoskeletal negative musculoskeletal ROS (+)   Abdominal (+)  Abdomen: soft. Bowel sounds: normal.  Peds  Hematology negative hematology ROS (+)   Anesthesia Other Findings   Reproductive/Obstetrics                            Anesthesia Physical Anesthesia Plan  ASA: 3  Anesthesia Plan: MAC   Post-op Pain Management:    Induction: Intravenous  PONV Risk Score and Plan: 2 and Ondansetron, Dexamethasone, Propofol infusion, Midazolam and Treatment may vary due to age or medical condition  Airway Management Planned: Simple Face Mask, Natural Airway and Nasal Cannula  Additional Equipment: None  Intra-op Plan:   Post-operative Plan:   Informed Consent: I have reviewed the patients History and Physical, chart, labs and discussed the procedure including the risks, benefits and alternatives for the proposed anesthesia with the patient or authorized representative who has indicated his/her understanding and acceptance.     Dental advisory given  Plan Discussed with: CRNA  Anesthesia Plan Comments: (Lab Results      Component                Value               Date                      WBC                       6.6                 05/24/2021                HGB                      11.3 (L)            05/24/2021                HCT                      35.0 (L)            05/24/2021                MCV                      92.3                05/24/2021                PLT  325                 05/24/2021           Lab Results      Component                Value               Date                      NA                       136                 04/10/2021                K                        4.3                 04/10/2021                CO2                      25                  04/10/2021                GLUCOSE                  77                  04/10/2021                BUN                      13                  04/10/2021                CREATININE               0.94                04/10/2021                CALCIUM                  9.7                 04/10/2021          )       Anesthesia Quick Evaluation

## 2021-05-24 ENCOUNTER — Ambulatory Visit (HOSPITAL_BASED_OUTPATIENT_CLINIC_OR_DEPARTMENT_OTHER): Payer: Federal, State, Local not specified - PPO | Admitting: Anesthesiology

## 2021-05-24 ENCOUNTER — Encounter (HOSPITAL_BASED_OUTPATIENT_CLINIC_OR_DEPARTMENT_OTHER): Payer: Self-pay | Admitting: Obstetrics and Gynecology

## 2021-05-24 ENCOUNTER — Ambulatory Visit (HOSPITAL_BASED_OUTPATIENT_CLINIC_OR_DEPARTMENT_OTHER)
Admission: RE | Admit: 2021-05-24 | Discharge: 2021-05-24 | Disposition: A | Discharge status: Home / Self Care | Payer: Federal, State, Local not specified - PPO | Attending: Obstetrics and Gynecology | Admitting: Obstetrics and Gynecology

## 2021-05-24 ENCOUNTER — Encounter (HOSPITAL_BASED_OUTPATIENT_CLINIC_OR_DEPARTMENT_OTHER)
Admission: RE | Disposition: A | Discharge status: Home / Self Care | Payer: Self-pay | Source: Home / Self Care | Attending: Obstetrics and Gynecology

## 2021-05-24 DIAGNOSIS — N950 Postmenopausal bleeding: Secondary | ICD-10-CM | POA: Diagnosis not present

## 2021-05-24 DIAGNOSIS — Z87891 Personal history of nicotine dependence: Secondary | ICD-10-CM | POA: Diagnosis not present

## 2021-05-24 DIAGNOSIS — E890 Postprocedural hypothyroidism: Secondary | ICD-10-CM | POA: Insufficient documentation

## 2021-05-24 DIAGNOSIS — N952 Postmenopausal atrophic vaginitis: Secondary | ICD-10-CM | POA: Diagnosis not present

## 2021-05-24 DIAGNOSIS — N182 Chronic kidney disease, stage 2 (mild): Secondary | ICD-10-CM | POA: Diagnosis not present

## 2021-05-24 HISTORY — DX: Obstructive sleep apnea (adult) (pediatric): G47.33

## 2021-05-24 HISTORY — DX: Nontoxic multinodular goiter: E04.2

## 2021-05-24 HISTORY — DX: Chronic kidney disease, stage 2 (mild): N18.2

## 2021-05-24 HISTORY — DX: Fatty (change of) liver, not elsewhere classified: K76.0

## 2021-05-24 HISTORY — DX: Irritable bowel syndrome with constipation: K58.1

## 2021-05-24 HISTORY — PX: DILATATION & CURETTAGE/HYSTEROSCOPY WITH MYOSURE: SHX6511

## 2021-05-24 HISTORY — DX: Anxiety disorder, unspecified: F41.9

## 2021-05-24 HISTORY — DX: Tachycardia, unspecified: R00.0

## 2021-05-24 HISTORY — DX: Postprocedural hypothyroidism: E89.0

## 2021-05-24 HISTORY — DX: Carpal tunnel syndrome, bilateral upper limbs: G56.03

## 2021-05-24 HISTORY — DX: Presence of spectacles and contact lenses: Z97.3

## 2021-05-24 HISTORY — DX: Bipolar disorder, unspecified: F31.9

## 2021-05-24 HISTORY — DX: Endometriosis, unspecified: N80.9

## 2021-05-24 HISTORY — DX: Major depressive disorder, single episode, unspecified: F32.9

## 2021-05-24 LAB — CBC
HCT: 35 % — ABNORMAL LOW (ref 36.0–46.0)
Hemoglobin: 11.3 g/dL — ABNORMAL LOW (ref 12.0–15.0)
MCH: 29.8 pg (ref 26.0–34.0)
MCHC: 32.3 g/dL (ref 30.0–36.0)
MCV: 92.3 fL (ref 80.0–100.0)
Platelets: 325 10*3/uL (ref 150–400)
RBC: 3.79 MIL/uL — ABNORMAL LOW (ref 3.87–5.11)
RDW: 13.2 % (ref 11.5–15.5)
WBC: 6.6 10*3/uL (ref 4.0–10.5)
nRBC: 0 % (ref 0.0–0.2)

## 2021-05-24 SURGERY — DILATATION & CURETTAGE/HYSTEROSCOPY WITH MYOSURE
Anesthesia: Monitor Anesthesia Care | Site: Uterus

## 2021-05-24 MED ORDER — PROPOFOL 10 MG/ML IV BOLUS
INTRAVENOUS | Status: AC
  Filled 2021-05-24: qty 20

## 2021-05-24 MED ORDER — KETOROLAC TROMETHAMINE 30 MG/ML IJ SOLN
INTRAMUSCULAR | Status: AC
  Filled 2021-05-24: qty 1

## 2021-05-24 MED ORDER — FENTANYL CITRATE (PF) 100 MCG/2ML IJ SOLN
INTRAMUSCULAR | Status: DC | PRN
  Administered 2021-05-24: 100 ug via INTRAVENOUS

## 2021-05-24 MED ORDER — ACETAMINOPHEN 10 MG/ML IV SOLN: 1000.0000 mg | Freq: Once | INTRAVENOUS | Status: DC | PRN

## 2021-05-24 MED ORDER — KETOROLAC TROMETHAMINE 30 MG/ML IJ SOLN
INTRAMUSCULAR | Status: DC | PRN
  Administered 2021-05-24: 30 mg via INTRAVENOUS

## 2021-05-24 MED ORDER — LIDOCAINE HCL 1 % IJ SOLN
INTRAMUSCULAR | Status: DC | PRN
  Administered 2021-05-24: 20 mL

## 2021-05-24 MED ORDER — FENTANYL CITRATE (PF) 100 MCG/2ML IJ SOLN: 25.0000 ug | INTRAMUSCULAR | Status: DC | PRN

## 2021-05-24 MED ORDER — FENTANYL CITRATE (PF) 100 MCG/2ML IJ SOLN
INTRAMUSCULAR | Status: AC
  Filled 2021-05-24: qty 2

## 2021-05-24 MED ORDER — PROPOFOL 500 MG/50ML IV EMUL
INTRAVENOUS | Status: AC
  Filled 2021-05-24: qty 50

## 2021-05-24 MED ORDER — MIDAZOLAM HCL 5 MG/5ML IJ SOLN
INTRAMUSCULAR | Status: DC | PRN
  Administered 2021-05-24: 2 mg via INTRAVENOUS

## 2021-05-24 MED ORDER — ONDANSETRON HCL 4 MG/2ML IJ SOLN
INTRAMUSCULAR | Status: AC
  Filled 2021-05-24: qty 2

## 2021-05-24 MED ORDER — ONDANSETRON HCL 4 MG/2ML IJ SOLN
INTRAMUSCULAR | Status: DC | PRN
  Administered 2021-05-24: 4 mg via INTRAVENOUS

## 2021-05-24 MED ORDER — SODIUM CHLORIDE 0.9 % IV SOLN: INTRAVENOUS | Status: DC

## 2021-05-24 MED ORDER — PROPOFOL 500 MG/50ML IV EMUL
INTRAVENOUS | Status: DC | PRN
  Administered 2021-05-24: 100 ug/kg/min via INTRAVENOUS

## 2021-05-24 MED ORDER — DEXAMETHASONE SODIUM PHOSPHATE 10 MG/ML IJ SOLN
INTRAMUSCULAR | Status: DC | PRN
  Administered 2021-05-24: 10 mg via INTRAVENOUS

## 2021-05-24 MED ORDER — PROPOFOL 10 MG/ML IV BOLUS
INTRAVENOUS | Status: DC | PRN
  Administered 2021-05-24: 50 mg via INTRAVENOUS

## 2021-05-24 MED ORDER — LIDOCAINE HCL (CARDIAC) PF 100 MG/5ML IV SOSY
PREFILLED_SYRINGE | INTRAVENOUS | Status: DC | PRN
  Administered 2021-05-24: 50 mg via INTRAVENOUS

## 2021-05-24 MED ORDER — DEXAMETHASONE SODIUM PHOSPHATE 10 MG/ML IJ SOLN
INTRAMUSCULAR | Status: AC
  Filled 2021-05-24: qty 1

## 2021-05-24 MED ORDER — MIDAZOLAM HCL 2 MG/2ML IJ SOLN
INTRAMUSCULAR | Status: AC
  Filled 2021-05-24: qty 2

## 2021-05-24 MED ORDER — SODIUM CHLORIDE 0.9 % IR SOLN
Status: DC | PRN
  Administered 2021-05-24: 3000 mL via INTRAVESICAL

## 2021-05-24 MED ORDER — PROMETHAZINE HCL 25 MG/ML IJ SOLN: 6.2500 mg | INTRAMUSCULAR | Status: DC | PRN

## 2021-05-24 SURGICAL SUPPLY — 19 items
CATH ROBINSON RED A/P 16FR (CATHETERS) ×2 IMPLANT
COVER WAND RF STERILE (DRAPES) ×2 IMPLANT
CURETTE PIPELLE ENDOMTRL SUCTN (MISCELLANEOUS) ×1 IMPLANT
DEVICE MYOSURE LITE (MISCELLANEOUS) IMPLANT
DEVICE MYOSURE REACH (MISCELLANEOUS) ×2 IMPLANT
GAUZE 4X4 16PLY RFD (DISPOSABLE) ×2 IMPLANT
GLOVE SURG ENC MOIS LTX SZ6.5 (GLOVE) ×2 IMPLANT
GOWN STRL REUS W/TWL LRG LVL3 (GOWN DISPOSABLE) ×2 IMPLANT
IV NS IRRIG 3000ML ARTHROMATIC (IV SOLUTION) ×2 IMPLANT
KIT PROCEDURE FLUENT (KITS) ×2 IMPLANT
KIT TURNOVER CYSTO (KITS) ×2 IMPLANT
MYOSURE XL FIBROID (MISCELLANEOUS)
NEEDLE SPNL 22GX3.5 QUINCKE BK (NEEDLE) ×2 IMPLANT
PACK VAGINAL MINOR WOMEN LF (CUSTOM PROCEDURE TRAY) ×2 IMPLANT
PAD OB MATERNITY 4.3X12.25 (PERSONAL CARE ITEMS) ×2 IMPLANT
PIPELLE ENDOMETRIAL SUCTION CU (MISCELLANEOUS) ×2
SEAL CERVICAL OMNI LOK (ABLATOR) IMPLANT
SEAL ROD LENS SCOPE MYOSURE (ABLATOR) ×2 IMPLANT
SYSTEM TISS REMOVAL MYOSURE XL (MISCELLANEOUS) IMPLANT

## 2021-05-24 NOTE — Transfer of Care (Signed)
Immediate Anesthesia Transfer of Care Note  Patient: Kristen Leon  Procedure(s) Performed: Pollyann Glen WITH ENDOMETRIAL SAMPLING (Uterus)  Patient Location: PACU  Anesthesia Type:MAC  Level of Consciousness: awake, alert , oriented and patient cooperative  Airway & Oxygen Therapy: Patient Spontanous Breathing  Post-op Assessment: Report given to RN, Post -op Vital signs reviewed and stable and Patient moving all extremities X 4  Post vital signs: Reviewed and stable  Last Vitals:  Vitals Value Taken Time  BP 118/85 05/24/21 0748  Temp    Pulse 92 05/24/21 0752  Resp 16 05/24/21 0752  SpO2 96 % 05/24/21 0752  Vitals shown include unvalidated device data.  Last Pain:  Vitals:   05/24/21 0616  TempSrc: Oral  PainSc: 0-No pain      Patients Stated Pain Goal: 3 (82/57/49 3552)  Complications: No notable events documented.

## 2021-05-24 NOTE — Discharge Instructions (Addendum)
  Post Anesthesia Home Care Instructions  Activity: Get plenty of rest for the remainder of the day. A responsible adult should stay with you for 24 hours following the procedure.  For the next 24 hours, DO NOT: -Drive a car -Advertising copywriter -Drink alcoholic beverages -Take any medication unless instructed by your physician -Make any legal decisions or sign important papers.  Meals: Start with liquid foods such as gelatin or soup. Progress to regular foods as tolerated. Avoid greasy, spicy, heavy foods. If nausea and/or vomiting occur, drink only clear liquids until the nausea and/or vomiting subsides. Call your physician if vomiting continues.  Special Instructions/Symptoms: Your throat may feel dry or sore from the anesthesia or the breathing tube placed in your throat during surgery. If this causes discomfort, gargle with warm salt water. The discomfort should disappear within 24 hours.  If you had a scopolamine patch placed behind your ear for the management of post- operative nausea and/or vomiting:  1. The medication in the patch is effective for 72 hours, after which it should be removed.  Wrap patch in a tissue and discard in the trash. Wash hands thoroughly with soap and water. 2. You may remove the patch earlier than 72 hours if you experience unpleasant side effects which may include dry mouth, dizziness or visual disturbances. 3. Avoid touching the patch. Wash your hands with soap and water after contact with the patch.   DISCHARGE INSTRUCTIONS: HYSTEROSCOPY / ENDOMETRIAL ABLATION The following instructions have been prepared to help you care for yourself upon your return home.  May Remove Scop patch on or before  May take Ibuprofen after 1:45pm   May take stool softner while taking narcotic pain medication to prevent constipation.  Drink plenty of water.  Personal hygiene:  Use sanitary pads for vaginal drainage, not tampons.  Shower the day after your procedure.  NO  tub baths, pools or Jacuzzis for 2-3 weeks.  Wipe front to back after using the bathroom.  Activity and limitations:  Do NOT drive or operate any equipment for 24 hours. The effects of anesthesia are still present and drowsiness may result.  Do NOT rest in bed all day.  Walking is encouraged.  Walk up and down stairs slowly.  You may resume your normal activity in one to two days or as indicated by your physician. Sexual activity: NO intercourse for at least 2 weeks after the procedure, or as indicated by your Doctor.  Diet: Eat a light meal as desired this evening. You may resume your usual diet tomorrow.  Return to Work: You may resume your work activities in one to two days or as indicated by Therapist, sports.  What to expect after your surgery: Expect to have vaginal bleeding/discharge for 2-3 days and spotting for up to 10 days. It is not unusual to have soreness for up to 1-2 weeks. You may have a slight burning sensation when you urinate for the first day. Mild cramps may continue for a couple of days. You may have a regular period in 2-6 weeks.  Call your doctor for any of the following:  Excessive vaginal bleeding or clotting, saturating and changing one pad every hour.  Inability to urinate 6 hours after discharge from hospital.  Pain not relieved by pain medication.  Fever of 100.4 F or greater.  Unusual vaginal discharge or odor.  Return to office _________________Call for an appointment ___________________ Patient's signature: ______________________ Nurse's signature ________________________  Post Anesthesia Care Unit [phone]

## 2021-05-24 NOTE — Op Note (Signed)
Operative Note    Preoperative Diagnosis Postmenopausal bleeding  Postoperative Diagnosis Same with atrophic vagina and uterine cavity  Procedure Diagnostic hysteroscopy and endometrial sampling  Surgeon Paula Compton, MD  Anesthesia MAC  Fluids: EBL 1m UOP 1565mIVF 5001mR  Findings The vagina was very friable from atrophic change and even the prep made it bleed lightly. The uterine cavity was atrophic an no polyps noted.  Specimen Pipelle of endometrial cavity  Procedure Note  Patient was taken to the operating room where MAC anesthesia was obtained without difficulty. She was then prepped and draped in the normal sterile fashion in the dorsal lithotomy position. An appropriate time out was performed. A speculum was then placed within the vagina and the anterior lip of the cervix identified and injected with approximately 2 cc of 1% plain lidocaine. An additional 9 cc each was placed at 2 and 10:00 for a paracervical block. Uterus was then sounded to approximately 8 cm and the Pratt dilators utilized to dilate the cervix up to approximately 19.  The Myosure operating scope was then introduced into the cervix and the cavity inspected with findings of an atrophic cavity noted.  There were no polyps noted. The operating scope was then removed from the cervix a small pipelle inserted into the uterine fundus. A gentle sampling was performed with minimal tissue obtained given the atrophic appearance of the cavity.. All tissue specimens were handed off to pathology. The tenaculum was then removed from the cervix and the site was noted to be hemostatic. Finally the speculum was removed from the vagina and the patient awakened and taken to the recovery room in good condition.

## 2021-05-24 NOTE — Interval H&P Note (Signed)
History and Physical Interval Note:  05/24/2021 6:55 AM  Kristen Leon  has presented today for surgery, with the diagnosis of polyp of corpus uteri.  The various methods of treatment have been discussed with the patient and family. After consideration of risks, benefits and other options for treatment, the patient has consented to  Procedure(s): DILATATION & CURETTAGE/HYSTEROSCOPY WITH MYOSURE lite with fluid system (N/A) as a surgical intervention.  The patient's history has been reviewed, patient examined, no change in status, stable for surgery.  I have reviewed the patient's chart and labs.  Questions were answered to the patient's satisfaction.     [name]

## 2021-05-24 NOTE — Anesthesia Postprocedure Evaluation (Signed)
Anesthesia Post Note  Patient: Kristen Leon  Procedure(s) Performed: Pollyann Glen WITH ENDOMETRIAL SAMPLING (Uterus)     Patient location during evaluation: PACU Anesthesia Type: MAC Level of consciousness: awake and alert Pain management: pain level controlled Vital Signs Assessment: post-procedure vital signs reviewed and stable Respiratory status: spontaneous breathing, nonlabored ventilation, respiratory function stable and patient connected to nasal cannula oxygen Cardiovascular status: stable and blood pressure returned to baseline Postop Assessment: no apparent nausea or vomiting Anesthetic complications: no   No notable events documented.  Last Vitals:  Vitals:   05/24/21 0830 05/24/21 0900  BP: 110/80 112/78  Pulse: 79 78  Resp: 18 18  Temp:  36.7 C  SpO2: 95% 96%    Last Pain:  Vitals:   05/24/21 0900  TempSrc:   PainSc: 0-No pain                 Belenda Cruise P Beckham [place]ran

## 2021-05-25 LAB — SURGICAL PATHOLOGY

## 2021-05-26 ENCOUNTER — Encounter (HOSPITAL_BASED_OUTPATIENT_CLINIC_OR_DEPARTMENT_OTHER): Payer: Self-pay | Admitting: Obstetrics and Gynecology

## 2021-06-01 ENCOUNTER — Telehealth (INDEPENDENT_AMBULATORY_CARE_PROVIDER_SITE_OTHER): Payer: Federal, State, Local not specified - PPO | Admitting: Psychiatry

## 2021-06-01 ENCOUNTER — Encounter: Payer: Self-pay | Admitting: Psychiatry

## 2021-06-01 DIAGNOSIS — F411 Generalized anxiety disorder: Secondary | ICD-10-CM

## 2021-06-01 DIAGNOSIS — Z79899 Other long term (current) drug therapy: Secondary | ICD-10-CM

## 2021-06-01 DIAGNOSIS — G3184 Mild cognitive impairment, so stated: Secondary | ICD-10-CM

## 2021-06-01 DIAGNOSIS — F422 Mixed obsessional thoughts and acts: Secondary | ICD-10-CM | POA: Diagnosis not present

## 2021-06-01 DIAGNOSIS — F5105 Insomnia due to other mental disorder: Secondary | ICD-10-CM

## 2021-06-01 DIAGNOSIS — F4000 Agoraphobia, unspecified: Secondary | ICD-10-CM | POA: Diagnosis not present

## 2021-06-01 DIAGNOSIS — F3162 Bipolar disorder, current episode mixed, moderate: Secondary | ICD-10-CM | POA: Diagnosis not present

## 2021-06-01 NOTE — Patient Instructions (Addendum)
Stop gabapentin Stop mirtazapine DT NR Retry Doxepin at higher dose of 50 mg up to 100 mg HS  Once sleeping well then try to taper Seroquel very slowly such as reduce by 50 mg every 2 to 3 weeks or even slower if necessary.

## 2021-06-01 NOTE — Progress Notes (Signed)
Kristen Leon [id number] [birth year] 55 y.o.  Video Visit via My Chart  I connected with pt by My Chart and verified that I am speaking with the correct person using two identifiers.   I discussed the limitations, risks, security and privacy concerns of performing an evaluation and management service by My Chart  and the availability of in person appointments. I also discussed with the patient that there may be a patient responsible charge related to this service. The patient expressed understanding and agreed to proceed.  I discussed the assessment and treatment plan with the patient. The patient was provided an opportunity to ask questions and all were answered. The patient agreed with the plan and demonstrated an understanding of the instructions.   The patient was advised to call back or seek an in-person evaluation if the symptoms worsen or if the condition fails to improve as anticipated.  I provided 30 minutes of video time during this encounter.  The patient was located at home and the provider was located office. Session started 2:45 PM and ended 3:15 PM  Subjective:   Patient ID:  Kristen Leon is a 55 y.o. (DOB Dec 20, 1965) female.  Chief Complaint:  Chief Complaint  Patient presents with  . Follow-up  . Bipolar I disorder (HCC)  . Sleeping Problem  . Memory Loss    Medication Refill Associated symptoms include abdominal pain. Pertinent negatives include no chest pain or weakness.    [name] presents  today for follow-up ofBipolar disorder, generalized anxiety disorder, nocturnal panic attacks, history of OCD, and mild cognitive impairment and worsening driving anxiety.  She has a goal of getting off the Seroquel in hopes of improving memory.  at visit August 06, 2019.  We needed to start a mood stabilizer and discussed variety of options and decided to start with Latuda.  We started at 20 mg and the plan was to increase gradually to 80 mg daily.  If that was  successful this follow-up was going to lead to a potential reduction in an attempt to wean Seroquel. Never got up to 80 mg Latuda.    Did not noticed much of a difference.  visit September 03, 2019.  The following changes were made: Increase vitamin D from 5000 units daily to 10K units daily Start Latuda  80 mg daily Start reduction in Seroquel next week and reduce once every 3 weeks. Reduced Seroquel from 600 to 200 mg HS.  At first had sleep and mood problems and angry.  Added in  gabapentin 300 mg total week ago and sleep better.  Able to sleep on her own.  Mood is better also.  visit October 12, 2019.  The following changes were made: Increase vitamin D from 5000 units daily to 10K units daily Continue Latuda  80 mg daily Continue gradual reduction in Seroquel next week and reduce once every 3 to 4 weeks by 50 mg. Trying to get rid of gabapentin and Seroquel for cognitive reasons primarily  seen December 16, 2019.  The following was noted: Down to Seroquel 150 HS for 3 weeks.  Quality of sleepy slightly off.  How long on this dose?  Tends to wake 30 min earlier than usual.  Bought magnesium.   Feels fine in the day.  Slightly less harried and mentally cloudy with reduction in Seroquel. Likes the [place] as mood medication.   Overall mood and anxiety are under control.  Other concern is focus and memory. Wants to get off gabapentin and Seroquel  for that reason.  Feels the meds may increase risk of dementia.  Overall she has seen some cognitive improvement since the reduction in Seroquel as noted. She was in courage to try reducing the Seroquel further gradually.  She was encouraged to leave the gabapentin at 300 mg daily because it was helpful.  seen March 14, 2020.  She was not doing well.  The following was noted: Couldn't sleep without Seroquel 200 mg.  Felt unstable and anxious.  For weeks felt she still struggled at 200 mg Seroquel and raised the dose all the way back up to where she  started bc felt desperate and was on edge.   Early 2000's not on Seroquel and had a lot of anxiety and obsessive thought.  It helps anxiety. Mood better on Seroquel and feels better physically and mentally.  A little down with stress and failure of switch to Jordan.  little anger but not much depression and no other sx of mania.    Driving anxiety and sense unreality resolved.  Fine with driving now. The following changes were made: Continue vitamin D from 5000 units daily to 10K units daily Wean Latuda to 40 mg for a week and then stop it. Then start Saphris 5mg  HS and reduce Seroquel 400 mg at night for 5 days, then increase Saphris 10 mg at night and reduce Seroquel to 200 mg at night for 5 days, then increase Saphris to 15 HS and stop sEroquel.  04/12/2020 appointment the following is noted: As noted pt has been wanting off Seroquel so med changes last visit wre intended to help. More anxious with reduced gabapentin and Seroquel.  Hard to stop Seroquel bc both anxiety and insomnia and using 100 mg Seroquel.  Missed a day of work over it yesterday.  More sleep with Saphris than Latuda.  No SE with saphris. Anxiety included fidgety. Hasn't tried Saphris 15 mg daily yet.   Has felt in a little less foggy headed with reduced Seroquel and gabapentin.  Down on gabapentin for a while now and only a little change with that reduction.  A little better focus and memory now. Neuropsych testing next week. Plan: Increase Saphris 15 mg HS and stop Seroquel again.  Can reduce Seroquel to 50 or even lower if needed.    05/09/2020 phone call with the following noted: Patient had to go back up to 600 mg Seroquel at hs, she has tried the last few nights at 500 mg. She's been unsuccessful tapering down and just wanted to have 200 mg and 100 mg tablets available to try and get it lowered.   07/07/2020 appointment with the following noted: Got close to off Seroquel but felt untethered and trouble with sleep initial and  terminal. 5-6 hours on low dose Seroquel. Never tried Saphris 15 mg for reasons that are unclear.  Stopped shortly after the phone session from last time. Started Seroquel by 2002 and gabapentin in 2003.  Finds it very difficult to get off the meds and worry over instability. Plan: Start Saphris 10 mg at night and reduce Seroquel to 400 mg nightly for 4 nights, Then increase Saphris to 15 mg at night and reduce Seroquel to 200 mg for 4 nights, Then reduce Seroquel to 100 mg and continue Saphris at 15 mg nightly for 4 nights, Then stop Seroquel and if insomnia occurs, increase Saphris to 20 mg at night.  07/19/2020 phone call.  Restless legs on Saphris.  Given instructions to change to loxapine  100 mg nightly. 07/25/2020 phone call patient stating she wanted to try to continue Saphris but have treatment for the restless legs side effects. Prescription sent for ropinirole  09/13/2020 appointment with the following noted: Not taking Saphris.  At 10 mg HS CO jitteriness and anxiety the next day. Never took loxapine. Wonders about taking loxapine now.  Plan: Give her this change in instructions for transition from Seroquel to loxapine: Start loxapine 25 mg capsule 1 at night and reduce Seroquel to 400 mg nightly for 4 nights, Then increase loxapine to 2 capsules at night and reduce Seroquel to 200 mg for 4 nights, Then reduce Seroquel to 100 mg and increase loxapine to 3 capsules nightly for 4 nights, Then stop Seroquel and if insomnia occurs, increase loxapine to 4 capsules at night and notify MD.   Multiple phone calls since the last visit including the following. 10/24/2020:Telephone call to patient.  She experiences an increase in anxiety whenever she reduces the Seroquel.  She is just started the transition from Seroquel to loxapine.  She is tolerating the loxapine well.  Encouraged her to just use the lorazepam and transition from Seroquel to loxapine.  It is hoped and expected that the  loxapine will effectively manage anxiety as the Seroquel when she is on the full dose.  She agrees to the plan.  Lorazepam prescription sent in. 11/17/2020: RTC  See prior note.  Took 50 mg Seroquel last night.  Realized she made a mistake in dosing.  She stopped from 200 mg Seroquel instead of reducing to 100 mg as instructed.  Had gotten up to 4 loxapine without seroquel was jittery and like she'd come out of her skin and took lorazepam.  Still some problems with memory.  Last night took 50 mg Seroquel and was a little better. Much better today than yesterday.   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   Memory testing with visuospatial problems and was worked up over that.   Take 4 loxapine and start 75 mg Seroquel and taper from there more slowly. Appt 11/22/20  11/22/2020:Pt requesting Rx for Loxapine 25 mg 4/d  30 day and Seroquel 200 mg 3/d   30 day and Lorazepam 0.5 mg 1-2 tab every 8 hrs PRN for anxiety  @ [facility] [employer] on file. Apt RS from 12/21 to 2/9 and on canc list. Pt going out of town 12/27-1/3 asking for Rx before 12/27.  11/22/2020 MD response: I sent in the prescription for lorazepam.  Patient is not on quetiapine 200 mg 3 daily and I will not prescribe that dose.  Ask her what dose of quetiapine she is actually taking.  Also the insurance will not cover 4 loxapine capsules daily.  If she is taking 4 I will need to change it to 2 of the 50 mg capsules.  Please verify her current dose of loxapine and quetiapine.  I am weaning her off of quetiapine she should not be on that high of a dose of quetiapine. 11/23/2020:Pt called back stating she would like to stop Loxapine completely. She is having blurred vision and jittery. She did not pick up Loxapine Rx. She is currently taking quetiapine 100 mg (4-25 mg tabs daily). Requesting to go back to 600 mg daily. Requesting Rx for this.  Agreed 12/01/2020:Alania called to request that you  decrease her gabapentin and prescribe Buspar.  Please call to discuss this.  appt 01/11/21.  MD response: Took  buspar years ago and wants to try it again and reduce gabapentin.  Currently on 1200 mg HS.  Reduce to 900 mg gabapentin.   Start buspirone 5 mg twice daily, and increase to 15 mg BID. Disc SE.  She agrees.  01/11/2021 appointment with the following noted: Completely off gabapentin for a couple of weeks maybe.  Initially anxiety a little high in AM.  Increased buspirone. Had increase anxiety when off Seroquel.  Was off about 4 days and memory was not better.  Anxiety is better now. Anxiety is a little higher off gabapentin but not unmanageable.  Sleep is not as good off the gabapentin.  More initial insomnia and takes melatonin to manage. Not marked difference with cognition off gabapentin. Read about antipsychotics reducing brain volume and still wants to stop Seroquel despite multiple failed attempts. Wants to switch from Seroquel to trazodone.  Plan: OK trial Seroquel reduction to 400 mg daily with no other med changes.  02/17/2021 appointment with the following noted: Stopped buspirone without change. Stopped gabapentin. Reduced Seroquel to 400 mg HS. She is convinced antipsychotic is causing brain loss.  She wants to be off antipsychotics bc of this concern and risk of dementia.  Don't think fears of dementia are unfounded. Doesn't believe she has psychotic sx. Wants to retry lithium and then trazodone in place of Seroquel.  Took lithium a long time.   Acknowledges prior failures weaning Seroquel multiple times but thinks maybe it was too fast.  Sleep most of the night and sleep less deep than 600 mg Seroquel.  Also less deep without gabapentin.  Altogether 6-7 hours but leaves the TV on.   In bed 8 watching TV and doesn't try to sleep until 1030. Plan: She  failed attempts to get off Seroquel multiple times. OK trial Seroquel reduction to 300 mg daily for 3-4 weeks, then 200 mg  for 3-4 weeks. Disc withdrawal effects from Seroquel. Start lithium 300 mg nightly for a week then 600 mg nightly. Wait a week and get blood level.    04/12/2021 appointment with the following noted: Did get down to Seroquel 200 mg HS and up to lithium 600 HS and trazodone 100 mg HS. Sleep more interrupted but 6-7 hours with poor quality and less well rested with change.  Mood really good with lithium and maybe better. Often had rapid cycling and it seemed to stop. But "sleep sucks" and feels it affects her during the day Still takes mirtazapine 15 mg and added trazodone.  Sleep worse without trazodone. No SE with lithium. Cognition  more clear with better focus with less Seroquel.   Patient reports stable mood and denies depressed or irritable moods.  Patient denies any recent difficulty with anxiety, surprisingly. Denies appetite disturbance.  Patient reports that energy and motivation have been good.  Patient denies any difficulty with concentration.  Patient denies any suicidal ideation.  Plan: Continue Seroquel  200 mg for 3-4 weeks. Continue lithium 600 mg nightly. Check another level  Stop mirtazapine and trazodone Doxepin 10-30 mg HS   04/24/2021 phone call complaining of obsessive thinking and wanting medicine sent into the pharmacy. MD response: Her case is very complicated because she has complained of side effects from atypicals and worried about cognitive side effects of benzodiazepines.  She is bipolar and therefore we cannot use SSRIs.  She does not want to take atypical antipsychotics.  She is on a low dose of Seroquel now and I am sure does not want to  increase the dosage back up.  She has a as needed prescription for lorazepam she can use that.  I will not make any other major med changes over the phone outside of a scheduled appointment.  If the symptoms are bothersome enough however going to the cancellation list and we will attempt to get her into an appointment  sooner.  06/01/2021 appointment with the following noted: Thinks she stopped trazodone and ? Took doxepin.  She vaguely think she tried it and it didn't help sleep. Mood more stable on lithium 600 HS.  Prior mood cycling into dep and mania have resolved on it.. Still problems with her sleep. Increased Seroquel to 400 mg on her own for sleep added gabapentin 600 and sleep is better but wants off both of them. Still on mirtazapine 30.   Cholecystectomy scheduled for August. Had stopped Seroquel and then had intrusive thoughts which have now resolved.  At the time she called they were bad.  When manic feels more reckless, spending, talk to herself, driven, reduced sleep, irritable and angry.  Past Psychiatric Medication Trials: Vraylar 3 SE, Abilify, brief Latuda ? effect,  Depakote NR, carbamazepine questionable side effects, lamotrigine lost response, Trileptal,  lithium no response, clozapine, olanzapine , Seroquel 600, risperidone 1 mg twice daily  , Saphris CO anxiety mirtazapine,  Gabapentin cog SE,  Trazodone 200 several SSRIs, clomipramine had vision side effects,   failed an attempt to switch from Seroquel to Vraylar early 2020  Failed switch from Seroquel to JordanLatuda, Saphris, Vraylar and loxapine No psych hosp.  Review of Systems:  Review of Systems  Cardiovascular:  Negative for chest pain and palpitations.  Gastrointestinal:  Positive for abdominal pain.  Neurological:  Negative for dizziness, tremors, weakness and light-headedness.  Psychiatric/Behavioral:  Positive for decreased concentration and sleep disturbance. Negative for agitation, behavioral problems, confusion, dysphoric mood, hallucinations, self-injury and suicidal ideas. The patient is not nervous/anxious and is not hyperactive.    Medications: I have reviewed the patient's current medications.  Current Outpatient Medications  Medication Sig Dispense Refill  . Acetylcysteine (NAC) 600 MG CAPS Take 1 capsule  (600 mg total) by mouth daily. 90 capsule 1  . atenolol (TENORMIN) 50 MG tablet Take 50 mg by mouth at bedtime.    . B Complex Vitamins (VITAMIN B-COMPLEX) TABS Take 1 tablet by mouth at bedtime.    . Calcium Carbonate-Vitamin D (CALCIUM-D PO) Take by mouth at bedtime.    . Cholecalciferol (VITAMIN D3) 5000 units CAPS Take 1 capsule by mouth at bedtime.    . Dapsone 7.5 % GEL Apply topically daily. face    . diclofenac Sodium (VOLTAREN) 1 % GEL Apply topically 4 (four) times daily as needed.    . folic acid (FOLVITE) 400 MCG tablet Take 400 mcg by mouth at bedtime.    . gabapentin (NEURONTIN) 600 MG tablet Take 600 mg by mouth at bedtime.    Marland Kitchen. levothyroxine (SYNTHROID, LEVOTHROID) 112 MCG tablet Take 112 mcg by mouth at bedtime.    Marland Kitchen. linaclotide (LINZESS) 290 MCG CAPS capsule Take 290 mcg by mouth at bedtime.    Marland Kitchen. lithium carbonate 300 MG capsule 1 at night for 1 week, then 2 at night (Patient taking differently: Take 600 mg by mouth daily.) 180 capsule 0  . LORazepam (ATIVAN) 0.5 MG tablet Take 1-2 tablets (0.5-1 mg total) by mouth every 8 (eight) hours as needed for anxiety. 50 tablet 0  . MAGNESIUM PO Take 1 tablet by mouth at bedtime.    .Marland Kitchen  memantine (NAMENDA) 10 MG tablet TAKE 1 TABLET TWICE A DAY 180 tablet 0  . mirtazapine (REMERON) 30 MG tablet Take 30 mg by mouth at bedtime.    Marland Kitchen QUEtiapine (SEROQUEL) 200 MG tablet Take 1 tablet (200 mg total) by mouth at bedtime. (Patient taking differently: Take 400 mg by mouth at bedtime.) 90 tablet 0  . temazepam (RESTORIL) 15 MG capsule Take 15 mg by mouth at bedtime as needed.    . tretinoin (RETIN-A) 0.025 % cream Apply topically at bedtime. face    . doxepin (SINEQUAN) 10 MG capsule Take 1-3 capsules (10-30 mg total) by mouth at bedtime as needed. (Patient not taking: Reported on 06/01/2021) 60 capsule 0  . lidocaine (LIDODERM) 5 % Place 1 patch onto the skin as needed. Remove & Discard patch within 12 hours or as directed by MD (Patient not  taking: Reported on 06/01/2021)     No current facility-administered medications for this visit.    Medication Side Effects: None  Allergies: No Known Allergies  Past Medical History:  Diagnosis Date  . Anxiety   . Bipolar 1 disorder (HCC)    followed by dr c. cottle  . Carpal tunnel syndrome on both sides   . CKD (chronic kidney disease), stage II    followed by pcp  . Endometriosis   . Hypothyroidism, postablative    endocrinologist--- dr c. Dewitt Hoes--- dx toxic multinodular thyroid 2000 w/ hyperthyroidism s/p RAI  2000, 2002, and 2004;  post hypothryoid in 2008;   last bx 2012 left side, benign per pt  . Irritable bowel syndrome with constipation    followed by dr w. Jason Fila (gi)  . MDD (major depressive disorder)   . Multinodular thyroid    in 2000 dx toxic post ablative  . NAFLD (nonalcoholic fatty liver disease)   . OSA (obstructive sleep apnea)    05-18-2021  per pt study done approx. 2018, told moderat OSA,  does uses cpap  . Tachycardia    cardiologist--- Guerry Bruin NP @ [facility] in [place],  w/ palpitations, controlled w/ atenolol;  pt has had work-up per note echo 2014 normal ,  event monitor 2018 showedST w/ mild ST no arrhythmia,  ETT 02/ 2022 normal no ischemia, ef 67%  . Wears contact lenses     History reviewed. No pertinent family history.  Social History   Socioeconomic History  . Marital status: Divorced    Spouse name: Not on file  . Number of children: Not on file  . Years of education: Not on file  . Highest education level: Not on file  Occupational History  . Not on file  Tobacco Use  . Smoking status: Former    Years: 5.00    Pack years: 0.00    Types: Cigarettes    Quit date: 2000    Years since quitting: 22.5  . Smokeless tobacco: Never  Vaping Use  . Vaping Use: Never used  Substance and Sexual Activity  . Alcohol use: Yes    Alcohol/week: 1.0 standard drink    Types: 1 Standard drinks or equivalent per week  . Drug use: Never  . Sexual  activity: Not on file  Other Topics Concern  . Not on file  Social History Narrative  . Not on file   Social Determinants of Health   Financial Resource Strain: Not on file  Food Insecurity: Not on file  Transportation Needs: Not on file  Physical Activity: Not on file  Stress: Not on file  Social Connections: Not on file  Intimate Partner Violence: Not on file    Past Medical History, Surgical history, Social history, and Family history were reviewed and updated as appropriate.   Please see review of systems for further details on the patient's review from today.   Objective:   Physical Exam:  There were no vitals taken for this visit.  Physical Exam Neurological:     Mental Status: She is alert and oriented to person, place, and time.     Cranial Nerves: No dysarthria.  Psychiatric:        Attention and Perception: She is inattentive.        Mood and Affect: Mood is not anxious or depressed.        Speech: Speech normal. Speech is not rapid and pressured or slurred.        Behavior: Behavior is cooperative.        Thought Content: Thought content normal. Thought content is not paranoid or delusional. Thought content does not include homicidal or suicidal ideation. Thought content does not include homicidal or suicidal plan.        Cognition and Memory: She exhibits impaired recent memory. She does not exhibit impaired remote memory.        Judgment: Judgment normal.     Comments: Insight and judgment appear good. Denies mania symptoms or severe depression irritable.  Anxiety is better at moment. Recent intrusive thoughts within the last couple of weeks resolved.    Lab Review:     Component Value Date/Time   NA 136 04/10/2021 1035   K 4.3 04/10/2021 1035   CL 97 04/10/2021 1035   CO2 25 04/10/2021 1035   GLUCOSE 77 04/10/2021 1035   BUN 13 04/10/2021 1035   CREATININE 0.94 04/10/2021 1035   CALCIUM 9.7 04/10/2021 1035       Component Value Date/Time   WBC  6.6 05/24/2021 0630   RBC 3.79 (L) 05/24/2021 0630   HGB 11.3 (L) 05/24/2021 0630   HCT 35.0 (L) 05/24/2021 0630   PLT 325 05/24/2021 0630   MCV 92.3 05/24/2021 0630   MCH 29.8 05/24/2021 0630   MCHC 32.3 05/24/2021 0630   RDW 13.2 05/24/2021 0630    Lithium Lvl  Date Value Ref Range Status  04/10/2021 0.8 0.5 - 1.2 mmol/L Final    Comment:    Plasma concentration of 0.5 - 0.8 mmol/L are advised for long-term use; concentrations of up to 1.2 mmol/L may be necessary during acute treatment.                                  Detection Limit = 0.1                           <0.1 indicates None Detected   04/10/2021 lithium level 0.8 on 600 mg daily.  No results found for: PHENYTOIN, PHENOBARB, VALPROATE, CBMZ   Took lab orders to PCP from visit in September and reports results: B12 >2000 Folate > 20 D 43.5 Iron TIBC 297 (250-450)  UIBC 156 (131-425)  Iron 141 (27-159)  Iron saturation 47 (15-55)   .res Assessment: Plan:    Bipolar 1 disorder, mixed, moderate (HCC)  Generalized anxiety disorder  Mixed obsessional thoughts and acts  Agoraphobia  MCI (mild cognitive impairment)  Insomnia due to mental condition  Lithium use   Greater than 50% of  30-minute video time with patient was spent on counseling and coordination of care. We discussed nature of her psychiatric diagnoses .   She remains motivated to get off Seroquel because she believes is causing cognitive problems. Bipolar disorder Was worse with reduction in Seroquel until addtion of lithium has helped so far. Failed switch from Seroquel to [place], Saphris, Vraylar and loxapine   She then wanted to consider retrying lithium.  Chart says it failed but she says trial was 20 years ago. Counseled patient regarding potential benefits, risks, and side effects of lithium to include potential risk of lithium affecting thyroid and renal function.  Discussed need for periodic lab monitoring to determine drug level and to  assess for potential adverse effects.  Counseled patient regarding signs and symptoms of lithium toxicity and advised that they notify office immediately or seek urgent medical attention if experiencing these signs and symptoms.  Patient advised to contact office with any questions or concerns.  Discussed her questions about progression of underlying bipolar disorder and possible neuro degenerative changes.  She is read that antipsychotics reduce brain volume.  This is controversial because the conditions that require antipsychotics themselves such as schizophrenia and bipolar disorder themselves reduce brain volume so there is debate on that subject.  Discussed this again in detail as she has again failed to be able to reduce the Seroquel without complications of not only insomnia but also intrusive thoughts.  These thoughts resolved when she increased the Seroquel to 400 mg nightly.  Furthermore there appears to be no other option for mood stabilization in her case besides an antipsychotic.  So instead we will attempt to reduce the Seroquel to the lowest effective dose.  She  failed attempts to get off Seroquel multiple times as noted above.  On the other hand lithium has provided some additional mood stability which has been very positive and hopeful and may improve the odds of success at reducing the dose of Seroquel.  Continue lithium 600 mg nightly. Check another level before next appt  Sleep hygiene incl no bed without sleep.  Sleep options Belsomra, Dayvigo, doxepin, Rozerem, hydroxyzine.  Prefer avoid BZ and bc cognitive concerns she has.  Stop mirtazapine DT NR Retry Doxepin at higher dose of 50 mg up to 100 mg HS Stop gabapentin Stop mirtazapine DT NR Retry Doxepin at higher dose of 50 mg up to 100 mg HS Once sleeping well then try to taper Seroquel very slowly such as reduce by 50 mg every 2 to 3 weeks or even slower if necessary.  OK prn lorazepam.  Caution re driving and drowsiness.   We discussed the short-term risks associated with benzodiazepines including sedation and increased fall risk among others.  Discussed long-term side effect risk including dependence, potential withdrawal symptoms, and the potential eventual dose-related risk of dementia.  Discussed potential metabolic side effects associated with atypical antipsychotics, as well as potential risk for movement side effects. Advised pt to contact office if movement side effects occur.   Disc cognitive risks at length with meds and risks of untreated bipolar disorder for cognition as well.  She is taking Namenda 10 mg twice daily off label for help with this symptom. Disc the off-label use of N-Acetylcysteine at 600 mg daily to help with mild cognitive problems.  It can be combined with a B-complex vitamin as the B-12 and folate have been shown to sometimes enhance the effect.  Disc availability problems with NAC.  She took it in the past.  Thyroid was normal recently. B12 and folate are normal D is OK but goal with her should be 50s-60s Disc reasons in detail including Dr. Charm Barges recommendation. .  Disc neuroprotective literature on lithium. Dr. Arlie Solomons article  Greater than 50% of 30-minutevideo time with patient was spent on counseling and coordination of care.   FU 6 weeks  Meredith Staggers, MD, DFAPA   Please see After Visit Summary for patient specific instructions.  No future appointments.   No orders of the defined types were placed in this encounter.     -------------------------------

## 2021-06-10 ENCOUNTER — Other Ambulatory Visit: Payer: Self-pay | Admitting: Psychiatry

## 2021-06-10 DIAGNOSIS — Z79899 Other long term (current) drug therapy: Secondary | ICD-10-CM

## 2021-06-10 DIAGNOSIS — F3162 Bipolar disorder, current episode mixed, moderate: Secondary | ICD-10-CM

## 2021-06-12 ENCOUNTER — Other Ambulatory Visit: Payer: Self-pay

## 2021-06-24 ENCOUNTER — Other Ambulatory Visit: Payer: Self-pay | Admitting: Psychiatry

## 2021-06-24 DIAGNOSIS — F3162 Bipolar disorder, current episode mixed, moderate: Secondary | ICD-10-CM

## 2021-07-05 ENCOUNTER — Telehealth: Payer: Federal, State, Local not specified - PPO | Admitting: Psychiatry

## 2021-07-11 ENCOUNTER — Telehealth: Payer: Federal, State, Local not specified - PPO | Admitting: Psychiatry

## 2021-07-22 ENCOUNTER — Other Ambulatory Visit: Payer: Self-pay | Admitting: Psychiatry

## 2021-07-22 DIAGNOSIS — F3162 Bipolar disorder, current episode mixed, moderate: Secondary | ICD-10-CM

## 2021-08-09 ENCOUNTER — Other Ambulatory Visit: Payer: Self-pay

## 2021-08-09 MED ORDER — DOXEPIN HCL 10 MG PO CAPS: 10.0000 mg | ORAL_CAPSULE | Freq: Every evening | ORAL | 0 refills | Status: DC | PRN

## 2021-08-23 ENCOUNTER — Telehealth: Payer: Federal, State, Local not specified - PPO | Admitting: Psychiatry

## 2021-08-26 ENCOUNTER — Other Ambulatory Visit: Payer: Self-pay

## 2021-08-26 ENCOUNTER — Emergency Department
Admission: EM | Admit: 2021-08-26 | Discharge: 2021-08-26 | Disposition: A | Discharge status: Home / Self Care | Payer: Federal, State, Local not specified - PPO | Source: Home / Self Care

## 2021-08-26 ENCOUNTER — Emergency Department: Admit: 2021-08-26 | Payer: Self-pay

## 2021-08-26 DIAGNOSIS — N3001 Acute cystitis with hematuria: Secondary | ICD-10-CM

## 2021-08-26 DIAGNOSIS — R309 Painful micturition, unspecified: Secondary | ICD-10-CM

## 2021-08-26 LAB — POCT URINALYSIS DIP (MANUAL ENTRY)
Bilirubin, UA: NEGATIVE
Glucose, UA: NEGATIVE mg/dL
Nitrite, UA: NEGATIVE
Protein Ur, POC: >=300 mg/dL — AB
Spec Grav, UA: >=1.03 — AB (ref 1.010–1.025)
Urobilinogen, UA: 0.2 E.U./dL
pH, UA: 6 (ref 5.0–8.0)

## 2021-08-26 MED ORDER — NITROFURANTOIN MONOHYD MACRO 100 MG PO CAPS: 100.0000 mg | ORAL_CAPSULE | Freq: Two times a day (BID) | ORAL | 0 refills | Status: AC

## 2021-08-26 NOTE — Discharge Instructions (Addendum)
Advised patient to take medication as directed with food to completion.  Encourage patient increase daily water intake while taking this medication.  Advised patient we would follow-up with her once urine culture results are obtained.

## 2021-08-26 NOTE — ED Triage Notes (Signed)
Pt states that she has some pain with urination and urinary frequency. X1 day

## 2021-08-26 NOTE — ED Provider Notes (Signed)
[facility]    CSN: [id number] Arrival date & time: 08/26/21  1111      History   Chief Complaint Chief Complaint  Patient presents with   Urinary Frequency    Pt states that she has some urinary frequency and pain with urination. X1 day    HPI Kristen Leon is a 55 y.o. female.   HPI 55 year old female presents with dysuria and urinary frequency for 1 day.  Patient reports recent cholecystectomy on 07/20/2021.  Past Medical History:  Diagnosis Date   Anxiety    Bipolar 1 disorder (HCC)    followed by dr c. cottle   Carpal tunnel syndrome on both sides    CKD (chronic kidney disease), stage II    followed by pcp   Endometriosis    Hypothyroidism, postablative    endocrinologist--- dr c. Dewitt Hoes--- dx toxic multinodular thyroid 2000 w/ hyperthyroidism s/p RAI  2000, 2002, and 2004;  post hypothryoid in 2008;   last bx 2012 left side, benign per pt   Irritable bowel syndrome with constipation    followed by dr w. Jason Fila ([facility])   MDD (major depressive disorder)    Multinodular thyroid    in 2000 dx toxic post ablative   NAFLD (nonalcoholic fatty liver disease)    OSA (obstructive sleep apnea)    05-18-2021  per pt study done approx. 2018, told moderat OSA,  does uses cpap   Tachycardia    cardiologist--- Guerry Bruin NP @ [facility] in [place],  w/ palpitations, controlled w/ atenolol;  pt has had work-up per note echo 2014 normal ,  event monitor 2018 showedST w/ mild ST no arrhythmia,  ETT 02/ 2022 normal no ischemia, ef 67%   Wears contact lenses     Patient Active Problem List   Diagnosis Date Noted   Bipolar 1 disorder, mixed, moderate (HCC) 08/29/2018   OCD (obsessive compulsive disorder) 08/29/2018   MCI (mild cognitive impairment) 08/29/2018    Past Surgical History:  Procedure Laterality Date   COLONOSCOPY  2019   DIAGNOSTIC LAPAROSCOPY  1995   w/ fulgeration endometriosis   DILATATION & CURETTAGE/HYSTEROSCOPY WITH MYOSURE N/A 05/24/2021   Procedure:  Melton Krebs WITH ENDOMETRIAL SAMPLING;  Surgeon: Huel Cote, MD;  Location: [name];  Service: Gynecology;  Laterality: N/A;   ESOPHAGOGASTRODUODENOSCOPY  2008   FOOT SURGERY Bilateral 2016   approx---  bilateral 5th toe , removal bone    OB History   No obstetric history on file.      Home Medications    Prior to Admission medications   Medication Sig Start Date End Date Taking? Authorizing Provider  Acetylcysteine (NAC) 600 MG CAPS Take 1 capsule (600 mg total) by mouth daily. 09/13/20  Yes Cottle, Steva Ready., MD  atenolol (TENORMIN) 50 MG tablet Take 50 mg by mouth at bedtime.   Yes [provider]  B Complex Vitamins (VITAMIN B-COMPLEX) TABS Take 1 tablet by mouth at bedtime.   Yes [provider]  Calcium Carbonate-Vitamin D (CALCIUM-D PO) Take by mouth at bedtime.   Yes [provider]  Cholecalciferol (VITAMIN D3) 5000 units CAPS Take 1 capsule by mouth at bedtime.   Yes [provider]  Dapsone 7.5 % GEL Apply topically daily. face   Yes [provider]  diclofenac Sodium (VOLTAREN) 1 % GEL Apply topically 4 (four) times daily as needed.   Yes [provider]  doxepin (SINEQUAN) 10 MG capsule Take 1-3 capsules (10-30 mg total)  by mouth at bedtime as needed. 08/09/21  Yes Cottle, Steva Ready., MD  folic acid (FOLVITE) 400 MCG tablet Take 400 mcg by mouth at bedtime.   Yes [provider]  gabapentin (NEURONTIN) 600 MG tablet Take 600 mg by mouth at bedtime.   Yes [provider]  levothyroxine (SYNTHROID, LEVOTHROID) 112 MCG tablet Take 112 mcg by mouth at bedtime.   Yes [provider]  lidocaine (LIDODERM) 5 % Place 1 patch onto the skin as needed. Remove & Discard patch within 12 hours or as directed by MD   Yes [provider]  linaclotide (LINZESS) 290 MCG CAPS capsule Take 290 mcg by mouth at bedtime. 09/06/17  Yes [provider]  lithium carbonate 300  MG capsule Take 2 capsules (600 mg total) by mouth daily. 06/12/21  Yes Cottle, Steva Ready., MD  LORazepam (ATIVAN) 0.5 MG tablet Take 1-2 tablets (0.5-1 mg total) by mouth every 8 (eight) hours as needed for anxiety. 11/22/20  Yes Cottle, Steva Ready., MD  MAGNESIUM PO Take 1 tablet by mouth at bedtime.   Yes [provider]  memantine (NAMENDA) 10 MG tablet TAKE 1 TABLET TWICE A DAY 07/24/21  Yes Cottle, Steva Ready., MD  mirtazapine (REMERON) 30 MG tablet Take 30 mg by mouth at bedtime.   Yes [provider]  nitrofurantoin, macrocrystal-monohydrate, (MACROBID) 100 MG capsule Take 1 capsule (100 mg total) by mouth 2 (two) times daily for 7 days. 08/26/21 09/02/21 Yes [name], [name]  QUEtiapine (SEROQUEL) 200 MG tablet TAKE 1 TABLET AT BEDTIME 07/24/21  Yes Cottle, Steva Ready., MD  temazepam (RESTORIL) 15 MG capsule Take 15 mg by mouth at bedtime as needed. 01/01/17  Yes [provider]  tretinoin (RETIN-A) 0.025 % cream Apply topically at bedtime. face   Yes [provider]    Family History History reviewed. No pertinent family history.  Social History Social History   Tobacco Use   Smoking status: Former    Years: 5.00    Types: Cigarettes    Quit date: 2000    Years since quitting: 22.7   Smokeless tobacco: Never  Vaping Use   Vaping Use: Never used  Substance Use Topics   Alcohol use: Yes    Alcohol/week: 1.0 standard drink    Types: 1 Standard drinks or equivalent per week   Drug use: Never     Allergies   Patient has no known allergies.   Review of Systems Review of Systems  Genitourinary:  Positive for dysuria, hematuria and urgency.  All other systems reviewed and are negative.   Physical Exam Triage Vital Signs ED Triage Vitals  Enc Vitals Group     BP 08/26/21 1123 119/80     Pulse Rate 08/26/21 1123 87     Resp --      Temp 08/26/21 1123 99.6 F (37.6 C)     Temp Source 08/26/21 1123 Oral     SpO2 08/26/21 1123 99 %      Weight 08/26/21 1121 185 lb (83.9 kg)     Height 08/26/21 1121 5\' 6"  (1.676 m)     Head Circumference --      Peak Flow --      Pain Score 08/26/21 1120 8     Pain Loc --      Pain Edu? --      Excl. in GC? --    No data found.  Updated Vital Signs BP 119/80 (  BP Location: Right Arm)   Pulse 87   Temp 99.6 F (37.6 C) (Oral)   Ht 5\' 6"  (1.676 m)   Wt 185 lb (83.9 kg)   SpO2 99%   BMI 29.86 kg/m     Physical Exam Vitals and nursing note reviewed.  Constitutional:      General: She is not in acute distress.    Appearance: Normal appearance. She is obese. She is not ill-appearing.  HENT:     Head: Normocephalic and atraumatic.     Mouth/Throat:     Mouth: Mucous membranes are moist.     Pharynx: Oropharynx is clear.  Eyes:     Extraocular Movements: Extraocular movements intact.     Conjunctiva/sclera: Conjunctivae normal.     Pupils: Pupils are equal, round, and reactive to light.  Cardiovascular:     Rate and Rhythm: Normal rate and regular rhythm.     Pulses: Normal pulses.     Heart sounds: Normal heart sounds. No murmur heard. Pulmonary:     Effort: Pulmonary effort is normal.     Breath sounds: Normal breath sounds.     Comments: No adventitious breath sounds noted Musculoskeletal:        General: Normal range of motion.     Cervical back: Normal range of motion and neck supple.  Skin:    General: Skin is warm and dry.  Neurological:     General: No focal deficit present.     Mental Status: She is alert and oriented to person, place, and time. Mental status is at baseline.  Psychiatric:        Mood and Affect: Mood normal.        Behavior: Behavior normal.        Thought Content: Thought content normal.     UC Treatments / Results  Labs (all labs ordered are listed, but only abnormal results are displayed) Labs Reviewed  POCT URINALYSIS DIP (MANUAL ENTRY) - Abnormal; Notable for the following components:      Result Value   Ketones, POC UA  small (15) (*)    Spec Grav, UA >=1.030 (*)    Blood, UA large (*)    Protein Ur, POC >=300 (*)    Leukocytes, UA Small (1+) (*)    All other components within normal limits  URINE CULTURE    EKG   Radiology No results found.  Procedures Procedures (including critical care time)  Medications Ordered in UC Medications - No data to display  Initial Impression / Assessment and Plan / UC Course  I have reviewed the triage vital signs and the nursing notes.  Pertinent labs & imaging results that were available during my care of the patient were reviewed by me and considered in my medical decision making (see chart for details).     MDM: 1.  Acute cystitis with hematuria-Rx'd Macrobid. Advised patient to take medication as directed with food to completion.  Encourage patient increase daily water intake while taking this medication.  Advised patient we would follow-up with her once urine culture results are obtained.  Patient discharged home, hemodynamically stable. Final Clinical Impressions(s) / UC Diagnoses   Final diagnoses:  Acute cystitis with hematuria     Discharge Instructions      Advised patient to take medication as directed with food to completion.  Encourage patient increase daily water intake while taking this medication.  Advised patient we would follow-up with her once urine culture results are obtained.  ED Prescriptions     Medication Sig Dispense Auth. Provider   nitrofurantoin, macrocrystal-monohydrate, (MACROBID) 100 MG capsule Take 1 capsule (100 mg total) by mouth 2 (two) times daily for 7 days. 14 capsule [name], [name]      PDMP not reviewed this encounter.   [name], [name] 08/26/21 1159

## 2021-08-28 LAB — URINE CULTURE
MICRO NUMBER:: 12420234
SPECIMEN QUALITY:: ADEQUATE

## 2021-09-05 ENCOUNTER — Encounter: Payer: Self-pay | Admitting: Psychiatry

## 2021-09-05 ENCOUNTER — Telehealth (INDEPENDENT_AMBULATORY_CARE_PROVIDER_SITE_OTHER): Payer: Federal, State, Local not specified - PPO | Admitting: Psychiatry

## 2021-09-05 DIAGNOSIS — R7989 Other specified abnormal findings of blood chemistry: Secondary | ICD-10-CM

## 2021-09-05 DIAGNOSIS — F4000 Agoraphobia, unspecified: Secondary | ICD-10-CM | POA: Diagnosis not present

## 2021-09-05 DIAGNOSIS — F411 Generalized anxiety disorder: Secondary | ICD-10-CM | POA: Diagnosis not present

## 2021-09-05 DIAGNOSIS — Z91148 Patient's other noncompliance with medication regimen for other reason: Secondary | ICD-10-CM

## 2021-09-05 DIAGNOSIS — F3162 Bipolar disorder, current episode mixed, moderate: Secondary | ICD-10-CM | POA: Diagnosis not present

## 2021-09-05 DIAGNOSIS — F5105 Insomnia due to other mental disorder: Secondary | ICD-10-CM

## 2021-09-05 DIAGNOSIS — G3184 Mild cognitive impairment, so stated: Secondary | ICD-10-CM

## 2021-09-05 DIAGNOSIS — Z9114 Patient's other noncompliance with medication regimen: Secondary | ICD-10-CM

## 2021-09-05 DIAGNOSIS — F422 Mixed obsessional thoughts and acts: Secondary | ICD-10-CM | POA: Diagnosis not present

## 2021-09-05 DIAGNOSIS — Z79899 Other long term (current) drug therapy: Secondary | ICD-10-CM

## 2021-09-05 MED ORDER — ZALEPLON 10 MG PO CAPS
10.0000 mg | ORAL_CAPSULE | Freq: Every evening | ORAL | 1 refills | Status: DC | PRN
Start: 2021-09-05 — End: 2021-11-16

## 2021-09-05 MED ORDER — LITHIUM CARBONATE 300 MG PO CAPS: 600.0000 mg | ORAL_CAPSULE | Freq: Every day | ORAL | 0 refills | Status: DC

## 2021-09-05 MED ORDER — DOXEPIN HCL 25 MG PO CAPS: 50.0000 mg | ORAL_CAPSULE | Freq: Every evening | ORAL | 0 refills | Status: DC | PRN

## 2021-09-05 NOTE — Progress Notes (Signed)
Kristen Leon [id number] September 27, 1966 55 y.o.  Video Visit via My Chart  I connected with pt by My Chart and verified that I am speaking with the correct person using two identifiers.   I discussed the limitations, risks, security and privacy concerns of performing an evaluation and management service by My Chart  and the availability of in person appointments. I also discussed with the patient that there may be a patient responsible charge related to this service. The patient expressed understanding and agreed to proceed.  I discussed the assessment and treatment plan with the patient. The patient was provided an opportunity to ask questions and all were answered. The patient agreed with the plan and demonstrated an understanding of the instructions.   The patient was advised to call back or seek an in-person evaluation if the symptoms worsen or if the condition fails to improve as anticipated.  I provided 30 minutes of video time during this encounter.  The patient was located at home and the provider was located office. Session started 2:45 PM and ended 3:15 PM  Subjective:   Patient ID:  Kristen Leon is a 55 y.o. (DOB 11-20-1966) female.  Chief Complaint:  Chief Complaint  Patient presents with   Medication Reaction   Bipolar I disorder (HCC)   Stress   Medication Problem   Sleeping Problem     Medication Refill Pertinent negatives include no abdominal pain, chest pain or weakness.    Kristen Leon presents  today for follow-up ofBipolar disorder, generalized anxiety disorder, nocturnal panic attacks, history of OCD, and mild cognitive impairment and worsening driving anxiety.  She has a goal of getting off the Seroquel in hopes of improving memory.  at visit August 06, 2019.  We needed to start a mood stabilizer and discussed variety of options and decided to start with Latuda.  We started at 20 mg and the plan was to increase gradually to 80 mg daily.  If that was successful  this follow-up was going to lead to a potential reduction in an attempt to wean Seroquel. Never got up to 80 mg Latuda.    Did not noticed much of a difference.  visit September 03, 2019.  The following changes were made: Increase vitamin D from 5000 units daily to 10K units daily Start Latuda  80 mg daily Start reduction in Seroquel next week and reduce once every 3 weeks. Reduced Seroquel from 600 to 200 mg HS.  At first had sleep and mood problems and angry.  Added in  gabapentin 300 mg total week ago and sleep better.  Able to sleep on her own.  Mood is better also.  visit October 12, 2019.  The following changes were made: Increase vitamin D from 5000 units daily to 10K units daily Continue Latuda  80 mg daily Continue gradual reduction in Seroquel next week and reduce once every 3 to 4 weeks by 50 mg. Trying to get rid of gabapentin and Seroquel for cognitive reasons primarily  seen December 16, 2019.  The following was noted: Down to Seroquel 150 HS for 3 weeks.  Quality of sleepy slightly off.  How long on this dose?  Tends to wake 30 min earlier than usual.  Bought magnesium.   Feels fine in the day.  Slightly less harried and mentally cloudy with reduction in Seroquel. Likes the [place] as mood medication.   Overall mood and anxiety are under control.  Other concern is focus and memory. Wants to get off gabapentin  and Seroquel for that reason.  Feels the meds may increase risk of dementia.  Overall she has seen some cognitive improvement since the reduction in Seroquel as noted. She was in courage to try reducing the Seroquel further gradually.  She was encouraged to leave the gabapentin at 300 mg daily because it was helpful.  seen March 14, 2020.  She was not doing well.  The following was noted: Couldn't sleep without Seroquel 200 mg.  Felt unstable and anxious.  For weeks felt she still struggled at 200 mg Seroquel and raised the dose all the way back up to where she started bc felt  desperate and was on edge.   Early 2000's not on Seroquel and had a lot of anxiety and obsessive thought.  It helps anxiety. Mood better on Seroquel and feels better physically and mentally.  A little down with stress and failure of switch to Jordan.  little anger but not much depression and no other sx of mania.    Driving anxiety and sense unreality resolved.  Fine with driving now. The following changes were made: Continue vitamin D from 5000 units daily to 10K units daily Wean Latuda to 40 mg for a week and then stop it. Then start Saphris 5mg  HS and reduce Seroquel 400 mg at night for 5 days, then increase Saphris 10 mg at night and reduce Seroquel to 200 mg at night for 5 days, then increase Saphris to 15 HS and stop sEroquel.  04/12/2020 appointment the following is noted: As noted pt has been wanting off Seroquel so med changes last visit wre intended to help. More anxious with reduced gabapentin and Seroquel.  Hard to stop Seroquel bc both anxiety and insomnia and using 100 mg Seroquel.  Missed a day of work over it yesterday.  More sleep with Saphris than Latuda.  No SE with saphris. Anxiety included fidgety. Hasn't tried Saphris 15 mg daily yet.   Has felt in a little less foggy headed with reduced Seroquel and gabapentin.  Down on gabapentin for a while now and only a little change with that reduction.  A little better focus and memory now. Neuropsych testing next week. Plan: Increase Saphris 15 mg HS and stop Seroquel again.  Can reduce Seroquel to 50 or even lower if needed.    05/09/2020 phone call with the following noted: Patient had to go back up to 600 mg Seroquel at hs, she has tried the last few nights at 500 mg. She's been unsuccessful tapering down and just wanted to have 200 mg and 100 mg tablets available to try and get it lowered.   07/07/2020 appointment with the following noted: Got close to off Seroquel but felt untethered and trouble with sleep initial and terminal. 5-6  hours on low dose Seroquel. Never tried Saphris 15 mg for reasons that are unclear.  Stopped shortly after the phone session from last time. Started Seroquel by 2002 and gabapentin in 2003.  Finds it very difficult to get off the meds and worry over instability. Plan: Start Saphris 10 mg at night and reduce Seroquel to 400 mg nightly for 4 nights, Then increase Saphris to 15 mg at night and reduce Seroquel to 200 mg for 4 nights, Then reduce Seroquel to 100 mg and continue Saphris at 15 mg nightly for 4 nights, Then stop Seroquel and if insomnia occurs, increase Saphris to 20 mg at night.  07/19/2020 phone call.  Restless legs on Saphris.  Given instructions to change  to loxapine 100 mg nightly. 07/25/2020 phone call patient stating she wanted to try to continue Saphris but have treatment for the restless legs side effects. Prescription sent for ropinirole  09/13/2020 appointment with the following noted: Not taking Saphris.  At 10 mg HS CO jitteriness and anxiety the next day. Never took loxapine. Wonders about taking loxapine now.  Plan: Give her this change in instructions for transition from Seroquel to loxapine: Start loxapine 25 mg capsule 1 at night and reduce Seroquel to 400 mg nightly for 4 nights, Then increase loxapine to 2 capsules at night and reduce Seroquel to 200 mg for 4 nights, Then reduce Seroquel to 100 mg and increase loxapine to 3 capsules nightly for 4 nights, Then stop Seroquel and if insomnia occurs, increase loxapine to 4 capsules at night and notify MD.   Multiple phone calls since the last visit including the following. 10/24/2020:Telephone call to patient.  She experiences an increase in anxiety whenever she reduces the Seroquel.  She is just started the transition from Seroquel to loxapine.  She is tolerating the loxapine well.  Encouraged her to just use the lorazepam and transition from Seroquel to loxapine.  It is hoped and expected that the loxapine will  effectively manage anxiety as the Seroquel when she is on the full dose.  She agrees to the plan.  Lorazepam prescription sent in. 11/17/2020: RTC  See prior note.  Took 50 mg Seroquel last night.  Realized she made a mistake in dosing.  She stopped from 200 mg Seroquel instead of reducing to 100 mg as instructed.  Had gotten up to 4 loxapine without seroquel was jittery and like she'd come out of her skin and took lorazepam.  Still some problems with memory.  Last night took 50 mg Seroquel and was a little better. Much better today than yesterday.   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   Memory testing with visuospatial problems and was worked up over that.   Take 4 loxapine and start 75 mg Seroquel and taper from there more slowly. Appt 11/22/20  11/22/2020:Pt requesting Rx for Loxapine 25 mg 4/d  30 day and Seroquel 200 mg 3/d   30 day and Lorazepam 0.5 mg 1-2 tab every 8 hrs PRN for anxiety  @ [facility] [employer] on file. Apt RS from 12/21 to 2/9 and on canc list. Pt going out of town 12/27-1/3 asking for Rx before 12/27.  11/22/2020 MD response: I sent in the prescription for lorazepam.  Patient is not on quetiapine 200 mg 3 daily and I will not prescribe that dose.  Ask her what dose of quetiapine she is actually taking.  Also the insurance will not cover 4 loxapine capsules daily.  If she is taking 4 I will need to change it to 2 of the 50 mg capsules.  Please verify her current dose of loxapine and quetiapine.  I am weaning her off of quetiapine she should not be on that high of a dose of quetiapine. 11/23/2020:Pt called back stating she would like to stop Loxapine completely. She is having blurred vision and jittery. She did not pick up Loxapine Rx. She is currently taking quetiapine 100 mg (4-25 mg tabs daily). Requesting to go back to 600 mg daily. Requesting Rx for this.  Agreed 12/01/2020:[employer] called to request that you decrease her  gabapentin and prescribe Buspar.  Please call to discuss this.  appt 01/11/21.  MD  response: Took buspar years ago and wants to try it again and reduce gabapentin.  Currently on 1200 mg HS.  Reduce to 900 mg gabapentin.   Start buspirone 5 mg twice daily, and increase to 15 mg BID. Disc SE.  She agrees.  01/11/2021 appointment with the following noted: Completely off gabapentin for a couple of weeks maybe.  Initially anxiety a little high in AM.  Increased buspirone. Had increase anxiety when off Seroquel.  Was off about 4 days and memory was not better.  Anxiety is better now. Anxiety is a little higher off gabapentin but not unmanageable.  Sleep is not as good off the gabapentin.  More initial insomnia and takes melatonin to manage. Not marked difference with cognition off gabapentin. Read about antipsychotics reducing brain volume and still wants to stop Seroquel despite multiple failed attempts. Wants to switch from Seroquel to trazodone.  Plan: OK trial Seroquel reduction to 400 mg daily with no other med changes.  02/17/2021 appointment with the following noted: Stopped buspirone without change. Stopped gabapentin. Reduced Seroquel to 400 mg HS. She is convinced antipsychotic is causing brain loss.  She wants to be off antipsychotics bc of this concern and risk of dementia.  Don't think fears of dementia are unfounded. Doesn't believe she has psychotic sx. Wants to retry lithium and then trazodone in place of Seroquel.  Took lithium a long time.   Acknowledges prior failures weaning Seroquel multiple times but thinks maybe it was too fast.  Sleep most of the night and sleep less deep than 600 mg Seroquel.  Also less deep without gabapentin.  Altogether 6-7 hours but leaves the TV on.   In bed 8 watching TV and doesn't try to sleep until 1030. Plan: She  failed attempts to get off Seroquel multiple times. OK trial Seroquel reduction to 300 mg daily for 3-4 weeks, then 200 mg for 3-4  weeks. Disc withdrawal effects from Seroquel. Start lithium 300 mg nightly for a week then 600 mg nightly. Wait a week and get blood level.    04/12/2021 appointment with the following noted: Did get down to Seroquel 200 mg HS and up to lithium 600 HS and trazodone 100 mg HS. Sleep more interrupted but 6-7 hours with poor quality and less well rested with change.  Mood really good with lithium and maybe better. Often had rapid cycling and it seemed to stop. But "sleep sucks" and feels it affects her during the day Still takes mirtazapine 15 mg and added trazodone.  Sleep worse without trazodone. No SE with lithium. Cognition  more clear with better focus with less Seroquel.   Patient reports stable mood and denies depressed or irritable moods.  Patient denies any recent difficulty with anxiety, surprisingly. Denies appetite disturbance.  Patient reports that energy and motivation have been good.  Patient denies any difficulty with concentration.  Patient denies any suicidal ideation.  Plan: Continue Seroquel  200 mg for 3-4 weeks. Continue lithium 600 mg nightly. Check another level  Stop mirtazapine and trazodone Doxepin 10-30 mg HS   04/24/2021 phone call complaining of obsessive thinking and wanting medicine sent into the pharmacy. MD response: Her case is very complicated because she has complained of side effects from atypicals and worried about cognitive side effects of benzodiazepines.  She is bipolar and therefore we cannot use SSRIs.  She does not want to take atypical antipsychotics.  She is on a low dose of Seroquel now and I am sure does not  want to increase the dosage back up.  She has a as needed prescription for lorazepam she can use that.  I will not make any other major med changes over the phone outside of a scheduled appointment.  If the symptoms are bothersome enough however going to the cancellation list and we will attempt to get her into an appointment sooner.  06/01/2021  appointment with the following noted: Thinks she stopped trazodone and ? Took doxepin.  She vaguely think she tried it and it didn't help sleep. Mood more stable on lithium 600 HS.  Prior mood cycling into dep and mania have resolved on it.. Still problems with her sleep. Increased Seroquel to 400 mg on her own for sleep added gabapentin 600 and sleep is better but wants off both of them. Still on mirtazapine 30.   Cholecystectomy scheduled for August. Had stopped Seroquel and then had intrusive thoughts which have now resolved.  At the time she called they were bad. Plan: Continue lithium 600 mg nightly. Check another level before next appt Wants to try again to taper quetiapine, will proceed as follows: Stop mirtazapine DT NR Retry Doxepin at higher dose of 50 mg up to 100 mg HS Once sleeping well then try to taper Seroquel very slowly such as reduce by 50 mg every 2 to 3 weeks or even slower if necessary.  09/05/2021 appointment with the following noted: Got down to Seroquel 200 and taking gabapentin 600 mg HS. Never tried 50-100 mg for sleep. Off track for 6 weeks without enough sleep.  Cholecystectomy recently. On phone with BF too much at night talking to him bc of his work schedule.   More irritable and tired.   Older cousin moved in for 3 mos and stressed by that and esp bc he brought a dog.  He's taking advantage of her and brought his GF. Stopped lithium AMA bc "I was afraid of it".   I want to go back on lithium bc it helped and retry doxepin at higher dose and try wean Seroquel.  Then wean gabapenin.   Was more focused and better mood stability on lithium. Mirtazapine does not help sleep much. Paranoid about being hurt if she dates and no history of it.  Develop a whole story about what might happen.  When manic feels more reckless, spending, talk to herself, driven, reduced sleep, irritable and angry.  Past Psychiatric Medication Trials: Vraylar 3 SE, Abilify, brief Latuda  ? effect,  Depakote NR, carbamazepine questionable side effects, lamotrigine lost response, Trileptal,  lithium no response, clozapine, olanzapine , Seroquel 600, risperidone 1 mg twice daily  , Saphris CO anxiety mirtazapine,  Gabapentin cog SE,  Trazodone 200, doxepin 30 not sedating several SSRIs, clomipramine had vision side effects,   failed an attempt to switch from Seroquel to Vraylar early 2020  Failed switch from Seroquel to Jordan, Saphris, Vraylar and loxapine No psych hosp.  Review of Systems:  Review of Systems  Cardiovascular:  Negative for chest pain and palpitations.  Gastrointestinal:  Negative for abdominal pain.  Neurological:  Negative for dizziness, tremors, weakness and light-headedness.  Psychiatric/Behavioral:  Positive for decreased concentration and sleep disturbance. Negative for agitation, behavioral problems, confusion, dysphoric mood, hallucinations, self-injury and suicidal ideas. The patient is not nervous/anxious and is not hyperactive.    Medications: I have reviewed the patient's current medications.  Current Outpatient Medications  Medication Sig Dispense Refill   Acetylcysteine (NAC) 600 MG CAPS Take 1 capsule (600 mg total) by  mouth daily. 90 capsule 1   atenolol (TENORMIN) 50 MG tablet Take 50 mg by mouth at bedtime.     B Complex Vitamins (VITAMIN B-COMPLEX) TABS Take 1 tablet by mouth at bedtime.     Calcium Carbonate-Vitamin D (CALCIUM-D PO) Take by mouth at bedtime.     Cholecalciferol (VITAMIN D3) 5000 units CAPS Take 1 capsule by mouth at bedtime.     folic acid (FOLVITE) 400 MCG tablet Take 400 mcg by mouth at bedtime.     gabapentin (NEURONTIN) 600 MG tablet Take 300 mg by mouth at bedtime.     levothyroxine (SYNTHROID, LEVOTHROID) 112 MCG tablet Take 112 mcg by mouth at bedtime.     lidocaine (LIDODERM) 5 % Place 1 patch onto the skin as needed. Remove & Discard patch within 12 hours or as directed by MD     LORazepam (ATIVAN) 0.5 MG  tablet Take 1-2 tablets (0.5-1 mg total) by mouth every 8 (eight) hours as needed for anxiety. 50 tablet 0   MAGNESIUM PO Take 1 tablet by mouth at bedtime.     memantine (NAMENDA) 10 MG tablet TAKE 1 TABLET TWICE A DAY 180 tablet 0   QUEtiapine (SEROQUEL) 200 MG tablet TAKE 1 TABLET AT BEDTIME 90 tablet 0   doxepin (SINEQUAN) 25 MG capsule Take 2-3 capsules (50-75 mg total) by mouth at bedtime as needed. 180 capsule 0   linaclotide (LINZESS) 290 MCG CAPS capsule Take 290 mcg by mouth at bedtime. (Patient not taking: Reported on 09/05/2021)     lithium carbonate 300 MG capsule Take 2 capsules (600 mg total) by mouth daily. 180 capsule 0   tretinoin (RETIN-A) 0.025 % cream Apply topically at bedtime. face (Patient not taking: Reported on 09/05/2021)     zaleplon (SONATA) 10 MG capsule Take 1 capsule (10 mg total) by mouth at bedtime as needed for sleep. 30 capsule 1   No current facility-administered medications for this visit.    Medication Side Effects: None  Allergies: No Known Allergies  Past Medical History:  Diagnosis Date   Anxiety    Bipolar 1 disorder (HCC)    followed by dr c. cottle   Carpal tunnel syndrome on both sides    CKD (chronic kidney disease), stage II    followed by pcp   Endometriosis    Hypothyroidism, postablative    endocrinologist--- dr c. Dewitt Hoes--- dx toxic multinodular thyroid 2000 w/ hyperthyroidism s/p RAI  2000, 2002, and 2004;  post hypothryoid in 2008;   last bx 2012 left side, benign per pt   Irritable bowel syndrome with constipation    followed by dr w. Jason Fila ([facility])   MDD (major depressive disorder)    Multinodular thyroid    in 2000 dx toxic post ablative   NAFLD (nonalcoholic fatty liver disease)    OSA (obstructive sleep apnea)    05-18-2021  per pt study done approx. 2018, told moderat OSA,  does uses cpap   Tachycardia    cardiologist--- Guerry Bruin NP @ [facility] in [place],  w/ palpitations, controlled w/ atenolol;  pt has had work-up per note echo  2014 normal ,  event monitor 2018 showedST w/ mild ST no arrhythmia,  ETT 02/ 2022 normal no ischemia, ef 67%   Wears contact lenses     History reviewed. No pertinent family history.  Social History   Socioeconomic History   Marital status: Divorced    Spouse name: Not on file   Number of children: Not on  file   Years of education: Not on file   Highest education level: Not on file  Occupational History   Not on file  Tobacco Use   Smoking status: Former    Years: 5.00    Types: Cigarettes    Quit date: 2000    Years since quitting: 22.7   Smokeless tobacco: Never  Vaping Use   Vaping Use: Never used  Substance and Sexual Activity   Alcohol use: Yes    Alcohol/week: 1.0 standard drink    Types: 1 Standard drinks or equivalent per week   Drug use: Never   Sexual activity: Not on file  Other Topics Concern   Not on file  Social History Narrative   Not on file   Social Determinants of Health   Financial Resource Strain: Not on file  Food Insecurity: Not on file  Transportation Needs: Not on file  Physical Activity: Not on file  Stress: Not on file  Social Connections: Not on file  Intimate Partner Violence: Not on file    Past Medical History, Surgical history, Social history, and Family history were reviewed and updated as appropriate.   Please see review of systems for further details on the patient's review from today.   Objective:   Physical Exam:  There were no vitals taken for this visit.  Physical Exam Neurological:     Mental Status: She is alert and oriented to person, place, and time.     Cranial Nerves: No dysarthria.  Psychiatric:        Attention and Perception: She is inattentive.        Mood and Affect: Mood is anxious. Mood is not depressed.        Speech: Speech normal. Speech is not rapid and pressured or slurred.        Behavior: Behavior is cooperative.        Thought Content: Thought content is paranoid. Thought content is not  delusional. Thought content does not include homicidal or suicidal ideation. Thought content does not include homicidal or suicidal plan.        Cognition and Memory: She exhibits impaired recent memory. She does not exhibit impaired remote memory.        Judgment: Judgment normal.     Comments: Insight and judgment appear good. Denies mania symptoms  Mild paranoia and irritability and dysphoria    Lab Review:     Component Value Date/Time   NA 136 04/10/2021 1035   K 4.3 04/10/2021 1035   CL 97 04/10/2021 1035   CO2 25 04/10/2021 1035   GLUCOSE 77 04/10/2021 1035   BUN 13 04/10/2021 1035   CREATININE 0.94 04/10/2021 1035   CALCIUM 9.7 04/10/2021 1035       Component Value Date/Time   WBC 6.6 05/24/2021 0630   RBC 3.79 (L) 05/24/2021 0630   HGB 11.3 (L) 05/24/2021 0630   HCT 35.0 (L) 05/24/2021 0630   PLT 325 05/24/2021 0630   MCV 92.3 05/24/2021 0630   MCH 29.8 05/24/2021 0630   MCHC 32.3 05/24/2021 0630   RDW 13.2 05/24/2021 0630    Lithium Lvl  Date Value Ref Range Status  04/10/2021 0.8 0.5 - 1.2 mmol/L Final    Comment:    Plasma concentration of 0.5 - 0.8 mmol/L are advised for long-term use; concentrations of up to 1.2 mmol/L may be necessary during acute treatment.  Detection Limit = 0.1                           <0.1 indicates None Detected   04/10/2021 lithium level 0.8 on 600 mg daily.  No results found for: PHENYTOIN, PHENOBARB, VALPROATE, CBMZ   Took lab orders to PCP from visit in September and reports results: B12 >2000 Folate > 20 D 43.5 Iron TIBC 297 (250-450)  UIBC 156 (131-425)  Iron 141 (27-159)  Iron saturation 47 (15-55)   .res Assessment: Plan:    Bipolar 1 disorder, mixed, moderate (HCC) - Plan: lithium carbonate 300 MG capsule  Generalized anxiety disorder  Mixed obsessional thoughts and acts  Agoraphobia  MCI (mild cognitive impairment)  Insomnia due to mental condition - Plan: zaleplon  (SONATA) 10 MG capsule, doxepin (SINEQUAN) 25 MG capsule  Lithium use - Plan: lithium carbonate 300 MG capsule  Low vitamin D level  Noncompliance with medication treatment due to difficulty with dosing   Greater than 50% of 30-minute video time with patient was spent on counseling and coordination of care. We discussed nature of her psychiatric diagnoses .   She remains motivated to get off Seroquel because she believes is causing cognitive problems. Bipolar disorder Was worse with reduction in Seroquel until addtion of lithium has helped so far. Failed switch from Seroquel to Jordan, Saphris, Vraylar and loxapine   She then wanted to consider retrying lithium.  Chart says it failed but she says trial was 20 years ago. Counseled patient regarding potential benefits, risks, and side effects of lithium to include potential risk of lithium affecting thyroid and renal function.  Discussed need for periodic lab monitoring to determine drug level and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

## 2021-10-10 ENCOUNTER — Telehealth: Payer: Self-pay | Admitting: Psychiatry

## 2021-10-10 NOTE — Telephone Encounter (Signed)
Empty message

## 2021-10-12 ENCOUNTER — Telehealth: Payer: Self-pay | Admitting: Psychiatry

## 2021-10-12 DIAGNOSIS — F3162 Bipolar disorder, current episode mixed, moderate: Secondary | ICD-10-CM

## 2021-10-12 MED ORDER — QUETIAPINE FUMARATE 200 MG PO TABS: 200.0000 mg | ORAL_TABLET | Freq: Every day | ORAL | 0 refills | Status: DC

## 2021-10-12 MED ORDER — MEMANTINE HCL 10 MG PO TABS: 10.0000 mg | ORAL_TABLET | Freq: Two times a day (BID) | ORAL | 0 refills | Status: DC

## 2021-10-12 NOTE — Telephone Encounter (Signed)
Rx sent 

## 2021-10-12 NOTE — Telephone Encounter (Signed)
Kristen Leon called and said that [facility] contacted her about her Seroquel and Namenda. They need additional info from [place] and told her they could not get in touch with [place].

## 2021-11-16 ENCOUNTER — Encounter: Payer: Self-pay | Admitting: Psychiatry

## 2021-11-16 ENCOUNTER — Telehealth (INDEPENDENT_AMBULATORY_CARE_PROVIDER_SITE_OTHER): Payer: Federal, State, Local not specified - PPO | Admitting: Psychiatry

## 2021-11-16 ENCOUNTER — Other Ambulatory Visit: Payer: Self-pay | Admitting: Psychiatry

## 2021-11-16 DIAGNOSIS — F3162 Bipolar disorder, current episode mixed, moderate: Secondary | ICD-10-CM

## 2021-11-16 DIAGNOSIS — G3184 Mild cognitive impairment, so stated: Secondary | ICD-10-CM

## 2021-11-16 DIAGNOSIS — F411 Generalized anxiety disorder: Secondary | ICD-10-CM

## 2021-11-16 DIAGNOSIS — F422 Mixed obsessional thoughts and acts: Secondary | ICD-10-CM

## 2021-11-16 DIAGNOSIS — F5105 Insomnia due to other mental disorder: Secondary | ICD-10-CM

## 2021-11-16 DIAGNOSIS — Z79899 Other long term (current) drug therapy: Secondary | ICD-10-CM

## 2021-11-16 DIAGNOSIS — F4000 Agoraphobia, unspecified: Secondary | ICD-10-CM

## 2021-11-16 MED ORDER — ZALEPLON 10 MG PO CAPS: 10.0000 mg | ORAL_CAPSULE | Freq: Every evening | ORAL | 0 refills | Status: DC | PRN

## 2021-11-16 MED ORDER — QUETIAPINE FUMARATE 25 MG PO TABS: 75.0000 mg | ORAL_TABLET | Freq: Every day | ORAL | 0 refills | Status: DC

## 2021-11-16 NOTE — Progress Notes (Signed)
Kristen Leon [id number] 02-04-1966 55 y.o.  Video Visit via My Chart  I connected with pt by My Chart and verified that I am speaking with the correct person using two identifiers.   I discussed the limitations, risks, security and privacy concerns of performing an evaluation and management service by My Chart  and the availability of in person appointments. I also discussed with the patient that there may be a patient responsible charge related to this service. The patient expressed understanding and agreed to proceed.  I discussed the assessment and treatment plan with the patient. The patient was provided an opportunity to ask questions and all were answered. The patient agreed with the plan and demonstrated an understanding of the instructions.   The patient was advised to call back or seek an in-person evaluation if the symptoms worsen or if the condition fails to improve as anticipated.  I provided 30 minutes of video time during this encounter.  The patient was located at home and the provider was located office. Session started 1030 until 1100  Subjective:   Patient ID:  Kristen Leon is a 55 y.o. (DOB 11/08/1966) female.  Chief Complaint:  Chief Complaint  Patient presents with   Follow-up     Medication Refill Pertinent negatives include no weakness.    Kristen Leon presents  today for follow-up ofBipolar disorder, generalized anxiety disorder, nocturnal panic attacks, history of OCD, and mild cognitive impairment and worsening driving anxiety.  She has a goal of getting off the Seroquel in hopes of improving memory.  at visit August 06, 2019.  We needed to start a mood stabilizer and discussed variety of options and decided to start with Latuda.  We started at 20 mg and the plan was to increase gradually to 80 mg daily.  If that was successful this follow-up was going to lead to a potential reduction in an attempt to wean Seroquel. Never got up to 80 mg Latuda.    Did  not noticed much of a difference.  visit September 03, 2019.  The following changes were made: Increase vitamin D from 5000 units daily to 10K units daily Start Latuda  80 mg daily Start reduction in Seroquel next week and reduce once every 3 weeks. Reduced Seroquel from 600 to 200 mg HS.  At first had sleep and mood problems and angry.  Added in  gabapentin 300 mg total week ago and sleep better.  Able to sleep on her own.  Mood is better also.  visit October 12, 2019.  The following changes were made: Increase vitamin D from 5000 units daily to 10K units daily Continue Latuda  80 mg daily Continue gradual reduction in Seroquel next week and reduce once every 3 to 4 weeks by 50 mg. Trying to get rid of gabapentin and Seroquel for cognitive reasons primarily  seen December 16, 2019.  The following was noted: Down to Seroquel 150 HS for 3 weeks.  Quality of sleepy slightly off.  How long on this dose?  Tends to wake 30 min earlier than usual.  Bought magnesium.   Feels fine in the day.  Slightly less harried and mentally cloudy with reduction in Seroquel. Likes the [place] as mood medication.   Overall mood and anxiety are under control.  Other concern is focus and memory. Wants to get off gabapentin and Seroquel for that reason.  Feels the meds may increase risk of dementia.  Overall she has seen some cognitive improvement since the reduction in  Seroquel as noted. She was in courage to try reducing the Seroquel further gradually.  She was encouraged to leave the gabapentin at 300 mg daily because it was helpful.  seen March 14, 2020.  She was not doing well.  The following was noted: Couldn't sleep without Seroquel 200 mg.  Felt unstable and anxious.  For weeks felt she still struggled at 200 mg Seroquel and raised the dose all the way back up to where she started bc felt desperate and was on edge.   Early 2000's not on Seroquel and had a lot of anxiety and obsessive thought.  It helps  anxiety. Mood better on Seroquel and feels better physically and mentally.  A little down with stress and failure of switch to [place].  little anger but not much depression and no other sx of mania.    Driving anxiety and sense unreality resolved.  Fine with driving now. The following changes were made: Continue vitamin D from 5000 units daily to 10K units daily Wean Latuda to 40 mg for a week and then stop it. Then start Saphris 5mg  HS and reduce Seroquel 400 mg at night for 5 days, then increase Saphris 10 mg at night and reduce Seroquel to 200 mg at night for 5 days, then increase Saphris to 15 HS and stop sEroquel.  04/12/2020 appointment the following is noted: As noted pt has been wanting off Seroquel so med changes last visit wre intended to help. More anxious with reduced gabapentin and Seroquel.  Hard to stop Seroquel bc both anxiety and insomnia and using 100 mg Seroquel.  Missed a day of work over it yesterday.  More sleep with Saphris than Latuda.  No SE with saphris. Anxiety included fidgety. Hasn't tried Saphris 15 mg daily yet.   Has felt in a little less foggy headed with reduced Seroquel and gabapentin.  Down on gabapentin for a while now and only a little change with that reduction.  A little better focus and memory now. Neuropsych testing next week. Plan: Increase Saphris 15 mg HS and stop Seroquel again.  Can reduce Seroquel to 50 or even lower if needed.    05/09/2020 phone call with the following noted: Patient had to go back up to 600 mg Seroquel at hs, she has tried the last few nights at 500 mg. She's been unsuccessful tapering down and just wanted to have 200 mg and 100 mg tablets available to try and get it lowered.   07/07/2020 appointment with the following noted: Got close to off Seroquel but felt untethered and trouble with sleep initial and terminal. 5-6 hours on low dose Seroquel. Never tried Saphris 15 mg for reasons that are unclear.  Stopped shortly after the phone  session from last time. Started Seroquel by 2002 and gabapentin in 2003.  Finds it very difficult to get off the meds and worry over instability. Plan: Start Saphris 10 mg at night and reduce Seroquel to 400 mg nightly for 4 nights, Then increase Saphris to 15 mg at night and reduce Seroquel to 200 mg for 4 nights, Then reduce Seroquel to 100 mg and continue Saphris at 15 mg nightly for 4 nights, Then stop Seroquel and if insomnia occurs, increase Saphris to 20 mg at night.  07/19/2020 phone call.  Restless legs on Saphris.  Given instructions to change to loxapine 100 mg nightly. 07/25/2020 phone call patient stating she wanted to try to continue Saphris but have treatment for the restless legs side effects.  Prescription sent for ropinirole  09/13/2020 appointment with the following noted: Not taking Saphris.  At 10 mg HS CO jitteriness and anxiety the next day. Never took loxapine. Wonders about taking loxapine now.  Plan: Give her this change in instructions for transition from Seroquel to loxapine: Start loxapine 25 mg capsule 1 at night and reduce Seroquel to 400 mg nightly for 4 nights, Then increase loxapine to 2 capsules at night and reduce Seroquel to 200 mg for 4 nights, Then reduce Seroquel to 100 mg and increase loxapine to 3 capsules nightly for 4 nights, Then stop Seroquel and if insomnia occurs, increase loxapine to 4 capsules at night and notify MD.   Multiple phone calls since the last visit including the following. 10/24/2020:Telephone call to patient.  She experiences an increase in anxiety whenever she reduces the Seroquel.  She is just started the transition from Seroquel to loxapine.  She is tolerating the loxapine well.  Encouraged her to just use the lorazepam and transition from Seroquel to loxapine.  It is hoped and expected that the loxapine will effectively manage anxiety as the Seroquel when she is on the full dose.  She agrees to the plan.  Lorazepam prescription  sent in. 11/17/2020: RTC  See prior note.  Took 50 mg Seroquel last night.  Realized she made a mistake in dosing.  She stopped from 200 mg Seroquel instead of reducing to 100 mg as instructed.  Had gotten up to 4 loxapine without seroquel was jittery and like she'd come out of her skin and took lorazepam.  Still some problems with memory.  Last night took 50 mg Seroquel and was a little better. Much better today than yesterday.   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   Memory testing with visuospatial problems and was worked up over that.   Take 4 loxapine and start 75 mg Seroquel and taper from there more slowly. Appt 11/22/20  11/22/2020:Pt requesting Rx for Loxapine 25 mg 4/d  30 day and Seroquel 200 mg 3/d   30 day and Lorazepam 0.5 mg 1-2 tab every 8 hrs PRN for anxiety  @ [place] on file. Apt RS from 12/21 to 2/9 and on canc list. Pt going out of town 12/27-1/3 asking for Rx before 12/27.  11/22/2020 MD response: I sent in the prescription for lorazepam.  Patient is not on quetiapine 200 mg 3 daily and I will not prescribe that dose.  Ask her what dose of quetiapine she is actually taking.  Also the insurance will not cover 4 loxapine capsules daily.  If she is taking 4 I will need to change it to 2 of the 50 mg capsules.  Please verify her current dose of loxapine and quetiapine.  I am weaning her off of quetiapine she should not be on that high of a dose of quetiapine. 11/23/2020:Pt called back stating she would like to stop Loxapine completely. She is having blurred vision and jittery. She did not pick up Loxapine Rx. She is currently taking quetiapine 100 mg (4-25 mg tabs daily). Requesting to go back to 600 mg daily. Requesting Rx for this.  Agreed 12/01/2020:Meri called to request that you decrease her gabapentin and prescribe Buspar.  Please call to discuss this.  appt 01/11/21.  MD response: Took buspar years ago and wants to  try it again and reduce gabapentin.  Currently on 1200 mg HS.  Reduce to 900 mg gabapentin.  Start buspirone 5 mg twice daily, and increase to 15 mg BID. Disc SE.  She agrees.  01/11/2021 appointment with the following noted: Completely off gabapentin for a couple of weeks maybe.  Initially anxiety a little high in AM.  Increased buspirone. Had increase anxiety when off Seroquel.  Was off about 4 days and memory was not better.  Anxiety is better now. Anxiety is a little higher off gabapentin but not unmanageable.  Sleep is not as good off the gabapentin.  More initial insomnia and takes melatonin to manage. Not marked difference with cognition off gabapentin. Read about antipsychotics reducing brain volume and still wants to stop Seroquel despite multiple failed attempts. Wants to switch from Seroquel to trazodone.  Plan: OK trial Seroquel reduction to 400 mg daily with no other med changes.  02/17/2021 appointment with the following noted: Stopped buspirone without change. Stopped gabapentin. Reduced Seroquel to 400 mg HS. She is convinced antipsychotic is causing brain loss.  She wants to be off antipsychotics bc of this concern and risk of dementia.  Don't think fears of dementia are unfounded. Doesn't believe she has psychotic sx. Wants to retry lithium and then trazodone in place of Seroquel.  Took lithium a long time.   Acknowledges prior failures weaning Seroquel multiple times but thinks maybe it was too fast.  Sleep most of the night and sleep less deep than 600 mg Seroquel.  Also less deep without gabapentin.  Altogether 6-7 hours but leaves the TV on.   In bed 8 watching TV and doesn't try to sleep until 1030. Plan: She  failed attempts to get off Seroquel multiple times. OK trial Seroquel reduction to 300 mg daily for 3-4 weeks, then 200 mg for 3-4 weeks. Disc withdrawal effects from Seroquel. Start lithium 300 mg nightly for a week then 600 mg nightly. Wait a week and get  blood level.    04/12/2021 appointment with the following noted: Did get down to Seroquel 200 mg HS and up to lithium 600 HS and trazodone 100 mg HS. Sleep more interrupted but 6-7 hours with poor quality and less well rested with change.  Mood really good with lithium and maybe better. Often had rapid cycling and it seemed to stop. But "sleep sucks" and feels it affects her during the day Still takes mirtazapine 15 mg and added trazodone.  Sleep worse without trazodone. No SE with lithium. Cognition  more clear with better focus with less Seroquel.   Patient reports stable mood and denies depressed or irritable moods.  Patient denies any recent difficulty with anxiety, surprisingly. Denies appetite disturbance.  Patient reports that energy and motivation have been good.  Patient denies any difficulty with concentration.  Patient denies any suicidal ideation.  Plan: Continue Seroquel  200 mg for 3-4 weeks. Continue lithium 600 mg nightly. Check another level  Stop mirtazapine and trazodone Doxepin 10-30 mg HS   04/24/2021 phone call complaining of obsessive thinking and wanting medicine sent into the pharmacy. MD response: Her case is very complicated because she has complained of side effects from atypicals and worried about cognitive side effects of benzodiazepines.  She is bipolar and therefore we cannot use SSRIs.  She does not want to take atypical antipsychotics.  She is on a low dose of Seroquel now and I am sure does not want to increase the dosage back up.  She has a as needed prescription for lorazepam she can use that.  I will not make any other major  med changes over the phone outside of a scheduled appointment.  If the symptoms are bothersome enough however going to the cancellation list and we will attempt to get her into an appointment sooner.  06/01/2021 appointment with the following noted: Thinks she stopped trazodone and ? Took doxepin.  She vaguely think she tried it and it  didn't help sleep. Mood more stable on lithium 600 HS.  Prior mood cycling into dep and mania have resolved on it.. Still problems with her sleep. Increased Seroquel to 400 mg on her own for sleep added gabapentin 600 and sleep is better but wants off both of them. Still on mirtazapine 30.   Cholecystectomy scheduled for August. Had stopped Seroquel and then had intrusive thoughts which have now resolved.  At the time she called they were bad. Plan: Continue lithium 600 mg nightly. Check another level before next appt Wants to try again to taper quetiapine, will proceed as follows: Stop mirtazapine DT NR Retry Doxepin at higher dose of 50 mg up to 100 mg HS Once sleeping well then try to taper Seroquel very slowly such as reduce by 50 mg every 2 to 3 weeks or even slower if necessary.  09/05/2021 appointment with the following noted: Got down to Seroquel 200 and taking gabapentin 600 mg HS. Never tried 50-100 mg for sleep. Off track for 6 weeks without enough sleep.  Cholecystectomy recently. On phone with BF too much at night talking to him bc of his work schedule.   More irritable and tired.   Older cousin moved in for 3 mos and stressed by that and esp bc he brought a dog.  He's taking advantage of her and brought his GF. Stopped lithium AMA bc "I was afraid of it".   I want to go back on lithium bc it helped and retry doxepin at higher dose and try wean Seroquel.  Then wean gabapenin.   Was more focused and better mood stability on lithium. Mirtazapine does not help sleep much. Paranoid about being hurt if she dates and no history of it.  Develop a whole story about what might happen. Plan: Stop mirtazapine DT NR Retry Doxepin at higher dose of 50 mg up to 100 mg HS Once sleeping well then try to taper Seroquel very slowly such as reduce by 50 mg every 2 to 3 weeks or even slower if necessary. Restart lithium 600 mg nightly. Check another level before next appt Don't stop meds AMA.    Call if there's a problem.  11/16/21 appt noted: Second week of Seroquel 150 mg daily. Is sleeping currently but not the same quality but not terrible.  On doxepin 75 mg HS Mood is good.  Likes the lithium better than Seroquel for mood.  Fewer hypomanic episones and more stable with less anger. Never got lithium level.  Says she'll do so. Rare lorazepam. No SE right now. Asks about Ozempic.  When manic feels more reckless, spending, talk to herself, driven, reduced sleep, irritable and angry.  Past Psychiatric Medication Trials: Vraylar 3 SE, Abilify, brief Latuda ? effect,  Depakote NR, carbamazepine questionable side effects, lamotrigine lost response, Trileptal,  lithium no response, clozapine, olanzapine , Seroquel 600, risperidone 1 mg twice daily  , Saphris CO anxiety mirtazapine,  Gabapentin cog SE,  Trazodone 200, doxepin 30 not sedating several SSRIs, clomipramine had vision side effects,   failed an attempt to switch from Seroquel to Vraylar early 2020  Failed switch from Seroquel to [place], Saphris,  Vraylar and loxapine No psych hosp.  Review of Systems:  Review of Systems  Cardiovascular:  Negative for palpitations.  Neurological:  Negative for dizziness, tremors, weakness and light-headedness.  Psychiatric/Behavioral:  Positive for decreased concentration and sleep disturbance. Negative for agitation, behavioral problems, confusion, dysphoric mood, hallucinations, self-injury and suicidal ideas. The patient is not nervous/anxious and is not hyperactive.    Medications: I have reviewed the patient's current medications.  Current Outpatient Medications  Medication Sig Dispense Refill   Acetylcysteine (NAC) 600 MG CAPS Take 1 capsule (600 mg total) by mouth daily. 90 capsule 1   atenolol (TENORMIN) 50 MG tablet Take 50 mg by mouth at bedtime.     B Complex Vitamins (VITAMIN B-COMPLEX) TABS Take 1 tablet by mouth at bedtime.     Calcium Carbonate-Vitamin D (CALCIUM-D  PO) Take by mouth at bedtime.     Cholecalciferol (VITAMIN D3) 5000 units CAPS Take 1 capsule by mouth at bedtime.     doxepin (SINEQUAN) 25 MG capsule Take 2-3 capsules (50-75 mg total) by mouth at bedtime as needed. 99991111 capsule 0   folic acid (FOLVITE) A999333 MCG tablet Take 400 mcg by mouth at bedtime.     gabapentin (NEURONTIN) 600 MG tablet Take 600 mg by mouth at bedtime.     levothyroxine (SYNTHROID, LEVOTHROID) 112 MCG tablet Take 112 mcg by mouth at bedtime.     linaclotide (LINZESS) 290 MCG CAPS capsule Take 290 mcg by mouth at bedtime.     lithium carbonate 300 MG capsule Take 2 capsules (600 mg total) by mouth daily. 180 capsule 0   LORazepam (ATIVAN) 0.5 MG tablet Take 1-2 tablets (0.5-1 mg total) by mouth every 8 (eight) hours as needed for anxiety. 50 tablet 0   MAGNESIUM PO Take 400 mg by mouth at bedtime.     memantine (NAMENDA) 10 MG tablet Take 1 tablet (10 mg total) by mouth 2 (two) times daily. 180 tablet 0   QUEtiapine (SEROQUEL) 200 MG tablet Take 1 tablet (200 mg total) by mouth at bedtime. (Patient taking differently: Take 200 mg by mouth at bedtime. Taking 150 mg) 60 tablet 0   QUEtiapine (SEROQUEL) 25 MG tablet Take 3 tablets (75 mg total) by mouth at bedtime. 90 tablet 0   tretinoin (RETIN-A) 0.025 % cream Apply topically at bedtime. face     zaleplon (SONATA) 10 MG capsule Take 1 capsule (10 mg total) by mouth at bedtime as needed for sleep. 90 capsule 0   No current facility-administered medications for this visit.    Medication Side Effects: None  Allergies: No Known Allergies  Past Medical History:  Diagnosis Date   Anxiety    Bipolar 1 disorder ([place])    followed by dr c. cottle   Carpal tunnel syndrome on both sides    CKD (chronic kidney disease), stage II    followed by pcp   Endometriosis    Hypothyroidism, postablative    endocrinologist--- dr c. Barnet Pall--- dx toxic multinodular thyroid 2000 w/ hyperthyroidism s/p RAI  2000, 2002, and 2004;  post  hypothryoid in 2008;   last bx 2012 left side, benign per pt   Irritable bowel syndrome with constipation    followed by dr w. Glennon Hamilton ([facility] Bruns)   MDD (major depressive disorder)    Multinodular thyroid    in 2000 dx toxic post ablative   NAFLD (nonalcoholic fatty liver disease)    OSA (obstructive sleep apnea)    05-18-2021  per pt study done approx.  2018, told moderat OSA,  does uses cpap   Tachycardia    cardiologist--- Sigurd Sos NP @ [facility] in [place],  w/ palpitations, controlled w/ atenolol;  pt has had work-up per note echo 2014 normal ,  event monitor 2018 showedST w/ mild ST no arrhythmia,  ETT 02/ 2022 normal no ischemia, ef 67%   Wears contact lenses     History reviewed. No pertinent family history.  Social History   Socioeconomic History   Marital status: Divorced    Spouse name: Not on file   Number of children: Not on file   Years of education: Not on file   Highest education level: Not on file  Occupational History   Not on file  Tobacco Use   Smoking status: Former    Years: 5.00    Types: Cigarettes    Quit date: 2000    Years since quitting: 22.9   Smokeless tobacco: Never  Vaping Use   Vaping Use: Never used  Substance and Sexual Activity   Alcohol use: Yes    Alcohol/week: 1.0 standard drink    Types: 1 Standard drinks or equivalent per week   Drug use: Never   Sexual activity: Not on file  Other Topics Concern   Not on file  Social History Narrative   Not on file   Social Determinants of Health   Financial Resource Strain: Not on file  Food Insecurity: Not on file  Transportation Needs: Not on file  Physical Activity: Not on file  Stress: Not on file  Social Connections: Not on file  Intimate Partner Violence: Not on file    Past Medical History, Surgical history, Social history, and Family history were reviewed and updated as appropriate.   Please see review of systems for further details on the patient's review from today.   Objective:    Physical Exam:  There were no vitals taken for this visit.  Physical Exam Neurological:     Mental Status: She is alert and oriented to person, place, and time.     Cranial Nerves: No dysarthria.  Psychiatric:        Attention and Perception: She is inattentive.        Mood and Affect: Mood is not anxious or depressed.        Speech: Speech normal. Speech is not rapid and pressured or slurred.        Behavior: Behavior is cooperative.        Thought Content: Thought content is not paranoid or delusional. Thought content does not include homicidal or suicidal ideation. Thought content does not include suicidal plan.        Cognition and Memory: She exhibits impaired recent memory. She does not exhibit impaired remote memory.        Judgment: Judgment normal.     Comments: Insight and judgment appear good. Denies mania symptoms  Better mood stability back on lithium 600 mg daily    Lab Review:     Component Value Date/Time   NA 136 04/10/2021 1035   K 4.3 04/10/2021 1035   CL 97 04/10/2021 1035   CO2 25 04/10/2021 1035   GLUCOSE 77 04/10/2021 1035   BUN 13 04/10/2021 1035   CREATININE 0.94 04/10/2021 1035   CALCIUM 9.7 04/10/2021 1035       Component Value Date/Time   WBC 6.6 05/24/2021 0630   RBC 3.79 (L) 05/24/2021 0630   HGB 11.3 (L) 05/24/2021 0630   HCT 35.0 (L) 05/24/2021  0630   PLT 325 05/24/2021 0630   MCV 92.3 05/24/2021 0630   MCH 29.8 05/24/2021 0630   MCHC 32.3 05/24/2021 0630   RDW 13.2 05/24/2021 0630    Lithium Lvl  Date Value Ref Range Status  04/10/2021 0.8 0.5 - 1.2 mmol/L Final    Comment:    Plasma concentration of 0.5 - 0.8 mmol/L are advised for long-term use; concentrations of up to 1.2 mmol/L may be necessary during acute treatment.                                  Detection Limit = 0.1                           <0.1 indicates None Detected   04/10/2021 lithium level 0.8 on 600 mg daily.  No results found for: PHENYTOIN, PHENOBARB,  VALPROATE, CBMZ   Took lab orders to PCP from visit in September and reports results: B12 >2000 Folate > 20 D 43.5 Iron TIBC 297 (250-450)  UIBC 156 (131-425)  Iron 141 (27-159)  Iron saturation 47 (15-55)   .res Assessment: Plan:    Bipolar 1 disorder, mixed, moderate ([place]) - Plan: Lithium level  Generalized anxiety disorder  Mixed obsessional thoughts and acts  MCI (mild cognitive impairment)  Insomnia due to mental condition - Plan: zaleplon (SONATA) 10 MG capsule  Agoraphobia  Lithium use   Greater than 50% of 30-minute video time with patient was spent on counseling and coordination of care. We discussed nature of her psychiatric diagnoses .   She remains motivated to get off Seroquel because she believes is causing cognitive problems.  Bipolar disorder Was worse with reduction in Seroquel until addtion of lithium has helped so far.  She feels lithium has provided better mood stability than does Seroquel. Failed switch from Seroquel to [place], [place], Vraylar and loxapine   Counseled patient regarding potential benefits, risks, and side effects of lithium to include potential risk of lithium affecting thyroid and renal function.  Discussed need for periodic lab monitoring to determine drug level and to assess for potential adverse effects.  Counseled patient regarding signs and symptoms of lithium toxicity and advised that they notify office immediately or seek urgent medical attention if experiencing these signs and symptoms.  Patient advised to contact office with any questions or concerns.  continue Doxepin at higher dose of 50 mg up to 100 mg HS Once sleeping well then try to taper Seroquel very slowly such as reduce by 50 mg every 2 to 3 weeks or even slower if necessary.  Continue lithium 600 mg nightly. Get lithium level ASAP.  Then repeat lithium level with every 20 # of weight loss Don't stop meds AMA.   Call if there's a problem.  Sleep hygiene incl no bed  without sleep.  Sleep options Belsomra, Dayvigo, doxepin, Rozerem, hydroxyzine.  Prefer avoid BZ and bc cognitive concerns she has. Wants Sonata rarely.  Continue memantine 10 BID off label for cognitive problems.  OK prn lorazepam.  We discussed the short-term risks associated with benzodiazepines including sedation and increased fall risk among others.  Discussed long-term side effect risk including dependence, potential withdrawal symptoms, and the potential eventual dose-related risk of dementia.  But recent studies from 2020 dispute this association between benzodiazepines and dementia risk. Newer studies in 2020 do not support an association with dementia.  Discussed  potential metabolic side effects associated with atypical antipsychotics, as well as potential risk for movement side effects. Advised pt to contact office if movement side effects occur.   Disc cognitive risks at length with meds and risks of untreated bipolar disorder for cognition as well.  She is taking Namenda 10 mg twice daily off label for help with this symptom. Disc the off-label use of N-Acetylcysteine at 600 mg daily to help with mild cognitive problems.  It can be combined with a B-complex vitamin as the B-12 and folate have been shown to sometimes enhance the effect.  availability problems with NAC have resolved.  She took it in the past.  Thyroid was normal recently. B12 and folate are normal D is OK but goal with her should be 50s-60s Disc reasons in detail including Dr. Missy Sabins recommendation. .  Disc neuroprotective literature on lithium. Dr. Olene Floss article  Greater than 50% of 30-minutevideo time with patient was spent on counseling and coordination of care.   FU 8 weeks  Lynder Parents, MD, DFAPA   Please see After Visit Summary for patient specific instructions.  No future appointments.    Orders Placed This Encounter  Procedures   Lithium level       -------------------------------

## 2021-11-16 NOTE — Patient Instructions (Addendum)
Get lithium level ASAP.  Then repeat lithium level with every 20 # of weight loss

## 2021-12-01 ENCOUNTER — Emergency Department
Admission: RE | Admit: 2021-12-01 | Discharge: 2021-12-01 | Disposition: A | Discharge status: Home / Self Care | Payer: Federal, State, Local not specified - PPO | Source: Ambulatory Visit

## 2021-12-01 ENCOUNTER — Other Ambulatory Visit: Payer: Self-pay

## 2021-12-01 ENCOUNTER — Emergency Department (INDEPENDENT_AMBULATORY_CARE_PROVIDER_SITE_OTHER): Payer: Federal, State, Local not specified - PPO

## 2021-12-01 VITALS — BP 120/85 | HR 95 | Temp 100.0°F | Resp 18 | Ht 66.0 in | Wt 185.0 lb

## 2021-12-01 DIAGNOSIS — R059 Cough, unspecified: Secondary | ICD-10-CM

## 2021-12-01 LAB — POC SARS CORONAVIRUS 2 AG -  ED: SARS Coronavirus 2 Ag: NEGATIVE

## 2021-12-01 LAB — POCT INFLUENZA A/B
Influenza A, POC: NEGATIVE
Influenza B, POC: NEGATIVE

## 2021-12-01 MED ORDER — PREDNISONE 50 MG PO TABS: ORAL_TABLET | ORAL | 0 refills | Status: DC

## 2021-12-01 NOTE — ED Provider Notes (Signed)
[name]    CSN: [id number] Arrival date & time: 12/01/21  1200      History   Chief Complaint Chief Complaint  Patient presents with   Cough    HPI Kristen Leon is a 55 y.o. female.   HPI 55 year old female presents with cough and congestion for 2 days.  Reports having COVID by valent booster vaccine 3 months ago.  Reports history of pneumonia.  PMH significant for CKD stage II.  Patient request that chest x-ray be performed today.  Past Medical History:  Diagnosis Date   Anxiety    Bipolar 1 disorder (HCC)    followed by dr c. cottle   Carpal tunnel syndrome on both sides    CKD (chronic kidney disease), stage II    followed by pcp   Endometriosis    Hypothyroidism, postablative    endocrinologist--- dr c. Dewitt Hoes--- dx toxic multinodular thyroid 2000 w/ hyperthyroidism s/p RAI  2000, 2002, and 2004;  post hypothryoid in 2008;   last bx 2012 left side, benign per pt   Irritable bowel syndrome with constipation    followed by dr w. Jason Fila ([facility])   MDD (major depressive disorder)    Multinodular thyroid    in 2000 dx toxic post ablative   NAFLD (nonalcoholic fatty liver disease)    OSA (obstructive sleep apnea)    05-18-2021  per pt study done approx. 2018, told moderat OSA,  does uses cpap   Tachycardia    cardiologist--- Guerry Bruin NP @ [facility] in [place],  w/ palpitations, controlled w/ atenolol;  pt has had work-up per note echo 2014 normal ,  event monitor 2018 showedST w/ mild ST no arrhythmia,  ETT 02/ 2022 normal no ischemia, ef 67%   Wears contact lenses     Patient Active Problem List   Diagnosis Date Noted   Bipolar 1 disorder, mixed, moderate (HCC) 08/29/2018   OCD (obsessive compulsive disorder) 08/29/2018   MCI (mild cognitive impairment) 08/29/2018    Past Surgical History:  Procedure Laterality Date   COLONOSCOPY  2019   DIAGNOSTIC LAPAROSCOPY  1995   w/ fulgeration endometriosis   DILATATION & CURETTAGE/HYSTEROSCOPY WITH MYOSURE N/A  05/24/2021   Procedure: Melton Krebs WITH ENDOMETRIAL SAMPLING;  Surgeon: Huel Cote, MD;  Location: [facility];  Service: Gynecology;  Laterality: N/A;   ESOPHAGOGASTRODUODENOSCOPY  2008   FOOT SURGERY Bilateral 2016   approx---  bilateral 5th toe , removal bone    OB History   No obstetric history on file.      Home Medications    Prior to Admission medications   Medication Sig Start Date End Date Taking? Authorizing Provider  predniSONE (DELTASONE) 50 MG tablet Take 1 tab p.o. daily for 5 days. 12/01/21  Yes [name], [name]  Acetylcysteine (NAC) 600 MG CAPS Take 1 capsule (600 mg total) by mouth daily. 09/13/20   Cottle, Steva Ready., MD  atenolol (TENORMIN) 50 MG tablet Take 50 mg by mouth at bedtime.    [provider]  B Complex Vitamins (VITAMIN B-COMPLEX) TABS Take 1 tablet by mouth at bedtime.    [provider]  Calcium Carbonate-Vitamin D (CALCIUM-D PO) Take by mouth at bedtime.    [provider]  Cholecalciferol (VITAMIN D3) 5000 units CAPS Take 1 capsule by mouth at bedtime.    [provider]  doxepin (SINEQUAN) 25 MG capsule Take 2-3 capsules (50-75 mg total) by mouth at bedtime as needed. 09/05/21   Cottle,  Billey Co., MD  folic acid (FOLVITE) A999333 MCG tablet Take 400 mcg by mouth at bedtime.    [provider]  gabapentin (NEURONTIN) 600 MG tablet Take 600 mg by mouth at bedtime.    [provider]  levothyroxine (SYNTHROID, LEVOTHROID) 112 MCG tablet Take 112 mcg by mouth at bedtime.    [provider]  linaclotide (LINZESS) 290 MCG CAPS capsule Take 290 mcg by mouth at bedtime. 09/06/17   [provider]  lithium carbonate 300 MG capsule TAKE 2 CAPSULES DAILY 11/17/21   Cottle, Billey Co., MD  LORazepam (ATIVAN) 0.5 MG tablet Take 1-2 tablets (0.5-1 mg total) by mouth every 8 (eight) hours as needed for anxiety. 11/22/20   Cottle, Billey Co., MD  MAGNESIUM PO Take 400 mg  by mouth at bedtime.    [provider]  memantine (NAMENDA) 10 MG tablet Take 1 tablet (10 mg total) by mouth 2 (two) times daily. 10/12/21   Cottle, Billey Co., MD  QUEtiapine (SEROQUEL) 200 MG tablet Take 1 tablet (200 mg total) by mouth at bedtime. Patient taking differently: Take 200 mg by mouth at bedtime. Taking 150 mg 10/12/21   Cottle, Billey Co., MD  QUEtiapine (SEROQUEL) 25 MG tablet Take 3 tablets (75 mg total) by mouth at bedtime. 11/16/21   Cottle, Billey Co., MD  tretinoin (RETIN-A) 0.025 % cream Apply topically at bedtime. face    [provider]  zaleplon (SONATA) 10 MG capsule Take 1 capsule (10 mg total) by mouth at bedtime as needed for sleep. 11/16/21   Cottle, Billey Co., MD    Family History Family History  Problem Relation Age of Onset   Thyroid disease Mother    Diabetes Mother    Hypertension Mother     Social History Social History   Tobacco Use   Smoking status: Former    Years: 5.00    Types: Cigarettes    Quit date: 2000    Years since quitting: 23.0   Smokeless tobacco: Never  Vaping Use   Vaping Use: Never used  Substance Use Topics   Alcohol use: Yes    Alcohol/week: 1.0 standard drink    Types: 1 Standard drinks or equivalent per week   Drug use: Never     Allergies   Patient has no known allergies.   Review of Systems Review of Systems  Respiratory:  Positive for cough.   All other systems reviewed and are negative.   Physical Exam Triage Vital Signs ED Triage Vitals  Enc Vitals Group     BP 12/01/21 1238 120/85     Pulse Rate 12/01/21 1238 95     Resp 12/01/21 1238 18     Temp 12/01/21 1238 100 F (37.8 C)     Temp Source 12/01/21 1238 Oral     SpO2 12/01/21 1238 98 %     Weight 12/01/21 1241 185 lb (83.9 kg)     Height 12/01/21 1241 5\' 6"  ([id number] m)     Head Circumference --      Peak Flow --      Pain Score 12/01/21 1241 6     Pain Loc --      Pain Edu? --      Excl. in [place]? --    No data  found.  Updated Vital Signs BP 120/85 (BP Location: Left Arm)    Pulse 95    Temp 100 F (37.8 C) (Oral)  Resp 18    Ht 5\' 6"  (1.676 m)    Wt 185 lb (83.9 kg)    SpO2 98%    BMI 29.86 kg/m       Physical Exam Vitals and nursing note reviewed.  Constitutional:      General: She is not in acute distress.    Appearance: Normal appearance. She is obese. She is not ill-appearing.  HENT:     Head: Normocephalic and atraumatic.     Right Ear: Tympanic membrane, ear canal and external ear normal.     Left Ear: Tympanic membrane, ear canal and external ear normal.     Mouth/Throat:     Mouth: Mucous membranes are moist.     Pharynx: Oropharynx is clear.  Eyes:     Extraocular Movements: Extraocular movements intact.     Conjunctiva/sclera: Conjunctivae normal.     Pupils: Pupils are equal, round, and reactive to light.  Cardiovascular:     Rate and Rhythm: Normal rate and regular rhythm.     Pulses: Normal pulses.     Heart sounds: Normal heart sounds.  Pulmonary:     Effort: Pulmonary effort is normal.     Breath sounds: Normal breath sounds. No wheezing, rhonchi or rales.  Musculoskeletal:     Cervical back: Normal range of motion and neck supple.  Skin:    General: Skin is warm and dry.  Neurological:     General: No focal deficit present.     Mental Status: She is alert and oriented to person, place, and time.     UC Treatments / Results  Labs (all labs ordered are listed, but only abnormal results are displayed) Labs Reviewed  POCT INFLUENZA A/B  POC SARS CORONAVIRUS 2 AG -  ED    EKG   Radiology DG Chest 2 View  Result Date: 12/01/2021 CLINICAL DATA:  Cough. EXAM: CHEST - 2 VIEW COMPARISON:  None. FINDINGS: The heart size and mediastinal contours are within normal limits. Mild bibasilar subsegmental atelectasis is noted. The visualized skeletal structures are unremarkable. IMPRESSION: Mild bibasilar subsegmental atelectasis. Electronically Signed   By: Marijo Conception M.D.   On: 12/01/2021 13:29    Procedures Procedures (including critical care time)  Medications Ordered in UC Medications - No data to display  Initial Impression / Assessment and Plan / UC Course  I have reviewed the triage vital signs and the nursing notes.  Pertinent labs & imaging results that were available during my care of the patient were reviewed by me and considered in my medical decision making (see chart for details).     MDM: 1. Cough-CXR revealed mild bibasilar segmental atelectasis, Rx'd Prednisone. Advised/inform patient that COVID-19, influenza A/B were negative.  Advised/inform patient of this chest x-ray results-mild bibasilar segmental atelectasis.  Advised patient to take medication as directed with food to completion.  Encouraged patient increase daily water intake while taking this medication. Final Clinical Impressions(s) / UC Diagnoses   Final diagnoses:  Cough, unspecified type     Discharge Instructions      Advised/inform patient that COVID-19, influenza A/B were negative.  Advised/inform patient of this chest x-ray results-mild bibasilar segmental atelectasis.  Advised patient to take medication as directed with food to completion.  Encouraged patient increase daily water intake while taking this medication.     ED Prescriptions     Medication Sig Dispense Auth. Provider   predniSONE (DELTASONE) 50 MG tablet Take 1 tab p.o. daily for 5 days.  5 tablet Eliezer Lofts, [name]      PDMP not reviewed this encounter.   Eliezer Lofts, [place] 12/01/21 [phone]

## 2021-12-01 NOTE — Discharge Instructions (Addendum)
Advised/inform patient that COVID-19, influenza A/B were negative.  Advised/inform patient of this chest x-ray results-mild bibasilar segmental atelectasis.  Advised patient to take medication as directed with food to completion.  Encouraged patient increase daily water intake while taking this medication.

## 2021-12-01 NOTE — ED Triage Notes (Addendum)
Cough & congestion x  2 days Back pain with cough  Did not check temp - chills at home Thera flu pta Had a flu vaccine  COVID vaccine - bivalent booster a few months ago  Hx of pneumonia - has had a vaccine

## 2021-12-06 LAB — LITHIUM LEVEL: Lithium Lvl: 0.6 mmol/L (ref 0.5–1.2)

## 2021-12-18 ENCOUNTER — Telehealth: Payer: Self-pay

## 2021-12-18 NOTE — Telephone Encounter (Signed)
Prior Authorization submitted and approved for ZALEPLON 10 MG #90 effective 11/18/2021-12/18/2022 with [employer]

## 2021-12-23 ENCOUNTER — Other Ambulatory Visit: Payer: Self-pay | Admitting: Psychiatry

## 2022-01-12 ENCOUNTER — Other Ambulatory Visit: Payer: Self-pay | Admitting: Psychiatry

## 2022-01-12 DIAGNOSIS — F3162 Bipolar disorder, current episode mixed, moderate: Secondary | ICD-10-CM

## 2022-01-12 DIAGNOSIS — F5105 Insomnia due to other mental disorder: Secondary | ICD-10-CM

## 2022-01-12 DIAGNOSIS — Z79899 Other long term (current) drug therapy: Secondary | ICD-10-CM

## 2022-01-15 NOTE — Telephone Encounter (Signed)
Last filled doxepin 10/4 appt on 2/16

## 2022-01-18 ENCOUNTER — Other Ambulatory Visit: Payer: Self-pay | Admitting: Psychiatry

## 2022-01-18 ENCOUNTER — Telehealth (INDEPENDENT_AMBULATORY_CARE_PROVIDER_SITE_OTHER): Payer: Federal, State, Local not specified - PPO | Admitting: Psychiatry

## 2022-01-18 ENCOUNTER — Encounter: Payer: Self-pay | Admitting: Psychiatry

## 2022-01-18 DIAGNOSIS — Z79899 Other long term (current) drug therapy: Secondary | ICD-10-CM

## 2022-01-18 DIAGNOSIS — F411 Generalized anxiety disorder: Secondary | ICD-10-CM | POA: Diagnosis not present

## 2022-01-18 DIAGNOSIS — F3162 Bipolar disorder, current episode mixed, moderate: Secondary | ICD-10-CM

## 2022-01-18 DIAGNOSIS — F422 Mixed obsessional thoughts and acts: Secondary | ICD-10-CM

## 2022-01-18 DIAGNOSIS — G3184 Mild cognitive impairment, so stated: Secondary | ICD-10-CM

## 2022-01-18 DIAGNOSIS — F4000 Agoraphobia, unspecified: Secondary | ICD-10-CM

## 2022-01-18 DIAGNOSIS — F5105 Insomnia due to other mental disorder: Secondary | ICD-10-CM | POA: Diagnosis not present

## 2022-01-18 MED ORDER — QUETIAPINE FUMARATE 25 MG PO TABS: 25.0000 mg | ORAL_TABLET | Freq: Every day | ORAL | 0 refills | Status: DC

## 2022-01-18 MED ORDER — DOXEPIN HCL 25 MG PO CAPS: 50.0000 mg | ORAL_CAPSULE | Freq: Every evening | ORAL | 0 refills | Status: DC | PRN

## 2022-01-18 MED ORDER — LITHIUM CARBONATE 300 MG PO CAPS: 600.0000 mg | ORAL_CAPSULE | Freq: Every day | ORAL | 1 refills | Status: DC

## 2022-01-18 NOTE — Progress Notes (Signed)
Kristen Leon [id number] 1966/05/24 56 y.o.  Video Visit via My Chart  I connected with pt by My Chart and verified that I am speaking with the correct person using two identifiers.   I discussed the limitations, risks, security and privacy concerns of performing an evaluation and management service by My Chart  and the availability of in person appointments. I also discussed with the patient that there may be a patient responsible charge related to this service. The patient expressed understanding and agreed to proceed.  I discussed the assessment and treatment plan with the patient. The patient was provided an opportunity to ask questions and all were answered. The patient agreed with the plan and demonstrated an understanding of the instructions.   The patient was advised to call back or seek an in-person evaluation if the symptoms worsen or if the condition fails to improve as anticipated.  I provided 30 minutes of video time during this encounter.  The patient was located at home and the provider was located office. Session started 1030 until 1100  Subjective:   Patient ID:  Kristen Leon is a 56 y.o. (DOB 1966/11/08) female.  Chief Complaint:  Chief Complaint  Patient presents with   Follow-up    Bipolar 1 disorder, mixed, moderate (HCC)   Medication Problem   Sleeping Problem     Medication Refill Pertinent negatives include no weakness.    Kristen Leon presents  today for follow-up ofBipolar disorder, generalized anxiety disorder, nocturnal panic attacks, history of OCD, and mild cognitive impairment and worsening driving anxiety.  She has a goal of getting off the Seroquel in hopes of improving memory.  at visit August 06, 2019.  We needed to start a mood stabilizer and discussed variety of options and decided to start with Latuda.  We started at 20 mg and the plan was to increase gradually to 80 mg daily.  If that was successful this follow-up was going to lead to a  potential reduction in an attempt to wean Seroquel. Never got up to 80 mg Latuda.    Did not noticed much of a difference.  visit September 03, 2019.  The following changes were made: Increase vitamin D from 5000 units daily to 10K units daily Start Latuda  80 mg daily Start reduction in Seroquel next week and reduce once every 3 weeks. Reduced Seroquel from 600 to 200 mg HS.  At first had sleep and mood problems and angry.  Added in  gabapentin 300 mg total week ago and sleep better.  Able to sleep on her own.  Mood is better also.  visit October 12, 2019.  The following changes were made: Increase vitamin D from 5000 units daily to 10K units daily Continue Latuda  80 mg daily Continue gradual reduction in Seroquel next week and reduce once every 3 to 4 weeks by 50 mg. Trying to get rid of gabapentin and Seroquel for cognitive reasons primarily  seen December 16, 2019.  The following was noted: Down to Seroquel 150 HS for 3 weeks.  Quality of sleepy slightly off.  How long on this dose?  Tends to wake 30 min earlier than usual.  Bought magnesium.   Feels fine in the day.  Slightly less harried and mentally cloudy with reduction in Seroquel. Likes the [place] as mood medication.   Overall mood and anxiety are under control.  Other concern is focus and memory. Wants to get off gabapentin and Seroquel for that reason.  Feels the meds  may increase risk of dementia.  Overall she has seen some cognitive improvement since the reduction in Seroquel as noted. She was in courage to try reducing the Seroquel further gradually.  She was encouraged to leave the gabapentin at 300 mg daily because it was helpful.  seen March 14, 2020.  She was not doing well.  The following was noted: Couldn't sleep without Seroquel 200 mg.  Felt unstable and anxious.  For weeks felt she still struggled at 200 mg Seroquel and raised the dose all the way back up to where she started bc felt desperate and was on edge.   Early  2000's not on Seroquel and had a lot of anxiety and obsessive thought.  It helps anxiety. Mood better on Seroquel and feels better physically and mentally.  A little down with stress and failure of switch to [place].  little anger but not much depression and no other sx of mania.    Driving anxiety and sense unreality resolved.  Fine with driving now. The following changes were made: Continue vitamin D from 5000 units daily to 10K units daily Wean Latuda to 40 mg for a week and then stop it. Then start Saphris 5mg  HS and reduce Seroquel 400 mg at night for 5 days, then increase Saphris 10 mg at night and reduce Seroquel to 200 mg at night for 5 days, then increase Saphris to 15 HS and stop sEroquel.  04/12/2020 appointment the following is noted: As noted pt has been wanting off Seroquel so med changes last visit wre intended to help. More anxious with reduced gabapentin and Seroquel.  Hard to stop Seroquel bc both anxiety and insomnia and using 100 mg Seroquel.  Missed a day of work over it yesterday.  More sleep with Saphris than Latuda.  No SE with saphris. Anxiety included fidgety. Hasn't tried Saphris 15 mg daily yet.   Has felt in a little less foggy headed with reduced Seroquel and gabapentin.  Down on gabapentin for a while now and only a little change with that reduction.  A little better focus and memory now. Neuropsych testing next week. Plan: Increase Saphris 15 mg HS and stop Seroquel again.  Can reduce Seroquel to 50 or even lower if needed.    05/09/2020 phone call with the following noted: Patient had to go back up to 600 mg Seroquel at hs, she has tried the last few nights at 500 mg. She's been unsuccessful tapering down and just wanted to have 200 mg and 100 mg tablets available to try and get it lowered.   07/07/2020 appointment with the following noted: Got close to off Seroquel but felt untethered and trouble with sleep initial and terminal. 5-6 hours on low dose Seroquel. Never  tried Saphris 15 mg for reasons that are unclear.  Stopped shortly after the phone session from last time. Started Seroquel by 2002 and gabapentin in 2003.  Finds it very difficult to get off the meds and worry over instability. Plan: Start Saphris 10 mg at night and reduce Seroquel to 400 mg nightly for 4 nights, Then increase Saphris to 15 mg at night and reduce Seroquel to 200 mg for 4 nights, Then reduce Seroquel to 100 mg and continue Saphris at 15 mg nightly for 4 nights, Then stop Seroquel and if insomnia occurs, increase Saphris to 20 mg at night.  07/19/2020 phone call.  Restless legs on Saphris.  Given instructions to change to loxapine 100 mg nightly. 07/25/2020 phone call patient  stating she wanted to try to continue Saphris but have treatment for the restless legs side effects. Prescription sent for ropinirole  09/13/2020 appointment with the following noted: Not taking Saphris.  At 10 mg HS CO jitteriness and anxiety the next day. Never took loxapine. Wonders about taking loxapine now.  Plan: Give her this change in instructions for transition from Seroquel to loxapine: Start loxapine 25 mg capsule 1 at night and reduce Seroquel to 400 mg nightly for 4 nights, Then increase loxapine to 2 capsules at night and reduce Seroquel to 200 mg for 4 nights, Then reduce Seroquel to 100 mg and increase loxapine to 3 capsules nightly for 4 nights, Then stop Seroquel and if insomnia occurs, increase loxapine to 4 capsules at night and notify MD.   Multiple phone calls since the last visit including the following. 10/24/2020:Telephone call to patient.  She experiences an increase in anxiety whenever she reduces the Seroquel.  She is just started the transition from Seroquel to loxapine.  She is tolerating the loxapine well.  Encouraged her to just use the lorazepam and transition from Seroquel to loxapine.  It is hoped and expected that the loxapine will effectively manage anxiety as the Seroquel  when she is on the full dose.  She agrees to the plan.  Lorazepam prescription sent in. 11/17/2020: RTC  See prior note.  Took 50 mg Seroquel last night.  Realized she made a mistake in dosing.  She stopped from 200 mg Seroquel instead of reducing to 100 mg as instructed.  Had gotten up to 4 loxapine without seroquel was jittery and like she'd come out of her skin and took lorazepam.  Still some problems with memory.  Last night took 50 mg Seroquel and was a little better. Much better today than yesterday.   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   Memory testing with visuospatial problems and was worked up over that.   Take 4 loxapine and start 75 mg Seroquel and taper from there more slowly. Appt 11/22/20  11/22/2020:Pt requesting Rx for Loxapine 25 mg 4/d  30 day and Seroquel 200 mg 3/d   30 day and Lorazepam 0.5 mg 1-2 tab every 8 hrs PRN for anxiety  @ [facility] [employer] on file. Apt RS from 12/21 to 2/9 and on canc list. Pt going out of town 12/27-1/3 asking for Rx before 12/27.  11/22/2020 MD response: I sent in the prescription for lorazepam.  Patient is not on quetiapine 200 mg 3 daily and I will not prescribe that dose.  Ask her what dose of quetiapine she is actually taking.  Also the insurance will not cover 4 loxapine capsules daily.  If she is taking 4 I will need to change it to 2 of the 50 mg capsules.  Please verify her current dose of loxapine and quetiapine.  I am weaning her off of quetiapine she should not be on that high of a dose of quetiapine. 11/23/2020:Pt called back stating she would like to stop Loxapine completely. She is having blurred vision and jittery. She did not pick up Loxapine Rx. She is currently taking quetiapine 100 mg (4-25 mg tabs daily). Requesting to go back to 600 mg daily. Requesting Rx for this.  Agreed 12/01/2020:Kristiann called to request that you decrease her gabapentin and prescribe Buspar.  Please call  to discuss this.  appt 01/11/21.  MD response: Took buspar years ago and wants to try  it again and reduce gabapentin.  Currently on 1200 mg HS.  Reduce to 900 mg gabapentin.   Start buspirone 5 mg twice daily, and increase to 15 mg BID. Disc SE.  She agrees.  01/11/2021 appointment with the following noted: Completely off gabapentin for a couple of weeks maybe.  Initially anxiety a little high in AM.  Increased buspirone. Had increase anxiety when off Seroquel.  Was off about 4 days and memory was not better.  Anxiety is better now. Anxiety is a little higher off gabapentin but not unmanageable.  Sleep is not as good off the gabapentin.  More initial insomnia and takes melatonin to manage. Not marked difference with cognition off gabapentin. Read about antipsychotics reducing brain volume and still wants to stop Seroquel despite multiple failed attempts. Wants to switch from Seroquel to trazodone.  Plan: OK trial Seroquel reduction to 400 mg daily with no other med changes.  02/17/2021 appointment with the following noted: Stopped buspirone without change. Stopped gabapentin. Reduced Seroquel to 400 mg HS. She is convinced antipsychotic is causing brain loss.  She wants to be off antipsychotics bc of this concern and risk of dementia.  Don't think fears of dementia are unfounded. Doesn't believe she has psychotic sx. Wants to retry lithium and then trazodone in place of Seroquel.  Took lithium a long time.   Acknowledges prior failures weaning Seroquel multiple times but thinks maybe it was too fast.  Sleep most of the night and sleep less deep than 600 mg Seroquel.  Also less deep without gabapentin.  Altogether 6-7 hours but leaves the TV on.   In bed 8 watching TV and doesn't try to sleep until 1030. Plan: She  failed attempts to get off Seroquel multiple times. OK trial Seroquel reduction to 300 mg daily for 3-4 weeks, then 200 mg for 3-4 weeks. Disc withdrawal effects from  Seroquel. Start lithium 300 mg nightly for a week then 600 mg nightly. Wait a week and get blood level.    04/12/2021 appointment with the following noted: Did get down to Seroquel 200 mg HS and up to lithium 600 HS and trazodone 100 mg HS. Sleep more interrupted but 6-7 hours with poor quality and less well rested with change.  Mood really good with lithium and maybe better. Often had rapid cycling and it seemed to stop. But "sleep sucks" and feels it affects her during the day Still takes mirtazapine 15 mg and added trazodone.  Sleep worse without trazodone. No SE with lithium. Cognition  more clear with better focus with less Seroquel.   Patient reports stable mood and denies depressed or irritable moods.  Patient denies any recent difficulty with anxiety, surprisingly. Denies appetite disturbance.  Patient reports that energy and motivation have been good.  Patient denies any difficulty with concentration.  Patient denies any suicidal ideation.  Plan: Continue Seroquel  200 mg for 3-4 weeks. Continue lithium 600 mg nightly. Check another level  Stop mirtazapine and trazodone Doxepin 10-30 mg HS   04/24/2021 phone call complaining of obsessive thinking and wanting medicine sent into the pharmacy. MD response: Her case is very complicated because she has complained of side effects from atypicals and worried about cognitive side effects of benzodiazepines.  She is bipolar and therefore we cannot use SSRIs.  She does not want to take atypical antipsychotics.  She is on a low dose of Seroquel now and I am sure does not want to increase the dosage back up.  She  has a as needed prescription for lorazepam she can use that.  I will not make any other major med changes over the phone outside of a scheduled appointment.  If the symptoms are bothersome enough however going to the cancellation list and we will attempt to get her into an appointment sooner.  06/01/2021 appointment with the following  noted: Thinks she stopped trazodone and ? Took doxepin.  She vaguely think she tried it and it didn't help sleep. Mood more stable on lithium 600 HS.  Prior mood cycling into dep and mania have resolved on it.. Still problems with her sleep. Increased Seroquel to 400 mg on her own for sleep added gabapentin 600 and sleep is better but wants off both of them. Still on mirtazapine 30.   Cholecystectomy scheduled for August. Had stopped Seroquel and then had intrusive thoughts which have now resolved.  At the time she called they were bad. Plan: Continue lithium 600 mg nightly. Check another level before next appt Wants to try again to taper quetiapine, will proceed as follows: Stop mirtazapine DT NR Retry Doxepin at higher dose of 50 mg up to 100 mg HS Once sleeping well then try to taper Seroquel very slowly such as reduce by 50 mg every 2 to 3 weeks or even slower if necessary.  09/05/2021 appointment with the following noted: Got down to Seroquel 200 and taking gabapentin 600 mg HS. Never tried 50-100 mg for sleep. Off track for 6 weeks without enough sleep.  Cholecystectomy recently. On phone with BF too much at night talking to him bc of his work schedule.   More irritable and tired.   Older cousin moved in for 3 mos and stressed by that and esp bc he brought a dog.  He's taking advantage of her and brought his GF. Stopped lithium AMA bc "I was afraid of it".   I want to go back on lithium bc it helped and retry doxepin at higher dose and try wean Seroquel.  Then wean gabapenin.   Was more focused and better mood stability on lithium. Mirtazapine does not help sleep much. Paranoid about being hurt if she dates and no history of it.  Develop a whole story about what might happen. Plan: Stop mirtazapine DT NR Retry Doxepin at higher dose of 50 mg up to 100 mg HS Once sleeping well then try to taper Seroquel very slowly such as reduce by 50 mg every 2 to 3 weeks or even slower if  necessary. Restart lithium 600 mg nightly. Check another level before next appt Don't stop meds AMA.   Call if there's a problem.  11/16/21 appt noted: Second week of Seroquel 150 mg daily. Is sleeping currently but not the same quality but not terrible.  On doxepin 75 mg HS Mood is good.  Likes the lithium better than Seroquel for mood.  Fewer hypomanic episones and more stable with less anger. Never got lithium level.  Says she'll do so. Rare lorazepam. No SE right now. Asks about Ozempic. Plan: continue Doxepin at higher dose of 50 mg up to 100 mg HS Once sleeping well then try to taper Seroquel very slowly such as reduce by 50 mg every 2 to 3 weeks or even slower if necessary. Continue lithium 600 mg nightly. Get lithium level ASAP.  Then repeat lithium level with every 20 # of weight loss Don't stop meds AMA.   Call if there's a problem.  01/18/2022 appointment with the following noted: Ran  out of doxepin and says refill rejected for 3 weeks. Was feeling kind of down. Down to Seroquel 25 mg HS for 2 + weeks. Still sleeping with some awakening.  Surprising good sleep.  6 and 1/2 hours of sleep and not drowsy daytime. Mood is fine.   Not sure if doxepin helped sleep but would like to have it. Patient reports stable mood and denies depressed or irritable moods.  Patient denies any recent difficulty with anxiety.  Patient denies difficulty with sleep initiation or maintenance. Denies appetite disturbance.  Patient reports that energy and motivation have been good.  Patient denies any difficulty with concentration.  Patient denies any suicidal ideation.    When manic feels more reckless, spending, talk to herself, driven, reduced sleep, irritable and angry.  Past Psychiatric Medication Trials: Vraylar 3 SE, Abilify, brief Latuda ? effect,  Depakote NR, carbamazepine questionable side effects, lamotrigine lost response, Trileptal,  lithium no response, clozapine, olanzapine ,  Seroquel 600, risperidone 1 mg twice daily  , Saphris CO anxiety mirtazapine,  Gabapentin cog SE,  Trazodone 200, doxepin 30 not sedating several SSRIs, clomipramine had vision side effects,   failed an attempt to switch from Seroquel to [place] early 2020  Failed switch from Seroquel to [place], Saphris, Vraylar and loxapine No psych hosp.  Review of Systems:  Review of Systems  Cardiovascular:  Negative for palpitations.  Neurological:  Negative for dizziness, tremors, weakness and light-headedness.  Psychiatric/Behavioral:  Positive for decreased concentration and sleep disturbance. Negative for agitation, behavioral problems, confusion, dysphoric mood, hallucinations, self-injury and suicidal ideas. The patient is not nervous/anxious and is not hyperactive.    Medications: I have reviewed the patient's current medications.  Current Outpatient Medications  Medication Sig Dispense Refill   Acetylcysteine (NAC) 600 MG CAPS Take 1 capsule (600 mg total) by mouth daily. 90 capsule 1   atenolol (TENORMIN) 50 MG tablet Take 50 mg by mouth at bedtime.     B Complex Vitamins (VITAMIN B-COMPLEX) TABS Take 1 tablet by mouth at bedtime.     Calcium Carbonate-Vitamin D (CALCIUM-D PO) Take by mouth at bedtime.     Cholecalciferol (VITAMIN D3) 5000 units CAPS Take 1 capsule by mouth at bedtime.     folic acid (FOLVITE) A999333 MCG tablet Take 400 mcg by mouth at bedtime.     gabapentin (NEURONTIN) 600 MG tablet Take 300 mg by mouth at bedtime.     levothyroxine (SYNTHROID, LEVOTHROID) 112 MCG tablet Take 112 mcg by mouth at bedtime.     linaclotide (LINZESS) 290 MCG CAPS capsule Take 290 mcg by mouth at bedtime.     LORazepam (ATIVAN) 0.5 MG tablet Take 1-2 tablets (0.5-1 mg total) by mouth every 8 (eight) hours as needed for anxiety. 50 tablet 0   MAGNESIUM PO Take 400 mg by mouth at bedtime.     memantine (NAMENDA) 10 MG tablet TAKE 1 TABLET TWICE A DAY 180 tablet 0   tretinoin (RETIN-A) 0.025 %  cream Apply topically at bedtime. face     zaleplon (SONATA) 10 MG capsule Take 1 capsule (10 mg total) by mouth at bedtime as needed for sleep. 90 capsule 0   doxepin (SINEQUAN) 25 MG capsule Take 2-3 capsules (50-75 mg total) by mouth at bedtime as needed. 180 capsule 0   lithium carbonate 300 MG capsule Take 2 capsules (600 mg total) by mouth daily. 180 capsule 1   QUEtiapine (SEROQUEL) 25 MG tablet Take 1 tablet (25 mg total) by mouth at  bedtime. 90 tablet 0   No current facility-administered medications for this visit.    Medication Side Effects: None  Allergies: No Known Allergies  Past Medical History:  Diagnosis Date   Anxiety    Bipolar 1 disorder ([place])    followed by dr c. cottle   Carpal tunnel syndrome on both sides    CKD (chronic kidney disease), stage II    followed by pcp   Endometriosis    Hypothyroidism, postablative    endocrinologist--- dr c. Barnet Pall--- dx toxic multinodular thyroid 2000 w/ hyperthyroidism s/p RAI  2000, 2002, and 2004;  post hypothryoid in 2008;   last bx 2012 left side, benign per pt   Irritable bowel syndrome with constipation    followed by dr w. Glennon Hamilton ([facility])   MDD (major depressive disorder)    Multinodular thyroid    in 2000 dx toxic post ablative   NAFLD (nonalcoholic fatty liver disease)    OSA (obstructive sleep apnea)    05-18-2021  per pt study done approx. 2018, told moderat OSA,  does uses cpap   Tachycardia    cardiologist--- Sigurd Sos NP @ [facility] in [place],  w/ palpitations, controlled w/ atenolol;  pt has had work-up per note echo 2014 normal ,  event monitor 2018 showedST w/ mild ST no arrhythmia,  ETT 02/ 2022 normal no ischemia, ef 67%   Wears contact lenses     Family History  Problem Relation Age of Onset   Thyroid disease Mother    Diabetes Mother    Hypertension Mother     Social History   Socioeconomic History   Marital status: Divorced    Spouse name: Not on file   Number of children: Not on file   Years of  education: Not on file   Highest education level: Not on file  Occupational History   Not on file  Tobacco Use   Smoking status: Former    Years: 5.00    Types: Cigarettes    Quit date: 2000    Years since quitting: 23.1   Smokeless tobacco: Never  Vaping Use   Vaping Use: Never used  Substance and Sexual Activity   Alcohol use: Yes    Alcohol/week: 1.0 standard drink    Types: 1 Standard drinks or equivalent per week   Drug use: Never   Sexual activity: Not on file  Other Topics Concern   Not on file  Social History Narrative   Not on file   Social Determinants of Health   Financial Resource Strain: Not on file  Food Insecurity: Not on file  Transportation Needs: Not on file  Physical Activity: Not on file  Stress: Not on file  Social Connections: Not on file  Intimate Partner Violence: Not on file    Past Medical History, Surgical history, Social history, and Family history were reviewed and updated as appropriate.   Please see review of systems for further details on the patient's review from today.   Objective:   Physical Exam:  There were no vitals taken for this visit.  Physical Exam Neurological:     Mental Status: She is alert and oriented to person, place, and time.     Cranial Nerves: No dysarthria.  Psychiatric:        Attention and Perception: She is inattentive.        Mood and Affect: Mood is not anxious or depressed.        Speech: Speech normal. Speech is not rapid  and pressured or slurred.        Behavior: Behavior is cooperative.        Thought Content: Thought content is not paranoid or delusional. Thought content does not include homicidal or suicidal ideation. Thought content does not include suicidal plan.        Cognition and Memory: She exhibits impaired recent memory. She does not exhibit impaired remote memory.        Judgment: Judgment normal.     Comments: Insight and judgment appear good. Denies mania symptoms  Better mood  stability back on lithium 600 mg daily    Lab Review:     Component Value Date/Time   NA 136 04/10/2021 1035   K 4.3 04/10/2021 1035   CL 97 04/10/2021 1035   CO2 25 04/10/2021 1035   GLUCOSE 77 04/10/2021 1035   BUN 13 04/10/2021 1035   CREATININE 0.94 04/10/2021 1035   CALCIUM 9.7 04/10/2021 1035       Component Value Date/Time   WBC 6.6 05/24/2021 0630   RBC 3.79 (L) 05/24/2021 0630   HGB 11.3 (L) 05/24/2021 0630   HCT 35.0 (L) 05/24/2021 0630   PLT 325 05/24/2021 0630   MCV 92.3 05/24/2021 0630   MCH 29.8 05/24/2021 0630   MCHC 32.3 05/24/2021 0630   RDW 13.2 05/24/2021 0630    Lithium Lvl  Date Value Ref Range Status  12/05/2021 0.6 0.5 - 1.2 mmol/L Final    Comment:    A concentration of 0.5-0.8 mmol/L is advised for long-term use; concentrations of up to 1.2 mmol/L may be necessary during acute treatment.                                  Detection Limit = 0.1                           <0.1 indicates None Detected   12/05/2021 lithium level 0.6 on 600 mg daily stable  04/10/2021 lithium level 0.8 on 600 mg daily.  No results found for: PHENYTOIN, PHENOBARB, VALPROATE, CBMZ   Took lab orders to PCP from visit in September and reports results: B12 >2000 Folate > 20 D 43.5 Iron TIBC 297 (250-450)  UIBC 156 (131-425)  Iron 141 (27-159)  Iron saturation 47 (15-55)   .res Assessment: Plan:    Bipolar 1 disorder, mixed, moderate ([place]) - Plan: lithium carbonate 300 MG capsule, QUEtiapine (SEROQUEL) 25 MG tablet  Generalized anxiety disorder  Mixed obsessional thoughts and acts  Insomnia due to mental condition - Plan: doxepin (SINEQUAN) 25 MG capsule, QUEtiapine (SEROQUEL) 25 MG tablet  Lithium use - Plan: lithium carbonate 300 MG capsule  MCI (mild cognitive impairment)  Agoraphobia   Greater than 50% of 30-minute video time with patient was spent on counseling and coordination of care. We discussed nature of her psychiatric diagnoses .   She remains  motivated to get off Seroquel because she believes is causing cognitive problems and fears brain matter loss.  Mood stable so far with less Seroquel.. Benefit from lithium.  Bipolar disorder Was worse with reduction in Seroquel until addtion of lithium has helped so far.  She feels lithium has provided better mood stability than does Seroquel. Failed switch from Seroquel to [place], [place], Vraylar and loxapine   Counseled patient regarding potential benefits, risks, and side effects of lithium to include potential risk of lithium  affecting thyroid and renal function.  Discussed need for periodic lab monitoring to determine drug level and to assess for potential adverse effects.  Counseled patient regarding signs and symptoms of lithium toxicity and advised that they notify office immediately or seek urgent medical attention if experiencing these signs and symptoms.  Patient advised to contact office with any questions or concerns.  continue Doxepin at higher dose of 50 mg up to 100 mg HS Once sleeping well then try to taper Seroquel very slowly   Continue lithium 600 mg nightly. 12/05/2021 lithium level 0.6 on 600 mg daily stable Then repeat lithium level with every 20 # of weight loss Don't stop meds AMA.   Call if there's a problem.  Sleep hygiene incl no bed without sleep.  Sleep options Belsomra, Dayvigo, doxepin, Rozerem, hydroxyzine.  Prefer avoid BZ and bc cognitive concerns she has. Wants Sonata rarely.  Continue memantine 10 BID off label for cognitive problems.  OK prn lorazepam.  We discussed the short-term risks associated with benzodiazepines including sedation and increased fall risk among others.  Discussed long-term side effect risk including dependence, potential withdrawal symptoms, and the potential eventual dose-related risk of dementia.  But recent studies from 2020 dispute this association between benzodiazepines and dementia risk. Newer studies in 2020 do not support an  association with dementia.  Discussed potential metabolic side effects associated with atypical antipsychotics, as well as potential risk for movement side effects. Advised pt to contact office if movement side effects occur.   Disc cognitive risks at length with meds and risks of untreated bipolar disorder for cognition as well.  She is taking Namenda 10 mg twice daily off label for help with this symptom. Disc the off-label use of N-Acetylcysteine at 600 mg daily to help with mild cognitive problems.  It can be combined with a B-complex vitamin as the B-12 and folate have been shown to sometimes enhance the effect.  availability problems with NAC have resolved.  She took it in the past.  Thyroid was normal recently. B12 and folate are normal D is OK but goal with her should be 50s-60s Disc reasons in detail including Dr. Missy Sabins recommendation. .  Disc neuroprotective literature on lithium. Dr. Olene Floss article  Greater than 50% of 30-minutevideo time with patient was spent on counseling and coordination of care.   FU 12 weeks  Lynder Parents, MD, DFAPA   Please see After Visit Summary for patient specific instructions.  No future appointments.    No orders of the defined types were placed in this encounter.      -------------------------------

## 2022-03-08 ENCOUNTER — Other Ambulatory Visit: Payer: Self-pay | Admitting: Psychiatry

## 2022-03-31 ENCOUNTER — Other Ambulatory Visit: Payer: Self-pay | Admitting: Psychiatry

## 2022-03-31 DIAGNOSIS — F3162 Bipolar disorder, current episode mixed, moderate: Secondary | ICD-10-CM

## 2022-03-31 DIAGNOSIS — F5105 Insomnia due to other mental disorder: Secondary | ICD-10-CM

## 2022-04-16 ENCOUNTER — Encounter: Payer: Self-pay | Admitting: Psychiatry

## 2022-04-16 ENCOUNTER — Telehealth (INDEPENDENT_AMBULATORY_CARE_PROVIDER_SITE_OTHER): Payer: Self-pay | Admitting: Psychiatry

## 2022-04-16 DIAGNOSIS — Z91148 Patient's other noncompliance with medication regimen for other reason: Secondary | ICD-10-CM

## 2022-04-16 DIAGNOSIS — Z79899 Other long term (current) drug therapy: Secondary | ICD-10-CM

## 2022-04-16 DIAGNOSIS — F422 Mixed obsessional thoughts and acts: Secondary | ICD-10-CM

## 2022-04-16 DIAGNOSIS — F5105 Insomnia due to other mental disorder: Secondary | ICD-10-CM

## 2022-04-16 DIAGNOSIS — F3162 Bipolar disorder, current episode mixed, moderate: Secondary | ICD-10-CM

## 2022-04-16 DIAGNOSIS — F4000 Agoraphobia, unspecified: Secondary | ICD-10-CM

## 2022-04-16 DIAGNOSIS — G3184 Mild cognitive impairment, so stated: Secondary | ICD-10-CM

## 2022-04-16 DIAGNOSIS — F411 Generalized anxiety disorder: Secondary | ICD-10-CM

## 2022-04-16 MED ORDER — QUETIAPINE FUMARATE 25 MG PO TABS: 25.0000 mg | ORAL_TABLET | Freq: Every day | ORAL | 0 refills | Status: DC

## 2022-04-16 MED ORDER — ZALEPLON 10 MG PO CAPS: 10.0000 mg | ORAL_CAPSULE | Freq: Every evening | ORAL | 0 refills | Status: DC | PRN

## 2022-04-16 MED ORDER — MEMANTINE HCL 10 MG PO TABS: 10.0000 mg | ORAL_TABLET | Freq: Two times a day (BID) | ORAL | 0 refills | Status: DC

## 2022-04-16 NOTE — Progress Notes (Signed)
Kristen Leon ?[id number] ?1966-11-25 ?56 y.o. ? ?Video Visit via My Chart ? ?I connected with pt by My Chart video and verified that I am speaking with the correct person using two identifiers. ?  ?I discussed the limitations, risks, security and privacy concerns of performing an evaluation and management service by My Chart  and the availability of in person appointments. I also discussed with the patient that there may be a patient responsible charge related to this service. The patient expressed understanding and agreed to proceed. ? ?I discussed the assessment and treatment plan with the patient. The patient was provided an opportunity to ask questions and all were answered. The patient agreed with the plan and demonstrated an understanding of the instructions. ?  ?The patient was advised to call back or seek an in-person evaluation if the symptoms worsen or if the condition fails to improve as anticipated. ? ?I provided 30 minutes of video time during this encounter.  The patient was located at home and the provider was located office. ?Session started 1030 until 1100 ? ?Subjective:  ? ?Patient ID:  Kristen Leon is a 56 y.o. (DOB 01-31-1966) female. ? ?Chief Complaint:  ?Chief Complaint  ?Patient presents with  ? Follow-up  ? Depression  ? Anxiety  ? Memory Loss  ? ? ? ?Medication Refill ?Pertinent negatives include no weakness.    ?[name] presents  today for follow-up ofBipolar disorder, generalized anxiety disorder, nocturnal panic attacks, history of OCD, and mild cognitive impairment and worsening driving anxiety.  She has a goal of getting off the Seroquel in hopes of improving memory. ? ?at visit August 06, 2019.  We needed to start a mood stabilizer and discussed variety of options and decided to start with Latuda.  We started at 20 mg and the plan was to increase gradually to 80 mg daily.  If that was successful this follow-up was going to lead to a potential reduction in an attempt to wean  Seroquel. ?Never got up to 80 mg Latuda.    ?Did not noticed much of a difference. ? ?visit September 03, 2019.  The following changes were made: ?Increase vitamin D from 5000 units daily to 10K units daily ?Start Latuda  80 mg daily ?Start reduction in Seroquel next week and reduce once every 3 weeks. ?Reduced Seroquel from 600 to 200 mg HS.  At first had sleep and mood problems and angry.  Added in  gabapentin 300 mg total week ago and sleep better.  Able to sleep on her own.  Mood is better also. ? ?visit October 12, 2019.  The following changes were made: ?Increase vitamin D from 5000 units daily to 10K units daily ?Continue Latuda  80 mg daily ?Continue gradual reduction in Seroquel next week and reduce once every 3 to 4 weeks by 50 mg. ?Trying to get rid of gabapentin and Seroquel for cognitive reasons primarily ? ?seen December 16, 2019.  The following was noted: ?Down to Seroquel 150 HS for 3 weeks.  Quality of sleepy slightly off.  How long on this dose?  Tends to wake 30 min earlier than usual.  Bought magnesium.   ?Feels fine in the day.  Slightly less harried and mentally cloudy with reduction in Seroquel. ?Likes the [place] as mood medication.   ?Overall mood and anxiety are under control. ? Other concern is focus and memory. Wants to get off gabapentin and Seroquel for that reason.  Feels the meds may increase risk of dementia.  Overall she has seen some cognitive improvement since the reduction in Seroquel as noted. ?She was in courage to try reducing the Seroquel further gradually.  She was encouraged to leave the gabapentin at 300 mg daily because it was helpful. ? ?seen March 14, 2020.  She was not doing well.  The following was noted: ?Couldn't sleep without Seroquel 200 mg.  Felt unstable and anxious.  For weeks felt she still struggled at 200 mg Seroquel and raised the dose all the way back up to where she started bc felt desperate and was on edge.   ?Early 2000's not on Seroquel and had a lot of  anxiety and obsessive thought.  It helps anxiety. ?Mood better on Seroquel and feels better physically and mentally.  A little down with stress and failure of switch to [place].  little anger but not much depression and no other sx of mania.    Driving anxiety and sense unreality resolved.  Fine with driving now. ?The following changes were made: ?Continue vitamin D from 5000 units daily to 10K units daily ?Wean Latuda to 40 mg for a week and then stop it. ?Then start Saphris 5mg  HS and reduce Seroquel 400 mg at night for 5 days, then increase Saphris 10 mg at night and reduce Seroquel to 200 mg at night for 5 days, then increase Saphris to 15 HS and stop sEroquel. ? ?04/12/2020 appointment the following is noted: ?As noted pt has been wanting off Seroquel so med changes last visit wre intended to help. More anxious with reduced gabapentin and Seroquel.  Hard to stop Seroquel bc both anxiety and insomnia and using 100 mg Seroquel.  Missed a day of work over it yesterday.  More sleep with Saphris than Latuda.  No SE with saphris. ?Anxiety included fidgety. ?Hasn't tried Saphris 15 mg daily yet.   ?Has felt in a little less foggy headed with reduced Seroquel and gabapentin.  Down on gabapentin for a while now and only a little change with that reduction.  A little better focus and memory now. ?Neuropsych testing next week. ?Plan: Increase Saphris 15 mg HS and stop Seroquel again.  Can reduce Seroquel to 50 or even lower if needed.   ? ?05/09/2020 phone call with the following noted: Patient had to go back up to 600 mg Seroquel at hs, she has tried the last few nights at 500 mg. She's been unsuccessful tapering down and just wanted to have 200 mg and 100 mg tablets available to try and get it lowered.  ? ?07/07/2020 appointment with the following noted: ?Got close to off Seroquel but felt untethered and trouble with sleep initial and terminal. 5-6 hours on low dose Seroquel. ?Never tried Saphris 15 mg for reasons that are  unclear.  Stopped shortly after the phone session from last time. ?Started Seroquel by 2002 and gabapentin in 2003.  Finds it very difficult to get off the meds and worry over instability. ?Plan: Start Saphris 10 mg at night and reduce Seroquel to 400 mg nightly for 4 nights, ?Then increase Saphris to 15 mg at night and reduce Seroquel to 200 mg for 4 nights, ?Then reduce Seroquel to 100 mg and continue Saphris at 15 mg nightly for 4 nights, ?Then stop Seroquel and if insomnia occurs, increase Saphris to 20 mg at night. ? ?07/19/2020 phone call.  Restless legs on Saphris.  Given instructions to change to loxapine 100 mg nightly. ?07/25/2020 phone call patient stating she wanted to try to  continue Saphris but have treatment for the restless legs side effects. ?Prescription sent for ropinirole ? ?09/13/2020 appointment with the following noted: ?Not taking Saphris.  At 10 mg HS CO jitteriness and anxiety the next day. ?Never took loxapine. ?Wonders about taking loxapine now. ? Plan: Give her this change in instructions for transition from Seroquel to loxapine: ?Start loxapine 25 mg capsule 1 at night and reduce Seroquel to 400 mg nightly for 4 nights, ?Then increase loxapine to 2 capsules at night and reduce Seroquel to 200 mg for 4 nights, ?Then reduce Seroquel to 100 mg and increase loxapine to 3 capsules nightly for 4 nights, ?Then stop Seroquel and if insomnia occurs, increase loxapine to 4 capsules at night and notify MD.  ? ?Multiple phone calls since the last visit including the following. ?10/24/2020:Telephone call to patient.  She experiences an increase in anxiety whenever she reduces the Seroquel.  She is just started the transition from Seroquel to loxapine.  She is tolerating the loxapine well.  Encouraged her to just use the lorazepam and transition from Seroquel to loxapine.  It is hoped and expected that the loxapine will effectively manage anxiety as the Seroquel when she is on the full dose.  She  agrees to the plan.  Lorazepam prescription sent in. ?11/17/2020: RTC ? See prior note.  Took 50 mg Seroquel last night.  Realized she made a mistake in dosing.  She stopped from 200 mg Seroquel instead of reducing

## 2022-05-11 ENCOUNTER — Telehealth: Payer: Self-pay | Admitting: Psychiatry

## 2022-05-11 ENCOUNTER — Other Ambulatory Visit: Payer: Self-pay

## 2022-05-11 DIAGNOSIS — Z79899 Other long term (current) drug therapy: Secondary | ICD-10-CM

## 2022-05-11 DIAGNOSIS — F3162 Bipolar disorder, current episode mixed, moderate: Secondary | ICD-10-CM

## 2022-05-11 MED ORDER — LITHIUM CARBONATE 300 MG PO CAPS: 600.0000 mg | ORAL_CAPSULE | Freq: Every day | ORAL | 0 refills | Status: DC

## 2022-05-11 NOTE — Telephone Encounter (Signed)
Kristen Leon called this morning at 9:40 to request refill of her Lithium.  She told you she didn't need a refill but she does.  Next appt 07/04/22.  Send to [facility].  It will need to be a 90 day supply

## 2022-05-11 NOTE — Telephone Encounter (Signed)
Rx sent 

## 2022-05-14 ENCOUNTER — Other Ambulatory Visit: Payer: Self-pay

## 2022-05-14 MED ORDER — LITHIUM CARBONATE 150 MG PO CAPS: 150.0000 mg | ORAL_CAPSULE | Freq: Every day | ORAL | 0 refills | Status: DC

## 2022-05-14 NOTE — Telephone Encounter (Signed)
Rx sent 

## 2022-05-14 NOTE — Telephone Encounter (Signed)
Pt Lvm on 6/9 at 5:26p.  The script sent to the mail order pharmacy was for 300 mg Lithium.  She needs 150mg  Lithium.  She would like 90 day supply.  Next appt 8/2

## 2022-06-21 ENCOUNTER — Encounter: Payer: Self-pay | Admitting: Internal Medicine

## 2022-06-21 ENCOUNTER — Ambulatory Visit: Payer: Federal, State, Local not specified - PPO | Admitting: Internal Medicine

## 2022-06-21 ENCOUNTER — Other Ambulatory Visit: Payer: Self-pay

## 2022-06-21 VITALS — BP 135/89 | HR 86 | Temp 98.5°F | Wt 183.0 lb

## 2022-06-21 DIAGNOSIS — N951 Menopausal and female climacteric states: Secondary | ICD-10-CM | POA: Diagnosis not present

## 2022-06-21 MED ORDER — CEFADROXIL 1 G PO TABS: 1.0000 g | ORAL_TABLET | Freq: Two times a day (BID) | ORAL | 0 refills | Status: DC

## 2022-06-21 MED ORDER — METRONIDAZOLE 500 MG PO TABS: 500.0000 mg | ORAL_TABLET | Freq: Two times a day (BID) | ORAL | 0 refills | Status: DC

## 2022-06-21 NOTE — Progress Notes (Signed)
Patient ID: Kristen Leon, female   DOB: 1966/01/13, 56 y.o.   MRN: [id number]  HPI Kristen Leon is a 56yo F who is referred by her ob/gyn for chronic recurrent vaginal irritation. On pramirn cream but maide it worse. Has been on vagifem. In may for presumed BV (gardnerella, tric, and candida was negative) and also has vulvodynia.  A year ago had amox/clav - that didn't work. She recently went to [facility] and had course of amox/clav that didn't work either. She had- speculum exam -swabbed vagina and found strep anginosus and ecoli (no sensitivities)Feels like she is having a vaginal discharge, but no drainage. Feels like its ongoing for months but not improved despite those abtx recs  Feels like she had that 2 years ago that it worked but this time its not going away  Outpatient Encounter Medications as of 06/21/2022  Medication Sig   Acetylcysteine (NAC) 600 MG CAPS Take 1 capsule (600 mg total) by mouth daily.   atenolol (TENORMIN) 50 MG tablet Take 50 mg by mouth at bedtime.   B Complex Vitamins (VITAMIN B-COMPLEX) TABS Take 1 tablet by mouth at bedtime.   Calcium Carbonate-Vitamin D (CALCIUM-D PO) Take by mouth at bedtime.   Cholecalciferol (VITAMIN D3) 5000 units CAPS Take 1 capsule by mouth at bedtime.   folic acid (FOLVITE) 400 MCG tablet Take 400 mcg by mouth at bedtime.   levothyroxine (SYNTHROID, LEVOTHROID) 112 MCG tablet Take 112 mcg by mouth at bedtime.   linaclotide (LINZESS) 290 MCG CAPS capsule Take 290 mcg by mouth at bedtime.   lithium carbonate 150 MG capsule Take 1 capsule (150 mg total) by mouth daily.   lithium carbonate 300 MG capsule Take 2 capsules (600 mg total) by mouth daily.   LORazepam (ATIVAN) 0.5 MG tablet Take 1-2 tablets (0.5-1 mg total) by mouth every 8 (eight) hours as needed for anxiety.   MAGNESIUM PO Take 400 mg by mouth at bedtime.   memantine (NAMENDA) 10 MG tablet Take 1 tablet (10 mg total) by mouth 2 (two) times daily.   QUEtiapine (SEROQUEL) 25 MG  tablet Take 1 tablet (25 mg total) by mouth at bedtime.   zaleplon (SONATA) 10 MG capsule Take 1 capsule (10 mg total) by mouth at bedtime as needed for sleep.   gabapentin (NEURONTIN) 600 MG tablet Take 300 mg by mouth at bedtime. (Patient not taking: Reported on 04/16/2022)   tretinoin (RETIN-A) 0.025 % cream Apply topically at bedtime. face (Patient not taking: Reported on 06/21/2022)   No facility-administered encounter medications on file as of 06/21/2022.     Patient Active Problem List   Diagnosis Date Noted   Bipolar 1 disorder, mixed, moderate ([facility]) 08/29/2018   OCD (obsessive compulsive disorder) 08/29/2018   MCI (mild cognitive impairment) 08/29/2018     Health Maintenance Due  Topic Date Due   Hepatitis C Screening  Never done   [name]  Never done   COLONOSCOPY (Pts 45-93yrs Insurance coverage will need to be confirmed)  Never done   MAMMOGRAM  Never done    Sochx: no smoking. 1 drink per week Review of Systems +vaginal fullness, no discharge. No recent sex. Physical Exam   BP 135/89   Pulse 86   Temp 98.5 F (36.9 C) (Oral)   Wt 183 lb (83 kg)   BMI 29.54 kg/m    Gen =a xo by 3 in NAD Ext= c/c/e  CBC Lab Results  Component Value Date   WBC 6.6 05/24/2021  RBC 3.79 (L) 05/24/2021   HGB 11.3 (L) 05/24/2021   HCT 35.0 (L) 05/24/2021   PLT 325 05/24/2021   MCV 92.3 05/24/2021   MCH 29.8 05/24/2021   MCHC 32.3 05/24/2021   RDW 13.2 05/24/2021    BMET Lab Results  Component Value Date   NA 136 04/10/2021   K 4.3 04/10/2021   CL 97 04/10/2021   CO2 25 04/10/2021   GLUCOSE 77 04/10/2021   BUN 13 04/10/2021   CREATININE 0.94 04/10/2021   CALCIUM 9.7 04/10/2021      Assessment and Plan Chronic vaginal dryness c/w menopausal changes, + bacterial shift = will try a course of cefadroxil with metronidazole to see if any further symptom relief. If no improvement, repeat speculum exam and discussion with her ob for symptom relief.

## 2022-07-04 ENCOUNTER — Telehealth (INDEPENDENT_AMBULATORY_CARE_PROVIDER_SITE_OTHER): Payer: Federal, State, Local not specified - PPO | Admitting: Psychiatry

## 2022-07-04 ENCOUNTER — Encounter: Payer: Self-pay | Admitting: Psychiatry

## 2022-07-04 DIAGNOSIS — F422 Mixed obsessional thoughts and acts: Secondary | ICD-10-CM

## 2022-07-04 DIAGNOSIS — F411 Generalized anxiety disorder: Secondary | ICD-10-CM

## 2022-07-04 DIAGNOSIS — F5105 Insomnia due to other mental disorder: Secondary | ICD-10-CM

## 2022-07-04 DIAGNOSIS — F3162 Bipolar disorder, current episode mixed, moderate: Secondary | ICD-10-CM

## 2022-07-04 DIAGNOSIS — F4000 Agoraphobia, unspecified: Secondary | ICD-10-CM

## 2022-07-04 DIAGNOSIS — F308 Other manic episodes: Secondary | ICD-10-CM | POA: Diagnosis not present

## 2022-07-04 DIAGNOSIS — G3184 Mild cognitive impairment, so stated: Secondary | ICD-10-CM

## 2022-07-04 DIAGNOSIS — Z79899 Other long term (current) drug therapy: Secondary | ICD-10-CM

## 2022-07-04 MED ORDER — LITHIUM CARBONATE 150 MG PO CAPS: 150.0000 mg | ORAL_CAPSULE | Freq: Every day | ORAL | 1 refills | Status: DC

## 2022-07-04 MED ORDER — LITHIUM CARBONATE 300 MG PO CAPS: 600.0000 mg | ORAL_CAPSULE | Freq: Every day | ORAL | 0 refills | Status: DC

## 2022-07-04 NOTE — Progress Notes (Signed)
Kristen Leon [id number] 05-15-1966 56 y.o.  Video Visit via My Chart  I connected with pt by My Chart video and verified that I am speaking with the correct person using two identifiers.   I discussed the limitations, risks, security and privacy concerns of performing an evaluation and management service by My Chart  and the availability of in person appointments. I also discussed with the patient that there may be a patient responsible charge related to this service. The patient expressed understanding and agreed to proceed.  I discussed the assessment and treatment plan with the patient. The patient was provided an opportunity to ask questions and all were answered. The patient agreed with the plan and demonstrated an understanding of the instructions.   The patient was advised to call back or seek an in-person evaluation if the symptoms worsen or if the condition fails to improve as anticipated.  I provided 30 minutes of video time during this encounter.  The patient was located at home and the provider was located office. Session started 1030 until 1100  Subjective:   Patient ID:  Kristen Leon is a 56 y.o. (DOB 02-May-1966) female.  Chief Complaint:  Chief Complaint  Patient presents with   Follow-up   Manic Behavior   Sleeping Problem     Medication Refill Pertinent negatives include no weakness.     Kristen Leon presents  today for follow-up ofBipolar disorder, generalized anxiety disorder, nocturnal panic attacks, history of OCD, and mild cognitive impairment and worsening driving anxiety.  She has a goal of getting off the Seroquel in hopes of improving memory.  at visit August 06, 2019.  We needed to start a mood stabilizer and discussed variety of options and decided to start with Latuda.  We started at 20 mg and the plan was to increase gradually to 80 mg daily.  If that was successful this follow-up was going to lead to a potential reduction in an attempt to wean  Seroquel. Never got up to 80 mg Latuda.    Did not noticed much of a difference.  visit September 03, 2019.  The following changes were made: Increase vitamin D from 5000 units daily to 10K units daily Start Latuda  80 mg daily Start reduction in Seroquel next week and reduce once every 3 weeks. Reduced Seroquel from 600 to 200 mg HS.  At first had sleep and mood problems and angry.  Added in  gabapentin 300 mg total week ago and sleep better.  Able to sleep on her own.  Mood is better also.  visit October 12, 2019.  The following changes were made: Increase vitamin D from 5000 units daily to 10K units daily Continue Latuda  80 mg daily Continue gradual reduction in Seroquel next week and reduce once every 3 to 4 weeks by 50 mg. Trying to get rid of gabapentin and Seroquel for cognitive reasons primarily  seen December 16, 2019.  The following was noted: Down to Seroquel 150 HS for 3 weeks.  Quality of sleepy slightly off.  How long on this dose?  Tends to wake 30 min earlier than usual.  Bought magnesium.   Feels fine in the day.  Slightly less harried and mentally cloudy with reduction in Seroquel. Likes the [place] as mood medication.   Overall mood and anxiety are under control.  Other concern is focus and memory. Wants to get off gabapentin and Seroquel for that reason.  Feels the meds may increase risk of dementia.  Overall  she has seen some cognitive improvement since the reduction in Seroquel as noted. She was in courage to try reducing the Seroquel further gradually.  She was encouraged to leave the gabapentin at 300 mg daily because it was helpful.  seen March 14, 2020.  She was not doing well.  The following was noted: Couldn't sleep without Seroquel 200 mg.  Felt unstable and anxious.  For weeks felt she still struggled at 200 mg Seroquel and raised the dose all the way back up to where she started bc felt desperate and was on edge.   Early 2000's not on Seroquel and had a lot of  anxiety and obsessive thought.  It helps anxiety. Mood better on Seroquel and feels better physically and mentally.  A little down with stress and failure of switch to [place].  little anger but not much depression and no other sx of mania.    Driving anxiety and sense unreality resolved.  Fine with driving now. The following changes were made: Continue vitamin D from 5000 units daily to 10K units daily Wean Latuda to 40 mg for a week and then stop it. Then start Saphris 5mg  HS and reduce Seroquel 400 mg at night for 5 days, then increase Saphris 10 mg at night and reduce Seroquel to 200 mg at night for 5 days, then increase Saphris to 15 HS and stop sEroquel.  04/12/2020 appointment the following is noted: As noted pt has been wanting off Seroquel so med changes last visit wre intended to help. More anxious with reduced gabapentin and Seroquel.  Hard to stop Seroquel bc both anxiety and insomnia and using 100 mg Seroquel.  Missed a day of work over it yesterday.  More sleep with Saphris than Latuda.  No SE with saphris. Anxiety included fidgety. Hasn't tried Saphris 15 mg daily yet.   Has felt in a little less foggy headed with reduced Seroquel and gabapentin.  Down on gabapentin for a while now and only a little change with that reduction.  A little better focus and memory now. Neuropsych testing next week. Plan: Increase Saphris 15 mg HS and stop Seroquel again.  Can reduce Seroquel to 50 or even lower if needed.    05/09/2020 phone call with the following noted: Patient had to go back up to 600 mg Seroquel at hs, she has tried the last few nights at 500 mg. She's been unsuccessful tapering down and just wanted to have 200 mg and 100 mg tablets available to try and get it lowered.   07/07/2020 appointment with the following noted: Got close to off Seroquel but felt untethered and trouble with sleep initial and terminal. 5-6 hours on low dose Seroquel. Never tried Saphris 15 mg for reasons that are  unclear.  Stopped shortly after the phone session from last time. Started Seroquel by 2002 and gabapentin in 2003.  Finds it very difficult to get off the meds and worry over instability. Plan: Start Saphris 10 mg at night and reduce Seroquel to 400 mg nightly for 4 nights, Then increase Saphris to 15 mg at night and reduce Seroquel to 200 mg for 4 nights, Then reduce Seroquel to 100 mg and continue Saphris at 15 mg nightly for 4 nights, Then stop Seroquel and if insomnia occurs, increase Saphris to 20 mg at night.  07/19/2020 phone call.  Restless legs on Saphris.  Given instructions to change to loxapine 100 mg nightly. 07/25/2020 phone call patient stating she wanted to try to continue  Saphris but have treatment for the restless legs side effects. Prescription sent for ropinirole  09/13/2020 appointment with the following noted: Not taking Saphris.  At 10 mg HS CO jitteriness and anxiety the next day. Never took loxapine. Wonders about taking loxapine now.  Plan: Give her this change in instructions for transition from Seroquel to loxapine: Start loxapine 25 mg capsule 1 at night and reduce Seroquel to 400 mg nightly for 4 nights, Then increase loxapine to 2 capsules at night and reduce Seroquel to 200 mg for 4 nights, Then reduce Seroquel to 100 mg and increase loxapine to 3 capsules nightly for 4 nights, Then stop Seroquel and if insomnia occurs, increase loxapine to 4 capsules at night and notify MD.   Multiple phone calls since the last visit including the following. 10/24/2020:Telephone call to patient.  She experiences an increase in anxiety whenever she reduces the Seroquel.  She is just started the transition from Seroquel to loxapine.  She is tolerating the loxapine well.  Encouraged her to just use the lorazepam and transition from Seroquel to loxapine.  It is hoped and expected that the loxapine will effectively manage anxiety as the Seroquel when she is on the full dose.  She  agrees to the plan.  Lorazepam prescription sent in. 11/17/2020: RTC  See prior note.  Took 50 mg Seroquel last night.  Realized she made a mistake in dosing.  She stopped from 200 mg Seroquel instead of reducing to 100 mg as instructed.  Had gotten up to 4 loxapine without seroquel was jittery and like she'd come out of her skin and took lorazepam.  Still some problems with memory.  Last night took 50 mg Seroquel and was a little better. Much better today than yesterday.   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   Memory testing with visuospatial problems and was worked up over that.   Take 4 loxapine and start 75 mg Seroquel and taper from there more slowly. Appt 11/22/20  11/22/2020:Pt requesting Rx for Loxapine 25 mg 4/d  30 day and Seroquel 200 mg 3/d   30 day and Lorazepam 0.5 mg 1-2 tab every 8 hrs PRN for anxiety  @ [facility] on file. Apt RS from 12/21 to 2/9 and on canc list. Pt going out of town 12/27-1/3 asking for Rx before 12/27.  11/22/2020 MD response: I sent in the prescription for lorazepam.  Patient is not on quetiapine 200 mg 3 daily and I will not prescribe that dose.  Ask her what dose of quetiapine she is actually taking.  Also the insurance will not cover 4 loxapine capsules daily.  If she is taking 4 I will need to change it to 2 of the 50 mg capsules.  Please verify her current dose of loxapine and quetiapine.  I am weaning her off of quetiapine she should not be on that high of a dose of quetiapine. 11/23/2020:Pt called back stating she would like to stop Loxapine completely. She is having blurred vision and jittery. She did not pick up Loxapine Rx. She is currently taking quetiapine 100 mg (4-25 mg tabs daily). Requesting to go back to 600 mg daily. Requesting Rx for this.  Agreed 12/01/2020:[facility] called to request that you decrease her gabapentin and prescribe Buspar.  Please call to discuss this.  appt 01/11/21.  MD  response: Took buspar years ago and wants to try it again and reduce gabapentin.  Currently  on 1200 mg HS.  Reduce to 900 mg gabapentin.   Start buspirone 5 mg twice daily, and increase to 15 mg BID. Disc SE.  She agrees.  01/11/2021 appointment with the following noted: Completely off gabapentin for a couple of weeks maybe.  Initially anxiety a little high in AM.  Increased buspirone. Had increase anxiety when off Seroquel.  Was off about 4 days and memory was not better.  Anxiety is better now. Anxiety is a little higher off gabapentin but not unmanageable.  Sleep is not as good off the gabapentin.  More initial insomnia and takes melatonin to manage. Not marked difference with cognition off gabapentin. Read about antipsychotics reducing brain volume and still wants to stop Seroquel despite multiple failed attempts. Wants to switch from Seroquel to trazodone.  Plan: OK trial Seroquel reduction to 400 mg daily with no other med changes.  02/17/2021 appointment with the following noted: Stopped buspirone without change. Stopped gabapentin. Reduced Seroquel to 400 mg HS. She is convinced antipsychotic is causing brain loss.  She wants to be off antipsychotics bc of this concern and risk of dementia.  Don't think fears of dementia are unfounded. Doesn't believe she has psychotic sx. Wants to retry lithium and then trazodone in place of Seroquel.  Took lithium a long time.   Acknowledges prior failures weaning Seroquel multiple times but thinks maybe it was too fast.  Sleep most of the night and sleep less deep than 600 mg Seroquel.  Also less deep without gabapentin.  Altogether 6-7 hours but leaves the TV on.   In bed 8 watching TV and doesn't try to sleep until 1030. Plan: She  failed attempts to get off Seroquel multiple times. OK trial Seroquel reduction to 300 mg daily for 3-4 weeks, then 200 mg for 3-4 weeks. Disc withdrawal effects from Seroquel. Start lithium 300 mg nightly for a week  then 600 mg nightly. Wait a week and get blood level.    04/12/2021 appointment with the following noted: Did get down to Seroquel 200 mg HS and up to lithium 600 HS and trazodone 100 mg HS. Sleep more interrupted but 6-7 hours with poor quality and less well rested with change.  Mood really good with lithium and maybe better. Often had rapid cycling and it seemed to stop. But "sleep sucks" and feels it affects her during the day Still takes mirtazapine 15 mg and added trazodone.  Sleep worse without trazodone. No SE with lithium. Cognition  more clear with better focus with less Seroquel.   Patient reports stable mood and denies depressed or irritable moods.  Patient denies any recent difficulty with anxiety, surprisingly. Denies appetite disturbance.  Patient reports that energy and motivation have been good.  Patient denies any difficulty with concentration.  Patient denies any suicidal ideation.  Plan: Continue Seroquel  200 mg for 3-4 weeks. Continue lithium 600 mg nightly. Check another level  Stop mirtazapine and trazodone Doxepin 10-30 mg HS   04/24/2021 phone call complaining of obsessive thinking and wanting medicine sent into the pharmacy. MD response: Her case is very complicated because she has complained of side effects from atypicals and worried about cognitive side effects of benzodiazepines.  She is bipolar and therefore we cannot use SSRIs.  She does not want to take atypical antipsychotics.  She is on a low dose of Seroquel now and I am sure does not want to increase the dosage back up.  She has a as needed prescription for lorazepam  she can use that.  I will not make any other major med changes over the phone outside of a scheduled appointment.  If the symptoms are bothersome enough however going to the cancellation list and we will attempt to get her into an appointment sooner.  06/01/2021 appointment with the following noted: Thinks she stopped trazodone and ? Took doxepin.   She vaguely think she tried it and it didn't help sleep. Mood more stable on lithium 600 HS.  Prior mood cycling into dep and mania have resolved on it.. Still problems with her sleep. Increased Seroquel to 400 mg on her own for sleep added gabapentin 600 and sleep is better but wants off both of them. Still on mirtazapine 30.   Cholecystectomy scheduled for August. Had stopped Seroquel and then had intrusive thoughts which have now resolved.  At the time she called they were bad. Plan: Continue lithium 600 mg nightly. Check another level before next appt Wants to try again to taper quetiapine, will proceed as follows: Stop mirtazapine DT NR Retry Doxepin at higher dose of 50 mg up to 100 mg HS Once sleeping well then try to taper Seroquel very slowly such as reduce by 50 mg every 2 to 3 weeks or even slower if necessary.  09/05/2021 appointment with the following noted: Got down to Seroquel 200 and taking gabapentin 600 mg HS. Never tried 50-100 mg for sleep. Off track for 6 weeks without enough sleep.  Cholecystectomy recently. On phone with BF too much at night talking to him bc of his work schedule.   More irritable and tired.   Older cousin moved in for 3 mos and stressed by that and esp bc he brought a dog.  He's taking advantage of her and brought his GF. Stopped lithium AMA bc "I was afraid of it".   I want to go back on lithium bc it helped and retry doxepin at higher dose and try wean Seroquel.  Then wean gabapenin.   Was more focused and better mood stability on lithium. Mirtazapine does not help sleep much. Paranoid about being hurt if she dates and no history of it.  Develop a whole story about what might happen. Plan: Stop mirtazapine DT NR Retry Doxepin at higher dose of 50 mg up to 100 mg HS Once sleeping well then try to taper Seroquel very slowly such as reduce by 50 mg every 2 to 3 weeks or even slower if necessary. Restart lithium 600 mg nightly. Check another level  before next appt Don't stop meds AMA.   Call if there's a problem.  11/16/21 appt noted: Second week of Seroquel 150 mg daily. Is sleeping currently but not the same quality but not terrible.  On doxepin 75 mg HS Mood is good.  Likes the lithium better than Seroquel for mood.  Fewer hypomanic episones and more stable with less anger. Never got lithium level.  Says she'll do so. Rare lorazepam. No SE right now. Asks about Ozempic. Plan: continue Doxepin at higher dose of 50 mg up to 100 mg HS Once sleeping well then try to taper Seroquel very slowly such as reduce by 50 mg every 2 to 3 weeks or even slower if necessary. Continue lithium 600 mg nightly. Get lithium level ASAP.  Then repeat lithium level with every 20 # of weight loss Don't stop meds AMA.   Call if there's a problem.  01/18/2022 appointment with the following noted: Ran out of doxepin and says refill rejected  for 3 weeks. Was feeling kind of down. Down to Seroquel 25 mg HS for 2 + weeks. Still sleeping with some awakening.  Surprising good sleep.  6 and 1/2 hours of sleep and not drowsy daytime. Mood is fine.   Not sure if doxepin helped sleep but would like to have it. Patient reports stable mood and denies depressed or irritable moods.  Patient denies any recent difficulty with anxiety.  Patient denies difficulty with sleep initiation or maintenance. Denies appetite disturbance.  Patient reports that energy and motivation have been good.  Patient denies any difficulty with concentration.  Patient denies any suicidal ideation. Plan no med chages  04/17/22 appt noted: Takes gabapentin prn sleep and it helps. Felt dull on lithium and U frequency so reduced it to 1of 300 mg  lithium daily. Down to Seroquel 25 mg HS and Sonata on occasion. Feels less dull with less lithium.  Thinks she was more depressed on lihtium at 600 mg daily.  Feels better the last 5 days.  Less urinary frequency. Sleep is less deep with less Seroquel  and shorter duration 6 hours.  It's not terrrible but not great. 100 times better cognitively with less Seroquel.   Noticed it at work. Anxiety in check and she's surprised. Doesn't want med changes today. Plan; Cogniton better with less serotquel 25 mg HS. She wants to try lorazepam 0.5 mg HS in place of Sonata to see if she can sleep longer. Continue lithium 600 mg nightly was recommended but she dropped it to 300 mg nightly because she complained of dullness at the 600 mg and perhaps even some depression.  We agreed on a compromise of 450 mg nightly.  Even that will be below the usual therapeutic range and there is significant risk of mood instability and relapse with this and she accepts that risk.   07/04/22 appt noted: Using lorazepam about every 3 mos. Taking Sonata more about 1/2 the time. Taking quetiapine 25 mg nightly and lithium 450 mg daily. Don't think meds working out for her.  Not taking lithium enough.  More hypomania and sleep not enough nor restorative. Hypomania includes talking to herself a lot and tell funny stories to herself. Excitable mood and speech. More energy and need to burn it off.  Therapist noticed.   EMA.  About 5 hours sleep since off more Seroquel. On more lithium had less hypomania but felt flat on more lithium.  Still frequent urination.   Saw a urologist. Ativan not helpful sleep  When manic feels more reckless, spending, talk to herself, driven, reduced sleep, irritable and angry.  Past Psychiatric Medication Trials: Vraylar 3 SE, Abilify, brief Latuda ? effect,  Depakote NR, carbamazepine questionable side effects, lamotrigine lost response, Trileptal,  lithium , clozapine, olanzapine , Seroquel 600, risperidone 1 mg twice daily  , Saphris CO anxiety mirtazapine,   Gabapentin cog SE,  Trazodone 200, doxepin 30 not sedating,  Lorazepam 1 mg poor response for sleep  several SSRIs, clomipramine had vision side effects,   failed an attempt to switch  from Seroquel to [place] early 2020  Failed switch from Seroquel to [place], Saphris, Vraylar and loxapine No psych hosp.  Review of Systems:  Review of Systems  Cardiovascular:  Negative for palpitations.  Genitourinary:  Positive for frequency.  Neurological:  Negative for dizziness, tremors and weakness.  Psychiatric/Behavioral:  Positive for decreased concentration and sleep disturbance. Negative for agitation, behavioral problems, confusion, dysphoric mood, hallucinations, self-injury and suicidal ideas. The patient  is not nervous/anxious and is not hyperactive.     Medications: I have reviewed the patient's current medications.  Current Outpatient Medications  Medication Sig Dispense Refill   Acetylcysteine (NAC) 600 MG CAPS Take 1 capsule (600 mg total) by mouth daily. 90 capsule 1   atenolol (TENORMIN) 50 MG tablet Take 50 mg by mouth at bedtime.     B Complex Vitamins (VITAMIN B-COMPLEX) TABS Take 1 tablet by mouth at bedtime.     Calcium Carbonate-Vitamin D (CALCIUM-D PO) Take by mouth at bedtime.     cefadroxil (DURICEF) 1 g tablet Take 1 tablet (1 g total) by mouth 2 (two) times daily. 14 tablet 0   Cholecalciferol (VITAMIN D3) 5000 units CAPS Take 1 capsule by mouth at bedtime.     folic acid (FOLVITE) A999333 MCG tablet Take 400 mcg by mouth at bedtime.     levothyroxine (SYNTHROID, LEVOTHROID) 112 MCG tablet Take 112 mcg by mouth at bedtime.     linaclotide (LINZESS) 290 MCG CAPS capsule Take 290 mcg by mouth at bedtime.     lithium carbonate 150 MG capsule Take 1 capsule (150 mg total) by mouth daily. 90 capsule 0   lithium carbonate 150 MG capsule Take 1 capsule (150 mg total) by mouth daily. (Patient taking differently: Take 150 mg by mouth daily. Currently taking 450 mg daily) 90 capsule 1   LORazepam (ATIVAN) 0.5 MG tablet Take 1-2 tablets (0.5-1 mg total) by mouth every 8 (eight) hours as needed for anxiety. 50 tablet 0   MAGNESIUM PO Take 400 mg by mouth at bedtime.      memantine (NAMENDA) 10 MG tablet Take 1 tablet (10 mg total) by mouth 2 (two) times daily. 180 tablet 0   metroNIDAZOLE (FLAGYL) 500 MG tablet Take 1 tablet (500 mg total) by mouth 2 (two) times daily. 14 tablet 0   QUEtiapine (SEROQUEL) 25 MG tablet Take 1 tablet (25 mg total) by mouth at bedtime. 90 tablet 0   tretinoin (RETIN-A) 0.025 % cream Apply topically at bedtime. face     zaleplon (SONATA) 10 MG capsule Take 1 capsule (10 mg total) by mouth at bedtime as needed for sleep. 90 capsule 0   gabapentin (NEURONTIN) 600 MG tablet Take 300 mg by mouth at bedtime. (Patient not taking: Reported on 04/16/2022)     lithium carbonate 300 MG capsule Take 2 capsules (600 mg total) by mouth daily. 180 capsule 0   No current facility-administered medications for this visit.    Medication Side Effects: None  Allergies: No Known Allergies  Past Medical History:  Diagnosis Date   Anxiety    Bipolar 1 disorder ([place])    followed by dr c. cottle   Carpal tunnel syndrome on both sides    CKD (chronic kidney disease), stage II    followed by pcp   Endometriosis    Hypothyroidism, postablative    endocrinologist--- dr c. Barnet Pall--- dx toxic multinodular thyroid 2000 w/ hyperthyroidism s/p RAI  2000, 2002, and 2004;  post hypothryoid in 2008;   last bx 2012 left side, benign per pt   Irritable bowel syndrome with constipation    followed by dr w. Glennon Hamilton ([facility])   MDD (major depressive disorder)    Multinodular thyroid    in 2000 dx toxic post ablative   NAFLD (nonalcoholic fatty liver disease)    OSA (obstructive sleep apnea)    05-18-2021  per pt study done approx. 2018, told moderat OSA,  does uses cpap  Tachycardia    cardiologist--- Guerry Bruin NP @ [facility] in [place],  w/ palpitations, controlled w/ atenolol;  pt has had work-up per note echo 2014 normal ,  event monitor 2018 showedST w/ mild ST no arrhythmia,  ETT 02/ 2022 normal no ischemia, ef 67%   Wears contact lenses     Family History   Problem Relation Age of Onset   Thyroid disease Mother    Diabetes Mother    Hypertension Mother     Social History   Socioeconomic History   Marital status: Divorced    Spouse name: Not on file   Number of children: Not on file   Years of education: Not on file   Highest education level: Not on file  Occupational History   Not on file  Tobacco Use   Smoking status: Former    Years: 5.00    Types: Cigarettes    Quit date: 2000    Years since quitting: 23.6   Smokeless tobacco: Never  Vaping Use   Vaping Use: Never used  Substance and Sexual Activity   Alcohol use: Yes    Alcohol/week: 1.0 standard drink of alcohol    Types: 1 Standard drinks or equivalent per week   Drug use: Never   Sexual activity: Not on file  Other Topics Concern   Not on file  Social History Narrative   Not on file   Social Determinants of Health   Financial Resource Strain: Not on file  Food Insecurity: Not on file  Transportation Needs: Not on file  Physical Activity: Not on file  Stress: Not on file  Social Connections: Not on file  Intimate Partner Violence: Not on file    Past Medical History, Surgical history, Social history, and Family history were reviewed and updated as appropriate.   Please see review of systems for further details on the patient's review from today.   Objective:   Physical Exam:  There were no vitals taken for this visit.  Physical Exam Neurological:     Mental Status: She is alert and oriented to person, place, and time.     Cranial Nerves: No dysarthria.  Psychiatric:        Attention and Perception: She is inattentive.        Mood and Affect: Mood is not anxious or depressed.        Speech: Speech normal. Speech is not rapid and pressured or slurred.        Behavior: Behavior is cooperative.        Thought Content: Thought content is not paranoid or delusional. Thought content does not include homicidal or suicidal ideation. Thought content does  not include suicidal plan.        Cognition and Memory: She does not exhibit impaired recent memory or impaired remote memory.        Judgment: Judgment normal.     Comments: Insight and judgment appear good. Hypomania No significant pressured speech or flight of ideas     Lab Review:     Component Value Date/Time   NA 136 04/10/2021 1035   K 4.3 04/10/2021 1035   CL 97 04/10/2021 1035   CO2 25 04/10/2021 1035   GLUCOSE 77 04/10/2021 1035   BUN 13 04/10/2021 1035   CREATININE 0.94 04/10/2021 1035   CALCIUM 9.7 04/10/2021 1035       Component Value Date/Time   WBC 6.6 05/24/2021 0630   RBC 3.79 (L) 05/24/2021 0630   HGB 11.3 (  L) 05/24/2021 0630   HCT 35.0 (L) 05/24/2021 0630   PLT 325 05/24/2021 0630   MCV 92.3 05/24/2021 0630   MCH 29.8 05/24/2021 0630   MCHC 32.3 05/24/2021 0630   RDW 13.2 05/24/2021 0630    Lithium Lvl  Date Value Ref Range Status  12/05/2021 0.6 0.5 - 1.2 mmol/L Final    Comment:    A concentration of 0.5-0.8 mmol/L is advised for long-term use; concentrations of up to 1.2 mmol/L may be necessary during acute treatment.                                  Detection Limit = 0.1                           <0.1 indicates None Detected   12/05/2021 lithium level 0.6 on 600 mg daily stable  04/10/2021 lithium level 0.8 on 600 mg daily.  No results found for: "PHENYTOIN", "PHENOBARB", "VALPROATE", "CBMZ"   Took lab orders to PCP from visit in September and reports results: B12 >2000 Folate > 20 D 43.5 Iron TIBC 297 (250-450)  UIBC 156 (131-425)  Iron 141 (27-159)  Iron saturation 47 (15-55)   .res Assessment: Plan:    Bipolar 1 disorder, mixed, moderate (HCC) - Plan: lithium carbonate 300 MG capsule, Lithium level  Hypomania (HCC)  Generalized anxiety disorder  Mixed obsessional thoughts and acts  Insomnia due to mental condition  MCI (mild cognitive impairment)  Agoraphobia  Lithium use - Plan: lithium carbonate 300 MG capsule    Greater than 50% of 30-minute video time with patient was spent on counseling and coordination of care. We discussed nature of her psychiatric diagnoses .   She remains motivated to get off Seroquel because she believes is causing cognitive problems and fears brain matter loss.  Mood stable so far with less Seroquel.. Benefit from lithium but complaining of dullness at 600 mg daily.  She reduced AMA to 300 mg daily and disc this is very low dose and puts her at risk  Bipolar disorder Was worse with reduction in Seroquel until addtion of lithium has helped so far.  She feels lithium has provided better mood stability than does Seroquel. Failed switch from Seroquel to Jordan, Saphris, Vraylar and loxapine   Counseled patient regarding potential benefits, risks, and side effects of lithium to include potential risk of lithium affecting thyroid and renal function.  Discussed need for periodic lab monitoring to determine drug level and to assess for potential adverse effects.  Counseled patient regarding signs and symptoms of lithium toxicity and advised that they notify office immediately or seek urgent medical attention if experiencing these signs and symptoms.  Patient advised to contact office with any questions or concerns.  Hypomania disc extensively.  Currently hypomanic because of a low dose of lithium.  The flatness has gone away but she understands her bipolar symptoms need to be under good better control and agrees to increase the lithium but only slightly because she felt too flat at 600 mg daily.  We discussed alternatives including switching to an antipsychotic type mood stabilizer.  She has been on all the traditional mood stabilizers that are not antipsychotics.  The simplest solution would be to increase the lithium and she agrees to do so.  Cogniton complaints resolved with less serotquel 25 mg HS. But not sleeping.   Option switch to  Thorazine 25 mg for less hangover.  She will call in  a couple of weeks and if sleep is not better we will switch to the Thorazine.  She understands this is not an antipsychotic at these low dosages but it can have other side effects like dizziness or drowsiness.  Increase lithium to 450/600 mg QOD bc hypomania at 450 daily and dullness at 600 mg daily. Check lithium level in 2 weks.  She gets routine labs tomorrow. 12/05/2021 lithium level 0.6 on 600 mg daily stable  Don't stop meds AMA.   Call if there's a problem.  Sleep hygiene incl no bed without sleep.  Sleep options Belsomra, Dayvigo, doxepin, Rozerem, hydroxyzine.  Prefer avoid BZ and bc cognitive concerns she has. Wants Sonata rarely.  Option to stop memantine 10 BID off label for cognitive problems.  Because the cognitive problems resolved with reduction in Seroquel and lithium and the onset of hypomania.  Discussed potential metabolic side effects associated with atypical antipsychotics, as well as potential risk for movement side effects. Advised pt to contact office if movement side effects occur.   Thyroid was normal recently. B12 and folate are normal D is OK but goal with her should be 50s-60s Disc reasons in detail including Dr. Missy Sabins recommendation. .  Disc neuroprotective literature on lithium. Dr. Olene Floss article  FU 8 weeks  Lynder Parents, MD, DFAPA   Please see After Visit Summary for patient specific instructions.  No future appointments.    Orders Placed This Encounter  Procedures   Lithium level       -------------------------------

## 2022-07-06 ENCOUNTER — Other Ambulatory Visit: Payer: Self-pay | Admitting: Psychiatry

## 2022-07-06 DIAGNOSIS — F5105 Insomnia due to other mental disorder: Secondary | ICD-10-CM

## 2022-07-06 DIAGNOSIS — F3162 Bipolar disorder, current episode mixed, moderate: Secondary | ICD-10-CM

## 2022-07-11 ENCOUNTER — Encounter: Payer: Self-pay | Admitting: Internal Medicine

## 2022-07-30 ENCOUNTER — Other Ambulatory Visit: Payer: Self-pay | Admitting: Psychiatry

## 2022-07-30 ENCOUNTER — Telehealth: Payer: Self-pay | Admitting: Psychiatry

## 2022-07-30 DIAGNOSIS — G3184 Mild cognitive impairment, so stated: Secondary | ICD-10-CM

## 2022-07-30 NOTE — Telephone Encounter (Signed)
Pt called and said that she has increased the lithium to help her sleep. However she is still not sleeping. She would like to try thorazine 25 mg. If she starts that medicine she wants to know does she continue taking the 450 mg of lithium and 25 mg of seroquel with the the thorazine. Please give her a call at [phone]

## 2022-07-31 ENCOUNTER — Other Ambulatory Visit: Payer: Self-pay | Admitting: Psychiatry

## 2022-07-31 MED ORDER — CHLORPROMAZINE HCL 25 MG PO TABS: 25.0000 mg | ORAL_TABLET | Freq: Every evening | ORAL | 0 refills | Status: DC

## 2022-07-31 NOTE — Telephone Encounter (Signed)
Sent prescription to [facility] in [place].

## 2022-07-31 NOTE — Telephone Encounter (Signed)
Stop the Seroquel when she starts the Thorazine.  She can continue the lithium 450 mg daily.  I sent in prescription for Thorazine.

## 2022-07-31 NOTE — Telephone Encounter (Signed)
Please advise 

## 2022-08-01 NOTE — Telephone Encounter (Signed)
Pt informed

## 2022-09-19 ENCOUNTER — Other Ambulatory Visit: Payer: Self-pay | Admitting: Psychiatry

## 2022-09-19 DIAGNOSIS — F3162 Bipolar disorder, current episode mixed, moderate: Secondary | ICD-10-CM

## 2022-09-19 DIAGNOSIS — F5105 Insomnia due to other mental disorder: Secondary | ICD-10-CM

## 2022-09-25 ENCOUNTER — Ambulatory Visit (INDEPENDENT_AMBULATORY_CARE_PROVIDER_SITE_OTHER): Payer: Federal, State, Local not specified - PPO | Admitting: Clinical

## 2022-09-25 DIAGNOSIS — F3177 Bipolar disorder, in partial remission, most recent episode mixed: Secondary | ICD-10-CM
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
## 2022-10-03 ENCOUNTER — Ambulatory Visit (INDEPENDENT_AMBULATORY_CARE_PROVIDER_SITE_OTHER): Payer: Federal, State, Local not specified - PPO | Admitting: Psychiatry

## 2022-10-03 DIAGNOSIS — Z91199 Patient's noncompliance with other medical treatment and regimen due to unspecified reason: Secondary | ICD-10-CM

## 2022-10-03 NOTE — Progress Notes (Signed)
No show

## 2022-10-09 ENCOUNTER — Encounter: Payer: Self-pay | Admitting: Psychiatry

## 2022-10-09 ENCOUNTER — Telehealth (INDEPENDENT_AMBULATORY_CARE_PROVIDER_SITE_OTHER): Payer: Federal, State, Local not specified - PPO | Admitting: Psychiatry

## 2022-10-09 DIAGNOSIS — R7989 Other specified abnormal findings of blood chemistry: Secondary | ICD-10-CM

## 2022-10-09 DIAGNOSIS — F5105 Insomnia due to other mental disorder: Secondary | ICD-10-CM

## 2022-10-09 DIAGNOSIS — F411 Generalized anxiety disorder: Secondary | ICD-10-CM | POA: Diagnosis not present

## 2022-10-09 DIAGNOSIS — F422 Mixed obsessional thoughts and acts: Secondary | ICD-10-CM

## 2022-10-09 DIAGNOSIS — F3177 Bipolar disorder, in partial remission, most recent episode mixed: Secondary | ICD-10-CM | POA: Diagnosis not present

## 2022-10-09 DIAGNOSIS — G3184 Mild cognitive impairment, so stated: Secondary | ICD-10-CM

## 2022-10-09 DIAGNOSIS — Z79899 Other long term (current) drug therapy: Secondary | ICD-10-CM

## 2022-10-09 NOTE — Progress Notes (Signed)
Kristen Leon [id number] [birth year] 56 y.o.  Video Visit via My Chart  I connected with pt by My Chart video and verified that I am speaking with the correct person using two identifiers.   I discussed the limitations, risks, security and privacy concerns of performing an evaluation and management service by My Chart  and the availability of in person appointments. I also discussed with the patient that there may be a patient responsible charge related to this service. The patient expressed understanding and agreed to proceed.  I discussed the assessment and treatment plan with the patient. The patient was provided an opportunity to ask questions and all were answered. The patient agreed with the plan and demonstrated an understanding of the instructions.   The patient was advised to call back or seek an in-person evaluation if the symptoms worsen or if the condition fails to improve as anticipated.  I provided 30 minutes of video time during this encounter.  The patient was located at home and the provider was located office. Session started [phone]  Subjective:   Patient ID:  Kristen Leon is a 56 y.o. (DOB [birth year]) female.  Chief Complaint:  Chief Complaint  Patient presents with   Follow-up    Bipolar 1 disorder, mixed, moderate ([place])   Anxiety     Medication Refill Pertinent negatives include no chest pain.  Anxiety Symptoms include decreased concentration. Patient reports no chest pain, confusion, dizziness, nervous/anxious behavior, palpitations or suicidal ideas.       Kristen Leon presents  today for follow-up ofBipolar disorder, generalized anxiety disorder, nocturnal panic attacks, history of OCD, and mild cognitive impairment and worsening driving anxiety.  She has a goal of getting off the Seroquel in hopes of improving memory.  at visit August 06, 2019.  We needed to start a mood stabilizer and discussed variety of options and decided to start with Latuda.  We  started at 20 mg and the plan was to increase gradually to 80 mg daily.  If that was successful this follow-up was going to lead to a potential reduction in an attempt to wean Seroquel. Never got up to 80 mg Latuda.    Did not noticed much of a difference.  visit September 03, 2019.  The following changes were made: Increase vitamin D from 5000 units daily to 10K units daily Start Latuda  80 mg daily Start reduction in Seroquel next week and reduce once every 3 weeks. Reduced Seroquel from 600 to 200 mg HS.  At first had sleep and mood problems and angry.  Added in  gabapentin 300 mg total week ago and sleep better.  Able to sleep on her own.  Mood is better also.  visit October 12, 2019.  The following changes were made: Increase vitamin D from 5000 units daily to 10K units daily Continue Latuda  80 mg daily Continue gradual reduction in Seroquel next week and reduce once every 3 to 4 weeks by 50 mg. Trying to get rid of gabapentin and Seroquel for cognitive reasons primarily  seen December 16, 2019.  The following was noted: Down to Seroquel 150 HS for 3 weeks.  Quality of sleepy slightly off.  How long on this dose?  Tends to wake 30 min earlier than usual.  Bought magnesium.   Feels fine in the day.  Slightly less harried and mentally cloudy with reduction in Seroquel. Likes the [place] as mood medication.   Overall mood and anxiety are under control.  Other concern is  focus and memory. Wants to get off gabapentin and Seroquel for that reason.  Feels the meds may increase risk of dementia.  Overall she has seen some cognitive improvement since the reduction in Seroquel as noted. She was in courage to try reducing the Seroquel further gradually.  She was encouraged to leave the gabapentin at 300 mg daily because it was helpful.  seen March 14, 2020.  She was not doing well.  The following was noted: Couldn't sleep without Seroquel 200 mg.  Felt unstable and anxious.  For weeks felt she still  struggled at 200 mg Seroquel and raised the dose all the way back up to where she started bc felt desperate and was on edge.   Early 2000's not on Seroquel and had a lot of anxiety and obsessive thought.  It helps anxiety. Mood better on Seroquel and feels better physically and mentally.  A little down with stress and failure of switch to [place].  little anger but not much depression and no other sx of mania.    Driving anxiety and sense unreality resolved.  Fine with driving now. The following changes were made: Continue vitamin D from 5000 units daily to 10K units daily Wean Latuda to 40 mg for a week and then stop it. Then start Saphris 5mg  HS and reduce Seroquel 400 mg at night for 5 days, then increase Saphris 10 mg at night and reduce Seroquel to 200 mg at night for 5 days, then increase Saphris to 15 HS and stop sEroquel.  04/12/2020 appointment the following is noted: As noted pt has been wanting off Seroquel so med changes last visit wre intended to help. More anxious with reduced gabapentin and Seroquel.  Hard to stop Seroquel bc both anxiety and insomnia and using 100 mg Seroquel.  Missed a day of work over it yesterday.  More sleep with Saphris than Latuda.  No SE with saphris. Anxiety included fidgety. Hasn't tried Saphris 15 mg daily yet.   Has felt in a little less foggy headed with reduced Seroquel and gabapentin.  Down on gabapentin for a while now and only a little change with that reduction.  A little better focus and memory now. Neuropsych testing next week. Plan: Increase Saphris 15 mg HS and stop Seroquel again.  Can reduce Seroquel to 50 or even lower if needed.    05/09/2020 phone call with the following noted: Patient had to go back up to 600 mg Seroquel at hs, she has tried the last few nights at 500 mg. She's been unsuccessful tapering down and just wanted to have 200 mg and 100 mg tablets available to try and get it lowered.   07/07/2020 appointment with the following  noted: Got close to off Seroquel but felt untethered and trouble with sleep initial and terminal. 5-6 hours on low dose Seroquel. Never tried Saphris 15 mg for reasons that are unclear.  Stopped shortly after the phone session from last time. Started Seroquel by 2002 and gabapentin in 2003.  Finds it very difficult to get off the meds and worry over instability. Plan: Start Saphris 10 mg at night and reduce Seroquel to 400 mg nightly for 4 nights, Then increase Saphris to 15 mg at night and reduce Seroquel to 200 mg for 4 nights, Then reduce Seroquel to 100 mg and continue Saphris at 15 mg nightly for 4 nights, Then stop Seroquel and if insomnia occurs, increase Saphris to 20 mg at night.  07/19/2020 phone call.  Restless  legs on Saphris.  Given instructions to change to loxapine 100 mg nightly. 07/25/2020 phone call patient stating she wanted to try to continue Saphris but have treatment for the restless legs side effects. Prescription sent for ropinirole  09/13/2020 appointment with the following noted: Not taking Saphris.  At 10 mg HS CO jitteriness and anxiety the next day. Never took loxapine. Wonders about taking loxapine now.  Plan: Give her this change in instructions for transition from Seroquel to loxapine: Start loxapine 25 mg capsule 1 at night and reduce Seroquel to 400 mg nightly for 4 nights, Then increase loxapine to 2 capsules at night and reduce Seroquel to 200 mg for 4 nights, Then reduce Seroquel to 100 mg and increase loxapine to 3 capsules nightly for 4 nights, Then stop Seroquel and if insomnia occurs, increase loxapine to 4 capsules at night and notify MD.   Multiple phone calls since the last visit including the following. 10/24/2020:Telephone call to patient.  She experiences an increase in anxiety whenever she reduces the Seroquel.  She is just started the transition from Seroquel to loxapine.  She is tolerating the loxapine well.  Encouraged her to just use the  lorazepam and transition from Seroquel to loxapine.  It is hoped and expected that the loxapine will effectively manage anxiety as the Seroquel when she is on the full dose.  She agrees to the plan.  Lorazepam prescription sent in. 11/17/2020: RTC  See prior note.  Took 50 mg Seroquel last night.  Realized she made a mistake in dosing.  She stopped from 200 mg Seroquel instead of reducing to 100 mg as instructed.  Had gotten up to 4 loxapine without seroquel was jittery and like she'd come out of her skin and took lorazepam.  Still some problems with memory.  Last night took 50 mg Seroquel and was a little better. Much better today than yesterday.   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   Memory testing with visuospatial problems and was worked up over that.   Take 4 loxapine and start 75 mg Seroquel and taper from there more slowly. Appt 11/22/20  11/22/2020:Pt requesting Rx for Loxapine 25 mg 4/d  30 day and Seroquel 200 mg 3/d   30 day and Lorazepam 0.5 mg 1-2 tab every 8 hrs PRN for anxiety  [facility] [place]  [address] [place]. Apt RS from 12/21 to 2/9 and on canc list. Pt going out of town 12/27-1/3 asking for Rx before 12/27.  11/22/2020 MD response: I sent in the prescription for lorazepam.  Patient is not on quetiapine 200 mg 3 daily and I will not prescribe that dose.  Ask her what dose of quetiapine she is actually taking.  Also the insurance will not cover 4 loxapine capsules daily.  If she is taking 4 I will need to change it to 2 of the 50 mg capsules.  Please verify her current dose of loxapine and quetiapine.  I am weaning her off of quetiapine she should not be on that high of a dose of quetiapine. 11/23/2020:Pt called back stating she would like to stop Loxapine completely. She is having blurred vision and jittery. She did not pick up Loxapine Rx. She is currently taking quetiapine 100 mg (4-25 mg tabs daily). Requesting to go back to 600 mg  daily. Requesting Rx for this.  Agreed 12/01/2020:Miyonna called to request that you decrease her gabapentin and prescribe Buspar.  Please call  to discuss this.  appt 01/11/21.  MD response: Took buspar years ago and wants to try it again and reduce gabapentin.  Currently on 1200 mg HS.  Reduce to 900 mg gabapentin.   Start buspirone 5 mg twice daily, and increase to 15 mg BID. Disc SE.  She agrees.  01/11/2021 appointment with the following noted: Completely off gabapentin for a couple of weeks maybe.  Initially anxiety a little high in AM.  Increased buspirone. Had increase anxiety when off Seroquel.  Was off about 4 days and memory was not better.  Anxiety is better now. Anxiety is a little higher off gabapentin but not unmanageable.  Sleep is not as good off the gabapentin.  More initial insomnia and takes melatonin to manage. Not marked difference with cognition off gabapentin. Read about antipsychotics reducing brain volume and still wants to stop Seroquel despite multiple failed attempts. Wants to switch from Seroquel to trazodone.  Plan: OK trial Seroquel reduction to 400 mg daily with no other med changes.  02/17/2021 appointment with the following noted: Stopped buspirone without change. Stopped gabapentin. Reduced Seroquel to 400 mg HS. She is convinced antipsychotic is causing brain loss.  She wants to be off antipsychotics bc of this concern and risk of dementia.  Don't think fears of dementia are unfounded. Doesn't believe she has psychotic sx. Wants to retry lithium and then trazodone in place of Seroquel.  Took lithium a long time.   Acknowledges prior failures weaning Seroquel multiple times but thinks maybe it was too fast.  Sleep most of the night and sleep less deep than 600 mg Seroquel.  Also less deep without gabapentin.  Altogether 6-7 hours but leaves the TV on.   In bed 8 watching TV and doesn't try to sleep until 1030. Plan: She  failed attempts to get off Seroquel  multiple times. OK trial Seroquel reduction to 300 mg daily for 3-4 weeks, then 200 mg for 3-4 weeks. Disc withdrawal effects from Seroquel. Start lithium 300 mg nightly for a week then 600 mg nightly. Wait a week and get blood level.    04/12/2021 appointment with the following noted: Did get down to Seroquel 200 mg HS and up to lithium 600 HS and trazodone 100 mg HS. Sleep more interrupted but 6-7 hours with poor quality and less well rested with change.  Mood really good with lithium and maybe better. Often had rapid cycling and it seemed to stop. But "sleep sucks" and feels it affects her during the day Still takes mirtazapine 15 mg and added trazodone.  Sleep worse without trazodone. No SE with lithium. Cognition  more clear with better focus with less Seroquel.   Patient reports stable mood and denies depressed or irritable moods.  Patient denies any recent difficulty with anxiety, surprisingly. Denies appetite disturbance.  Patient reports that energy and motivation have been good.  Patient denies any difficulty with concentration.  Patient denies any suicidal ideation.  Plan: Continue Seroquel  200 mg for 3-4 weeks. Continue lithium 600 mg nightly. Check another level  Stop mirtazapine and trazodone Doxepin 10-30 mg HS   04/24/2021 phone call complaining of obsessive thinking and wanting medicine sent into the pharmacy. MD response: Her case is very complicated because she has complained of side effects from atypicals and worried about cognitive side effects of benzodiazepines.  She is bipolar and therefore we cannot use SSRIs.  She does not want to take atypical antipsychotics.  She is on a low dose of  Seroquel now and I am sure does not want to increase the dosage back up.  She has a as needed prescription for lorazepam she can use that.  I will not make any other major med changes over the phone outside of a scheduled appointment.  If the symptoms are bothersome enough however going to  the cancellation list and we will attempt to get her into an appointment sooner.  06/01/2021 appointment with the following noted: Thinks she stopped trazodone and ? Took doxepin.  She vaguely think she tried it and it didn't help sleep. Mood more stable on lithium 600 HS.  Prior mood cycling into dep and mania have resolved on it.. Still problems with her sleep. Increased Seroquel to 400 mg on her own for sleep added gabapentin 600 and sleep is better but wants off both of them. Still on mirtazapine 30.   Cholecystectomy scheduled for August. Had stopped Seroquel and then had intrusive thoughts which have now resolved.  At the time she called they were bad. Plan: Continue lithium 600 mg nightly. Check another level before next appt Wants to try again to taper quetiapine, will proceed as follows: Stop mirtazapine DT NR Retry Doxepin at higher dose of 50 mg up to 100 mg HS Once sleeping well then try to taper Seroquel very slowly such as reduce by 50 mg every 2 to 3 weeks or even slower if necessary.  09/05/2021 appointment with the following noted: Got down to Seroquel 200 and taking gabapentin 600 mg HS. Never tried 50-100 mg for sleep. Off track for 6 weeks without enough sleep.  Cholecystectomy recently. On phone with BF too much at night talking to him bc of his work schedule.   More irritable and tired.   Older cousin moved in for 3 mos and stressed by that and esp bc he brought a dog.  He's taking advantage of her and brought his GF. Stopped lithium AMA bc "I was afraid of it".   I want to go back on lithium bc it helped and retry doxepin at higher dose and try wean Seroquel.  Then wean gabapenin.   Was more focused and better mood stability on lithium. Mirtazapine does not help sleep much. Paranoid about being hurt if she dates and no history of it.  Develop a whole story about what might happen. Plan: Stop mirtazapine DT NR Retry Doxepin at higher dose of 50 mg up to 100 mg  HS Once sleeping well then try to taper Seroquel very slowly such as reduce by 50 mg every 2 to 3 weeks or even slower if necessary. Restart lithium 600 mg nightly. Check another level before next appt Don't stop meds AMA.   Call if there's a problem.  11/16/21 appt noted: Second week of Seroquel 150 mg daily. Is sleeping currently but not the same quality but not terrible.  On doxepin 75 mg HS Mood is good.  Likes the lithium better than Seroquel for mood.  Fewer hypomanic episones and more stable with less anger. Never got lithium level.  Says she'll do so. Rare lorazepam. No SE right now. Asks about Ozempic. Plan: continue Doxepin at higher dose of 50 mg up to 100 mg HS Once sleeping well then try to taper Seroquel very slowly such as reduce by 50 mg every 2 to 3 weeks or even slower if necessary. Continue lithium 600 mg nightly. Get lithium level ASAP.  Then repeat lithium level with every 20 # of weight loss Don't stop  meds AMA.   Call if there's a problem.  01/18/2022 appointment with the following noted: Ran out of doxepin and says refill rejected for 3 weeks. Was feeling kind of down. Down to Seroquel 25 mg HS for 2 + weeks. Still sleeping with some awakening.  Surprising good sleep.  6 and 1/2 hours of sleep and not drowsy daytime. Mood is fine.   Not sure if doxepin helped sleep but would like to have it. Patient reports stable mood and denies depressed or irritable moods.  Patient denies any recent difficulty with anxiety.  Patient denies difficulty with sleep initiation or maintenance. Denies appetite disturbance.  Patient reports that energy and motivation have been good.  Patient denies any difficulty with concentration.  Patient denies any suicidal ideation. Plan no med chages  04/17/22 appt noted: Takes gabapentin prn sleep and it helps. Felt dull on lithium and U frequency so reduced it to 1of 300 mg  lithium daily. Down to Seroquel 25 mg HS and Sonata on  occasion. Feels less dull with less lithium.  Thinks she was more depressed on lihtium at 600 mg daily.  Feels better the last 5 days.  Less urinary frequency. Sleep is less deep with less Seroquel and shorter duration 6 hours.  It's not terrrible but not great. 100 times better cognitively with less Seroquel.   Noticed it at work. Anxiety in check and she's surprised. Doesn't want med changes today. Plan; Cogniton better with less serotquel 25 mg HS. She wants to try lorazepam 0.5 mg HS in place of Sonata to see if she can sleep longer. Continue lithium 600 mg nightly was recommended but she dropped it to 300 mg nightly because she complained of dullness at the 600 mg and perhaps even some depression.  We agreed on a compromise of 450 mg nightly.  Even that will be below the usual therapeutic range and there is significant risk of mood instability and relapse with this and she accepts that risk.   07/04/22 appt noted: Using lorazepam about every 3 mos. Taking Sonata more about 1/2 the time. Taking quetiapine 25 mg nightly and lithium 450 mg daily. Don't think meds working out for her.  Not taking lithium enough.  More hypomania and sleep not enough nor restorative. Hypomania includes talking to herself a lot and tell funny stories to herself. Excitable mood and speech. More energy and need to burn it off.  Therapist noticed.   EMA.  About 5 hours sleep since off more Seroquel. On more lithium had less hypomania but felt flat on more lithium.  Still frequent urination.   Saw a urologist. Ativan not helpful sleep Plan: Cogniton complaints resolved with less serotquel 25 mg HS. But not sleeping.   Option switch to Thorazine 25 mg for less hangover.  She will call in a couple of weeks and if sleep is not better we will switch to the Thorazine.  She understands this is not an antipsychotic at these low dosages but it can have other side effects like dizziness or drowsiness. Increase lithium to 450/600  mg QOD bc hypomania at 450 daily and dullness at 600 mg daily. Check lithium level in 2 weks.   09/07/22 NS  10/19/2022 appointment noted: Chlorpromazine made her feel wired and only taken once or twice and stopped. Still trouble with sleep unless takes more Seroquel.  Seroquel helps her go to sleep but not stay asleep. Thinks lithium is dulling and more periods of hypomania.  Wants to stay  on lithium bc doesn't want to take antipsychotic for mood stabilizer.  Don't feel as connected to people on it bc feels flat.  Hard to feel emotional about things.  On Seroquel felt like personality was more normal.  Cognition definitely better off the Seroquel. Sonata about once every 3 weeks.    Avg 5 hours of sleep nightly without napping.  Not drowsy but is tired during the day.  Can't sleep withoout meds. Has been having a lot of hypomania but no one else is complaining.  More than I usually have and can notice it at work.  More chatty and more inclined to be outspoken and borderline inappopriate.  No anger problems.   Not depressed much ever.   Gall bladder removed last year and GI Sx ongoing with ongoing workup.  GI problems predate lithium she thinks.  When manic feels more reckless, spending, talk to herself, driven, reduced sleep, irritable and angry.  Past Psychiatric Medication Trials: Vraylar 3 SE, Abilify, brief Latuda ? effect,  Depakote NR, carbamazepine questionable side effects, lamotrigine lost response, Trileptal,  lithium , clozapine, olanzapine , Seroquel 600, risperidone 1 mg twice daily  , Saphris CO anxiety  Gabapentin cog SE,  Trazodone 200, doxepin 30 not sedating, mirtazapine,  Thorazine 25 mg NR and activated Lorazepam 1 mg poor response for sleep  several SSRIs, clomipramine had vision side effects,   failed an attempt to switch from Seroquel to [place] early 2020  Failed switch from Seroquel to [place], Saphris, Vraylar and loxapine No psych hosp.  Review of Systems:   Review of Systems  Cardiovascular:  Negative for chest pain and palpitations.  Gastrointestinal:  Negative for diarrhea.  Genitourinary:  Positive for frequency.  Neurological:  Negative for dizziness and tremors.  Psychiatric/Behavioral:  Positive for decreased concentration and sleep disturbance. Negative for agitation, behavioral problems, confusion, dysphoric mood, hallucinations, self-injury and suicidal ideas. The patient is not nervous/anxious and is not hyperactive.     Medications: I have reviewed the patient's current medications.  Current Outpatient Medications  Medication Sig Dispense Refill   Acetylcysteine (NAC) 600 MG CAPS Take 1 capsule (600 mg total) by mouth daily. 90 capsule 1   atenolol (TENORMIN) 50 MG tablet Take 50 mg by mouth at bedtime.     B Complex Vitamins (VITAMIN B-COMPLEX) TABS Take 1 tablet by mouth at bedtime.     Calcium Carbonate-Vitamin D (CALCIUM-D PO) Take by mouth at bedtime.     cefadroxil (DURICEF) 1 g tablet Take 1 tablet (1 g total) by mouth 2 (two) times daily. 14 tablet 0   Cholecalciferol (VITAMIN D3) 5000 units CAPS Take 1 capsule by mouth at bedtime.     folic acid (FOLVITE) A999333 MCG tablet Take 400 mcg by mouth at bedtime.     levothyroxine (SYNTHROID, LEVOTHROID) 112 MCG tablet Take 112 mcg by mouth at bedtime.     linaclotide (LINZESS) 290 MCG CAPS capsule Take 290 mcg by mouth at bedtime.     lithium carbonate 150 MG capsule Take 1 capsule (150 mg total) by mouth daily. 90 capsule 0   lithium carbonate 150 MG capsule Take 1 capsule (150 mg total) by mouth daily. (Patient taking differently: Take 150 mg by mouth daily. Currently taking 450 mg daily) 90 capsule 1   lithium carbonate 300 MG capsule Take 2 capsules (600 mg total) by mouth daily. (Patient taking differently: Take 600 mg by mouth daily. 2 tabs every other day alternating with 150 mg) 180 capsule 0  LORazepam (ATIVAN) 0.5 MG tablet Take 1-2 tablets (0.5-1 mg total) by mouth every  8 (eight) hours as needed for anxiety. 50 tablet 0   MAGNESIUM PO Take 400 mg by mouth at bedtime.     memantine (NAMENDA) 10 MG tablet TAKE 1 TABLET TWICE A DAY 180 tablet 0   metroNIDAZOLE (FLAGYL) 500 MG tablet Take 1 tablet (500 mg total) by mouth 2 (two) times daily. 14 tablet 0   tretinoin (RETIN-A) 0.025 % cream Apply topically at bedtime. face     zaleplon (SONATA) 10 MG capsule Take 1 capsule (10 mg total) by mouth at bedtime as needed for sleep. 90 capsule 0   chlorproMAZINE (THORAZINE) 25 MG tablet Take 1 tablet (25 mg total) by mouth at bedtime. (Patient not taking: Reported on 10/09/2022) 30 tablet 0   No current facility-administered medications for this visit.    Medication Side Effects: None  Allergies: No Known Allergies  Past Medical History:  Diagnosis Date   Anxiety    Bipolar 1 disorder ([place])    followed by dr c. cottle   Carpal tunnel syndrome on both sides    CKD (chronic kidney disease), stage II    followed by pcp   Endometriosis    Hypothyroidism, postablative    endocrinologist--- dr c. Barnet Pall--- dx toxic multinodular thyroid 2000 w/ hyperthyroidism s/p RAI  2000, 2002, and 2004;  post hypothryoid in 2008;   last bx 2012 left side, benign per pt   Irritable bowel syndrome with constipation    followed by dr w. Glennon Hamilton (Fabienne Bruns)   MDD (major depressive disorder)    Multinodular thyroid    in 2000 dx toxic post ablative   NAFLD (nonalcoholic fatty liver disease)    OSA (obstructive sleep apnea)    05-18-2021  per pt study done approx. 2018, told moderat OSA,  does uses cpap   Tachycardia    cardiologist--- Sigurd Sos NP @ [facility] in [place],  w/ palpitations, controlled w/ atenolol;  pt has had work-up per note echo 2014 normal ,  event monitor 2018 showedST w/ mild ST no arrhythmia,  ETT 02/ 2022 normal no ischemia, ef 67%   Wears contact lenses     Family History  Problem Relation Age of Onset   Thyroid disease Mother    Diabetes Mother    Hypertension Mother      Social History   Socioeconomic History   Marital status: Divorced    Spouse name: Not on file   Number of children: Not on file   Years of education: Not on file   Highest education level: Not on file  Occupational History   Not on file  Tobacco Use   Smoking status: Former    Years: 5.00    Types: Cigarettes    Quit date: 2000    Years since quitting: 23.8   Smokeless tobacco: Never  Vaping Use   Vaping Use: Never used  Substance and Sexual Activity   Alcohol use: Yes    Alcohol/week: 1.0 standard drink of alcohol    Types: 1 Standard drinks or equivalent per week   Drug use: Never   Sexual activity: Not on file  Other Topics Concern   Not on file  Social History Narrative   Not on file   Social Determinants of Health   Financial Resource Strain: Not on file  Food Insecurity: Not on file  Transportation Needs: Not on file  Physical Activity: Not on file  Stress: Not on  file  Social Connections: Not on file  Intimate Partner Violence: Not on file    Past Medical History, Surgical history, Social history, and Family history were reviewed and updated as appropriate.   Please see review of systems for further details on the patient's review from today.   Objective:   Physical Exam:  There were no vitals taken for this visit.  Physical Exam Neurological:     Mental Status: She is alert and oriented to person, place, and time.     Cranial Nerves: No dysarthria.  Psychiatric:        Attention and Perception: She is inattentive.        Mood and Affect: Mood is not anxious or depressed.        Speech: Speech normal. Speech is not rapid and pressured or slurred.        Behavior: Behavior is cooperative.        Thought Content: Thought content is not paranoid or delusional. Thought content does not include homicidal or suicidal ideation. Thought content does not include suicidal plan.        Cognition and Memory: She does not exhibit impaired recent memory or  impaired remote memory.        Judgment: Judgment normal.     Comments: Insight and judgment appear good. Hypomania ongoing No significant pressured speech or flight of ideas     Lab Review:     Component Value Date/Time   NA 136 04/10/2021 1035   K 4.3 04/10/2021 1035   CL 97 04/10/2021 1035   CO2 25 04/10/2021 1035   GLUCOSE 77 04/10/2021 1035   BUN 13 04/10/2021 1035   CREATININE 0.94 04/10/2021 1035   CALCIUM 9.7 04/10/2021 1035       Component Value Date/Time   WBC 6.6 05/24/2021 0630   RBC 3.79 (L) 05/24/2021 0630   HGB 11.3 (L) 05/24/2021 0630   HCT 35.0 (L) 05/24/2021 0630   PLT 325 05/24/2021 0630   MCV 92.3 05/24/2021 0630   MCH 29.8 05/24/2021 0630   MCHC 32.3 05/24/2021 0630   RDW 13.2 05/24/2021 0630    Lithium Lvl  Date Value Ref Range Status  12/05/2021 0.6 0.5 - 1.2 mmol/L Final    Comment:    A concentration of 0.5-0.8 mmol/L is advised for long-term use; concentrations of up to 1.2 mmol/L may be necessary during acute treatment.                                  Detection Limit = 0.1                           <0.1 indicates None Detected   12/05/2021 lithium level 0.6 on 600 mg daily stable  04/10/2021 lithium level 0.8 on 600 mg daily.  No results found for: "PHENYTOIN", "PHENOBARB", "VALPROATE", "CBMZ"   Took lab orders to PCP from visit in September and reports results: B12 >2000 Folate > 20 D 43.5 Iron TIBC 297 (250-450)  UIBC 156 (131-425)  Iron 141 (27-159)  Iron saturation 47 (15-55)   .res Assessment: Plan:    Bipolar disorder, in partial remission, most recent episode mixed ([place]) - Plan: Lithium level  Generalized anxiety disorder  Insomnia due to mental condition  Mixed obsessional thoughts and acts  MCI (mild cognitive impairment)  Lithium use - Plan: Lithium level  Low vitamin D  level   Greater than 50% of 30-minute video time with patient was spent on counseling and coordination of care. We discussed nature of her  psychiatric diagnoses .   She remains motivated to get off Seroquel because she believes is causing cognitive problems and fears brain matter loss.  Mood stable so far with less Seroquel.. Benefit from lithium but complaining of dullness at 600 mg daily.  She reduced AMA to 300 mg daily and disc this is very low dose and puts her at risk  Bipolar disorder Was worse with reduction in Seroquel until addtion of lithium has helped so far.  She feels lithium has provided better mood stability than does Seroquel. Failed switch from Seroquel to [place], [place], Vraylar and loxapine   Counseled patient regarding potential benefits, risks, and side effects of lithium to include potential risk of lithium affecting thyroid and renal function.  Discussed need for periodic lab monitoring to determine drug level and to assess for potential adverse effects.  Counseled patient regarding signs and symptoms of lithium toxicity and advised that they notify office immediately or seek urgent medical attention if experiencing these signs and symptoms.  Patient advised to contact office with any questions or concerns.  Hypomania disc extensively.  Currently hypomanic because of a low dose of lithium.  The flatness is still a problem.  She doesn't want to increase the lithium despite some ongoing hypomania.  We discussed alternatives including switching to an antipsychotic type mood stabilizer.  She has been on all the traditional mood stabilizers that are not antipsychotics.   She does not want to try another antipsychotic mood stabilizer at this time though there are options like Caplyta.  Switch sleeper to Belsomra or Dayvigo with samples given She wants to continue to use low dose Seroquel 25 mg HS until sometihing else works.  Increase lithium to 450/600 mg QOD bc hypomania at 450 daily and dullness at 600 mg daily. Check lithium level ASAP Normal CR and CA in 8/23 at [facility] 12/05/2021 lithium level 0.6 on 600 mg daily  stable  Don't stop meds AMA.   Call if there's a problem.  Sleep hygiene incl no bed without sleep.  Sleep options Belsomra, Dayvigo, doxepin, Rozerem, hydroxyzine.  Prefer avoid BZ and bc cognitive concerns she has. Wants Sonata rarely.  Option to stop memantine 10 BID off label for cognitive problems.  Because the cognitive problems resolved with reduction in Seroquel and lithium and the onset of hypomania.  Discussed potential metabolic side effects associated with atypical antipsychotics, as well as potential risk for movement side effects. Advised pt to contact office if movement side effects occur.   Thyroid was normal recently. B12 and folate are normal D is OK but goal with her should be 50s-60s Disc reasons in detail including Dr. Missy Sabins recommendation. Marland Kitchen  gave neuroprotective literature on lithium. Dr. Olene Floss article and gave article before.    FU2-3 mos  Lynder Parents, MD, DFAPA   Please see After Visit Summary for patient specific instructions.  Future Appointments  Date Time Provider [place]  11/09/2022 12:00 PM Mendelson, Dorinda Hill, PhD [facility] None      Orders Placed This Encounter  Procedures   Lithium level       -------------------------------

## 2022-10-11 LAB — LITHIUM LEVEL: Lithium Lvl: 0.6 mmol/L (ref 0.5–1.2)

## 2022-10-12 ENCOUNTER — Other Ambulatory Visit: Payer: Self-pay | Admitting: Psychiatry

## 2022-10-12 DIAGNOSIS — Z79899 Other long term (current) drug therapy: Secondary | ICD-10-CM

## 2022-10-12 DIAGNOSIS — F5105 Insomnia due to other mental disorder: Secondary | ICD-10-CM

## 2022-10-12 DIAGNOSIS — F3162 Bipolar disorder, current episode mixed, moderate: Secondary | ICD-10-CM

## 2022-10-12 DIAGNOSIS — G3184 Mild cognitive impairment, so stated: Secondary | ICD-10-CM

## 2022-10-15 NOTE — Telephone Encounter (Signed)
Patient said she is still taking Seroquel 25 mg

## 2022-10-15 NOTE — Telephone Encounter (Signed)
LVM to RC. Last note said she wants to stay on 25 mg Seroquel until something else works. ? If she is still taking it.

## 2022-11-09 ENCOUNTER — Ambulatory Visit (INDEPENDENT_AMBULATORY_CARE_PROVIDER_SITE_OTHER): Payer: Federal, State, Local not specified - PPO | Admitting: Clinical

## 2022-11-09 DIAGNOSIS — F3177 Bipolar disorder, in partial remission, most recent episode mixed: Secondary | ICD-10-CM | POA: Diagnosis not present

## 2022-11-09 NOTE — Progress Notes (Signed)
Time: 12:00 pm-1:00pm CPT code: [id number] Diagnosis Code: Bipolar Disorder, in partial Remission  Ashwini was seen remotely using secure video conferencing. She was in her home in [place] and the therapist was in her office at the time of her appointment. Session focused on her friendships, and tendency to maintain friendships that don't seem genuine while she tends to ghost more genuine friends. Therapist discussed this with her, and she agreed that social anxiety may underlie her tendency not to respond to people with whom she would like to maintain friendships. Therapist discussed with her what she might say to reach out to a former friend, and she created a plan to reach out for homework. She is scheduled to be seen again in January.  Treatment Plan Client Abilities/Strengths  Jaclin is honest and able to articulate her feelings and experiences. She has a history of consistently attending therapy appointment. She described herself as willing to engage in therapy homework. She has found that she benefits from being reminded of her goals, as she sometimes has a tendency to lose focus on her goals over time.   Client Treatment Preferences:  Lyndee prefers Monday and Tuesday appointments-this is best for her work schedule. Time availability varies day to day. She does not have a preference between virtual and in person. Client Statement of Needs Lourdes shared that she is seeking encouragement in therapy, as well as having thoughts challenged and being presented with alternate perspectives to consider. She shared that she has also benefited from advice to manage various situations in her life. She is also seeking techniques to build social connections with others.  Treatment Level   She would like biweekly appointments. Symptoms Micaela tends to prioritize others and delay self-care. She reported struggling in several social situations. She also reported that she sometimes focuses on her  ex-husband's life more than she would like.   Problems Addressed  Goals  Objective 1. Shruthi would like to learn to take better care of herself and prioritize her emotional needs.    Target Date: 09/26/2023 Frequency: Biweekly  Progress: 0 Modality: Individual therapy  2. Sentoria shared that she would like to be a more present and capable parent, and improve her parents skills  Target Date: 09/26/2023 Progress: 0 Frequency: Biweekly Modality: Individual Therapy 3. Rebbecca would like to expand her social network and create social connections and friendships  Target Date: 09/26/2023 Progress: 0 Frequency: Biweekly Modality: Individual Therapy  Related Interventions Therapist will provide opportunities to process experiences in session. Therapist will help Kimberley to notice and disengage from maladaptive thoughts and behaviors using CBT-based strategies. Therapist will incorporate parent training strategies as appropriate. Therapist will engage Olive in a discussion of various social situations, including consideration of how to respond in a way that takes her toward her goals, and incorporating additional resources (including in-vivo role plays) as appropriate Therapist will help Shaquana to identify opportunities for increased social connection in her community. Therapist will provide referrals for additional resources as appropriate                 Chrissie [name], PhD               Chrissie [name], PhD

## 2022-12-13 ENCOUNTER — Ambulatory Visit (INDEPENDENT_AMBULATORY_CARE_PROVIDER_SITE_OTHER): Payer: Federal, State, Local not specified - PPO | Admitting: Clinical

## 2022-12-13 DIAGNOSIS — F3177 Bipolar disorder, in partial remission, most recent episode mixed: Secondary | ICD-10-CM | POA: Diagnosis not present

## 2022-12-13 NOTE — Progress Notes (Signed)
Time: 12:00 pm-1:00pm CPT code: [id number] Diagnosis Code: Bipolar Disorder, in partial Remission  Kristen Leon was seen remotely using secure video conferencing. She was in her home in [place] and the therapist was in her office at the time of her appointment. She had not completed her homework of calling her friend, but sent her friend a text during session to schedule a phone call. Session focused on social dynamics in her workplace. Therapist provided an opportunity to process, and suggested communication strategies for several situations Kristen Leon described. She is scheduled to be seen again in two weeks.  Treatment Plan Client Abilities/Strengths  Kristen Leon is honest and able to articulate her feelings and experiences. She has a history of consistently attending therapy appointment. She described herself as willing to engage in therapy homework. She has found that she benefits from being reminded of her goals, as she sometimes has a tendency to lose focus on her goals over time.   Client Treatment Preferences:  Kristen Leon prefers Monday and Tuesday appointments-this is best for her work schedule. Time availability varies day to day. She does not have a preference between virtual and in person. Client Statement of Needs Kristen Leon shared that she is seeking encouragement in therapy, as well as having thoughts challenged and being presented with alternate perspectives to consider. She shared that she has also benefited from advice to manage various situations in her life. She is also seeking techniques to build social connections with others.  Treatment Level   She would like biweekly appointments. Symptoms Kristen Leon tends to prioritize others and delay self-care. She reported struggling in several social situations. She also reported that she sometimes focuses on her ex-husband's life more than she would like.   Problems Addressed  Goals  Objective 1. Kristen Leon would like to learn to take better care of  herself and prioritize her emotional needs.    Target Date: 09/26/2023 Frequency: Biweekly  Progress: 0 Modality: Individual therapy  2. Kristen Leon shared that she would like to be a more present and capable parent, and improve her parents skills  Target Date: 09/26/2023 Progress: 0 Frequency: Biweekly Modality: Individual Therapy 3. Kristen Leon would like to expand her social network and create social connections and friendships  Target Date: 09/26/2023 Progress: 0 Frequency: Biweekly Modality: Individual Therapy  Related Interventions Therapist will provide opportunities to process experiences in session. Therapist will help Kristen Leon to notice and disengage from maladaptive thoughts and behaviors using CBT-based strategies. Therapist will incorporate parent training strategies as appropriate. Therapist will engage Kristen Leon in a discussion of various social situations, including consideration of how to respond in a way that takes her toward her goals, and incorporating additional resources (including in-vivo role plays) as appropriate Therapist will help Kristen Leon to identify opportunities for increased social connection in her community. Therapist will provide referrals for additional resources as appropriate    Kristen Leon Cruise, PhD               Kristen Leon Cruise, PhD

## 2022-12-23 ENCOUNTER — Other Ambulatory Visit: Payer: Self-pay | Admitting: Psychiatry

## 2022-12-23 DIAGNOSIS — Z79899 Other long term (current) drug therapy: Secondary | ICD-10-CM

## 2022-12-23 DIAGNOSIS — F3162 Bipolar disorder, current episode mixed, moderate: Secondary | ICD-10-CM

## 2022-12-23 DIAGNOSIS — G3184 Mild cognitive impairment, so stated: Secondary | ICD-10-CM

## 2022-12-23 DIAGNOSIS — F5105 Insomnia due to other mental disorder: Secondary | ICD-10-CM

## 2022-12-26 ENCOUNTER — Ambulatory Visit: Payer: Federal, State, Local not specified - PPO | Admitting: Clinical

## 2022-12-26 DIAGNOSIS — F3177 Bipolar disorder, in partial remission, most recent episode mixed: Secondary | ICD-10-CM | POA: Diagnosis not present

## 2022-12-26 NOTE — Progress Notes (Signed)
Time: 12:00 pm-1:00pm CPT code: [id number] Diagnosis Code: Bipolar Disorder, in partial Remission  Kristen Leon was seen remotely using secure video conferencing. She was in her home in [place] and the therapist was in her office at the time of her appointment. She had completed her homework of catching up with her friend, and reported that it had gone well. Session focused on her relationships, and her desire for connection coupled with her tendency to avoid vulnerability. Therapist engaged her in a discussion of vulnerability in relationships, and worked with her to consider relatively emotionally safe ways that she can start to open up more to others. She is scheduled to be seen again in one month.  Treatment Plan Client Abilities/Strengths  Kristen Leon is honest and able to articulate her feelings and experiences. She has a history of consistently attending therapy appointment. She described herself as willing to engage in therapy homework. She has found that she benefits from being reminded of her goals, as she sometimes has a tendency to lose focus on her goals over time.   Client Treatment Preferences:  Kristen Leon prefers Monday and Tuesday appointments-this is best for her work schedule. Time availability varies day to day. She does not have a preference between virtual and in person. Client Statement of Needs Kristen Leon shared that she is seeking encouragement in therapy, as well as having thoughts challenged and being presented with alternate perspectives to consider. She shared that she has also benefited from advice to manage various situations in her life. She is also seeking techniques to build social connections with others.  Treatment Level   She would like biweekly appointments. Symptoms Kristen Leon tends to prioritize others and delay self-care. She reported struggling in several social situations. She also reported that she sometimes focuses on her ex-husband's life more than she would like.    Problems Addressed  Goals  Objective 1. Kristen Leon would like to learn to take better care of herself and prioritize her emotional needs.    Target Date: 09/26/2023 Frequency: Biweekly  Progress: 0 Modality: Individual therapy  2. Kristen Leon shared that she would like to be a more present and capable parent, and improve her parents skills  Target Date: 09/26/2023 Progress: 0 Frequency: Biweekly Modality: Individual Therapy 3. Kristen Leon would like to expand her social network and create social connections and friendships  Target Date: 09/26/2023 Progress: 0 Frequency: Biweekly Modality: Individual Therapy  Related Interventions Therapist will provide opportunities to process experiences in session. Therapist will help Kristen Leon to notice and disengage from maladaptive thoughts and behaviors using CBT-based strategies. Therapist will incorporate parent training strategies as appropriate. Therapist will engage Kristen Leon in a discussion of various social situations, including consideration of how to respond in a way that takes her toward her goals, and incorporating additional resources (including in-vivo role plays) as appropriate Therapist will help Kristen Leon to identify opportunities for increased social connection in her community. Therapist will provide referrals for additional resources as appropriate                   Kristen Cruise, PhD               Kristen Cruise, PhD

## 2022-12-27 ENCOUNTER — Other Ambulatory Visit: Payer: Self-pay

## 2022-12-27 DIAGNOSIS — G3184 Mild cognitive impairment, so stated: Secondary | ICD-10-CM

## 2022-12-27 DIAGNOSIS — F3162 Bipolar disorder, current episode mixed, moderate: Secondary | ICD-10-CM

## 2022-12-27 DIAGNOSIS — F5105 Insomnia due to other mental disorder: Secondary | ICD-10-CM

## 2022-12-27 MED ORDER — QUETIAPINE FUMARATE 25 MG PO TABS: 25.0000 mg | ORAL_TABLET | Freq: Every day | ORAL | 0 refills | Status: DC

## 2022-12-27 MED ORDER — MEMANTINE HCL 10 MG PO TABS: 10.0000 mg | ORAL_TABLET | Freq: Two times a day (BID) | ORAL | 0 refills | Status: DC

## 2023-01-14 ENCOUNTER — Telehealth: Payer: Federal, State, Local not specified - PPO | Admitting: Psychiatry

## 2023-01-25 ENCOUNTER — Ambulatory Visit: Payer: Federal, State, Local not specified - PPO | Admitting: Clinical

## 2023-01-25 DIAGNOSIS — F3177 Bipolar disorder, in partial remission, most recent episode mixed: Secondary | ICD-10-CM | POA: Diagnosis not present

## 2023-01-25 NOTE — Progress Notes (Signed)
Time: 3:00 pm-4:00pm CPT code: [id number] Diagnosis Code: Bipolar Disorder, in partial Remission  Kristen Leon was seen remotely using secure video conferencing. She was in her home in [place] and the therapist was in her office at the time of her appointment. She shared that she had reached out to her friend and it had gone well. She plans to reach out to her friend again. She has also started dating someone. Session focused on processing the tragic and sudden death of her 56 year old niece, with whom she was close. Therapist offered validation and support, as well as psychoeducation on grief. She is scheduled to be seen again in two weeks.  Treatment Plan Client Abilities/Strengths  Kristen Leon is honest and able to articulate her feelings and experiences. She has a history of consistently attending therapy appointment. She described herself as willing to engage in therapy homework. She has found that she benefits from being reminded of her goals, as she sometimes has a tendency to lose focus on her goals over time.   Client Treatment Preferences:  Kristen Leon prefers Monday and Tuesday appointments-this is best for her work schedule. Time availability varies day to day. She does not have a preference between virtual and in person. Client Statement of Needs Kristen Leon shared that she is seeking encouragement in therapy, as well as having thoughts challenged and being presented with alternate perspectives to consider. She shared that she has also benefited from advice to manage various situations in her life. She is also seeking techniques to build social connections with others.  Treatment Level   She would like biweekly appointments. Symptoms Kristen Leon tends to prioritize others and delay self-care. She reported struggling in several social situations. She also reported that she sometimes focuses on her ex-husband's life more than she would like.   Problems Addressed  Goals  Objective 1. Kristen Leon would like to  learn to take better care of herself and prioritize her emotional needs.    Target Date: 09/26/2023 Frequency: Biweekly  Progress: 0 Modality: Individual therapy  2. Kristen Leon shared that she would like to be a more present and capable parent, and improve her parents skills  Target Date: 09/26/2023 Progress: 0 Frequency: Biweekly Modality: Individual Therapy 3. Kristen Leon would like to expand her social network and create social connections and friendships  Target Date: 09/26/2023 Progress: 0 Frequency: Biweekly Modality: Individual Therapy  Related Interventions Therapist will provide opportunities to process experiences in session. Therapist will help Kristen Leon to notice and disengage from maladaptive thoughts and behaviors using CBT-based strategies. Therapist will incorporate parent training strategies as appropriate. Therapist will engage Kristen Leon in a discussion of various social situations, including consideration of how to respond in a way that takes her toward her goals, and incorporating additional resources (including in-vivo role plays) as appropriate Therapist will help Kristen Leon to identify opportunities for increased social connection in her community. Therapist will provide referrals for additional resources as appropriate     Kristen Leon Cruise, PhD               Kristen Leon Cruise, PhD

## 2023-02-01 ENCOUNTER — Ambulatory Visit: Payer: Federal, State, Local not specified - PPO | Admitting: Clinical

## 2023-02-12 ENCOUNTER — Ambulatory Visit: Payer: Federal, State, Local not specified - PPO | Admitting: Internal Medicine

## 2023-02-12 ENCOUNTER — Encounter: Payer: Self-pay | Admitting: Internal Medicine

## 2023-02-12 ENCOUNTER — Other Ambulatory Visit: Payer: Self-pay

## 2023-02-12 VITALS — BP 105/69 | HR 68 | Temp 98.1°F | Ht 66.0 in | Wt 174.0 lb

## 2023-02-12 DIAGNOSIS — N951 Menopausal and female climacteric states: Secondary | ICD-10-CM

## 2023-02-12 NOTE — Progress Notes (Signed)
RFV: lichens planus  Patient ID: Kristen Leon, female   DOB: [birth year], 57 y.o.   MRN: [id number]  HPI  Last seen in July 2023. Since then tx in aug for ecoli, with fluc and cephalexin. Then in November -  ecoli and b hemolytic strep tx with amp then most recently treated with amox clav for beta hemolytic strep, and also isolated raoutella planticola +esbl on repeat cultures. Upon reviewing records, it appears that  Dr Senaida Ores doing vaginal aerobic cultures.   Same: feeling of discharge(but not noticing it)  and vaginal fullness, all day and every day  Now using an estrogen ring for 4 months, easier than intravaginal premarin.   Recently treated with 1 week of amox/clav with no difference with symptoms. And no difference with the prior abtx cycles.     Outpatient Encounter Medications as of 02/12/2023  Medication Sig   Acetylcysteine (NAC) 600 MG CAPS Take 1 capsule (600 mg total) by mouth daily.   atenolol (TENORMIN) 50 MG tablet Take 50 mg by mouth at bedtime.   B Complex Vitamins (VITAMIN B-COMPLEX) TABS Take 1 tablet by mouth at bedtime.   Calcium Carbonate-Vitamin D (CALCIUM-D PO) Take by mouth at bedtime.   Cholecalciferol (VITAMIN D3) 5000 units CAPS Take 1 capsule by mouth at bedtime.   ESTRING 7.5 MCG/24HR vaginal ring    fluticasone (FLONASE) 50 MCG/ACT nasal spray Place into the nose.   levothyroxine (SYNTHROID, LEVOTHROID) 112 MCG tablet Take 112 mcg by mouth at bedtime.   linaclotide (LINZESS) 290 MCG CAPS capsule Take 290 mcg by mouth at bedtime.   lithium carbonate 150 MG capsule Take 1 capsule (150 mg total) by mouth daily.   lithium carbonate 150 MG capsule Take 1 capsule (150 mg total) by mouth daily. (Patient taking differently: Take 150 mg by mouth daily. Currently taking 450 mg daily)   lithium carbonate 300 MG capsule TAKE 2 CAPSULES DAILY   LORazepam (ATIVAN) 0.5 MG tablet Take 1-2 tablets (0.5-1 mg total) by mouth every 8 (eight) hours as needed for  anxiety.   MAGNESIUM PO Take 400 mg by mouth at bedtime.   memantine (NAMENDA) 10 MG tablet Take 1 tablet (10 mg total) by mouth 2 (two) times daily.   QUEtiapine (SEROQUEL) 25 MG tablet Take 1 tablet (25 mg total) by mouth at bedtime.   temazepam (RESTORIL) 15 MG capsule    zaleplon (SONATA) 10 MG capsule TAKE 1 CAPSULE AT BEDTIME  AS NEEDED FOR SLEEP   cefadroxil (DURICEF) 1 g tablet Take 1 tablet (1 g total) by mouth 2 (two) times daily. (Patient not taking: Reported on 02/12/2023)   chlorproMAZINE (THORAZINE) 25 MG tablet Take 1 tablet (25 mg total) by mouth at bedtime. (Patient not taking: Reported on 10/09/2022)   folic acid (FOLVITE) 400 MCG tablet Take 400 mcg by mouth at bedtime. (Patient not taking: Reported on 02/12/2023)   metroNIDAZOLE (FLAGYL) 500 MG tablet Take 1 tablet (500 mg total) by mouth 2 (two) times daily. (Patient not taking: Reported on 02/12/2023)   tretinoin (RETIN-A) 0.025 % cream Apply topically at bedtime. face (Patient not taking: Reported on 02/12/2023)   No facility-administered encounter medications on file as of 02/12/2023.     Patient Active Problem List   Diagnosis Date Noted   Bipolar 1 disorder, mixed, moderate (HCC) 08/29/2018   OCD (obsessive compulsive disorder) 08/29/2018   MCI (mild cognitive impairment) 08/29/2018     Health Maintenance Due  Topic Date Due   Hepatitis C Screening  Never done   DTaP/Tdap/Td (1 - Tdap) Never done   [name]  Never done   COLONOSCOPY (Pts 45-69yrs Insurance coverage will need to be confirmed)  Never done   MAMMOGRAM  Never done   COVID-19 Vaccine (7 - 2023-24 season) 11/08/2022     Review of Systems 12 point ros is negative except what is mentioned above Physical Exam   BP 105/69   Pulse 68   Temp 98.1 F (36.7 C) (Oral)   Ht 5\' 6"  (1.676 m)   Wt 174 lb (78.9 kg)   SpO2 99%   BMI 28.08 kg/m    Gen = a x o by 3 in nad  CBC Lab Results  Component Value Date   WBC 6.6 05/24/2021   RBC  3.79 (L) 05/24/2021   HGB 11.3 (L) 05/24/2021   HCT 35.0 (L) 05/24/2021   PLT 325 05/24/2021   MCV 92.3 05/24/2021   MCH 29.8 05/24/2021   MCHC 32.3 05/24/2021   RDW 13.2 05/24/2021    BMET Lab Results  Component Value Date   NA 136 04/10/2021   K 4.3 04/10/2021   CL 97 04/10/2021   CO2 25 04/10/2021   GLUCOSE 77 04/10/2021   BUN 13 04/10/2021   CREATININE 0.94 04/10/2021   CALCIUM 9.7 04/10/2021      Assessment and Plan  - would avoid doing further vaginal swabs since it is likely capturing colonizing bacteria, not likely invasive infection, unless she has urinary tract symptoms  -vaginal dryness - would continue with ring  Dr Senaida Ores, [facility] - will discuss about trying to get biopsy and seeing if lichen planus is more common and if path is more consistent with lichen planus then find ways to treat.

## 2023-02-15 ENCOUNTER — Ambulatory Visit (INDEPENDENT_AMBULATORY_CARE_PROVIDER_SITE_OTHER): Payer: Federal, State, Local not specified - PPO | Admitting: Clinical

## 2023-02-15 DIAGNOSIS — F3177 Bipolar disorder, in partial remission, most recent episode mixed: Secondary | ICD-10-CM

## 2023-02-15 NOTE — Progress Notes (Signed)
Time: 3:00 pm-4:00pm CPT code: [id number] Diagnosis Code: Bipolar Disorder, in partial Remission  Kristen Leon was seen remotely using secure video conferencing. She was in a parked car outside of [place] in [place] and the therapist was in her office at the time of her appointment. Session focused on her continued feelings of loneliness and isolation, especially at work. Therapist offered an opportunity to process and provided suggestions in terms of communication strategies. She is scheduled to be seen again in two weeks.  Treatment Plan Client Abilities/Strengths  Kristen Leon is honest and able to articulate her feelings and experiences. She has a history of consistently attending therapy appointment. She described herself as willing to engage in therapy homework. She has found that she benefits from being reminded of her goals, as she sometimes has a tendency to lose focus on her goals over time.   Client Treatment Preferences:  Kristen Leon prefers Monday and Tuesday appointments-this is best for her work schedule. Time availability varies day to day. She does not have a preference between virtual and in person. Client Statement of Needs Kristen Leon shared that she is seeking encouragement in therapy, as well as having thoughts challenged and being presented with alternate perspectives to consider. She shared that she has also benefited from advice to manage various situations in her life. She is also seeking techniques to build social connections with others.  Treatment Level   She would like biweekly appointments. Symptoms Kristen Leon tends to prioritize others and delay self-care. She reported struggling in several social situations. She also reported that she sometimes focuses on her ex-husband's life more than she would like.   Problems Addressed  Goals  Objective 1. Kristen Leon would like to learn to take better care of herself and prioritize her emotional needs.    Target Date: 09/26/2023  Frequency: Biweekly  Progress: 0 Modality: Individual therapy  2. Kristen Leon shared that she would like to be a more present and capable parent, and improve her parents skills  Target Date: 09/26/2023 Progress: 0 Frequency: Biweekly Modality: Individual Therapy 3. Kristen Leon would like to expand her social network and create social connections and friendships  Target Date: 09/26/2023 Progress: 0 Frequency: Biweekly Modality: Individual Therapy  Related Interventions Therapist will provide opportunities to process experiences in session. Therapist will help Kristen Leon to notice and disengage from maladaptive thoughts and behaviors using CBT-based strategies. Therapist will incorporate parent training strategies as appropriate. Therapist will engage Kristen Leon in a discussion of various social situations, including consideration of how to respond in a way that takes her toward her goals, and incorporating additional resources (including in-vivo role plays) as appropriate Therapist will help Kristen Leon to identify opportunities for increased social connection in her community. Therapist will provide referrals for additional resources as appropriate  Kristen Cruise, PhD            Kristen Cruise, PhD

## 2023-02-19 ENCOUNTER — Other Ambulatory Visit: Payer: Self-pay | Admitting: Psychiatry

## 2023-02-19 ENCOUNTER — Telehealth (INDEPENDENT_AMBULATORY_CARE_PROVIDER_SITE_OTHER): Payer: Federal, State, Local not specified - PPO | Admitting: Psychiatry

## 2023-02-19 ENCOUNTER — Encounter: Payer: Self-pay | Admitting: Psychiatry

## 2023-02-19 DIAGNOSIS — F422 Mixed obsessional thoughts and acts: Secondary | ICD-10-CM

## 2023-02-19 DIAGNOSIS — F3177 Bipolar disorder, in partial remission, most recent episode mixed: Secondary | ICD-10-CM

## 2023-02-19 DIAGNOSIS — F411 Generalized anxiety disorder: Secondary | ICD-10-CM | POA: Diagnosis not present

## 2023-02-19 DIAGNOSIS — F5105 Insomnia due to other mental disorder: Secondary | ICD-10-CM | POA: Diagnosis not present

## 2023-02-19 DIAGNOSIS — R7989 Other specified abnormal findings of blood chemistry: Secondary | ICD-10-CM

## 2023-02-19 DIAGNOSIS — Z79899 Other long term (current) drug therapy: Secondary | ICD-10-CM

## 2023-02-19 DIAGNOSIS — G3184 Mild cognitive impairment, so stated: Secondary | ICD-10-CM

## 2023-02-19 MED ORDER — LORAZEPAM 0.5 MG PO TABS: 0.5000 mg | ORAL_TABLET | Freq: Three times a day (TID) | ORAL | 1 refills | Status: DC | PRN

## 2023-02-19 MED ORDER — CLONIDINE HCL 0.1 MG PO TABS: 0.1000 mg | ORAL_TABLET | Freq: Two times a day (BID) | ORAL | 1 refills | Status: DC

## 2023-02-19 NOTE — Progress Notes (Signed)
Ramia Millender [id number] 1966/05/29 57 y.o.  Video Visit via My Chart  I connected with pt by My Chart video and verified that I am speaking with the correct person using two identifiers.   I discussed the limitations, risks, security and privacy concerns of performing an evaluation and management service by My Chart  and the availability of in person appointments. I also discussed with the patient that there may be a patient responsible charge related to this service. The patient expressed understanding and agreed to proceed.  I discussed the assessment and treatment plan with the patient. The patient was provided an opportunity to ask questions and all were answered. The patient agreed with the plan and demonstrated an understanding of the instructions.   The patient was advised to call back or seek an in-person evaluation if the symptoms worsen or if the condition fails to improve as anticipated.  I provided 30 minutes of video time during this encounter.  The patient was located at home and the provider was located office. Session started 1130-1200  Subjective:   Patient ID:  Kristen Leon is a 57 y.o. (DOB Jun 25, 1966) female.  Chief Complaint:  Chief Complaint  Patient presents with   Follow-up   Depression   Manic Behavior   Anxiety     Medication Refill Pertinent negatives include no chest pain.  Anxiety Symptoms include decreased concentration. Patient reports no chest pain, confusion, dizziness, nervous/anxious behavior, palpitations or suicidal ideas.    Depression        Associated symptoms include decreased concentration.  Associated symptoms include no suicidal ideas.    Kristen Leon presents  today for follow-up ofBipolar disorder, generalized anxiety disorder, nocturnal panic attacks, history of OCD, and mild cognitive impairment and worsening driving anxiety.  She has a goal of getting off the Seroquel in hopes of improving memory.  at visit August 06, 2019.   We needed to start a mood stabilizer and discussed variety of options and decided to start with Latuda.  We started at 20 mg and the plan was to increase gradually to 80 mg daily.  If that was successful this follow-up was going to lead to a potential reduction in an attempt to wean Seroquel. Never got up to 80 mg Latuda.    Did not noticed much of a difference.  visit September 03, 2019.  The following changes were made: Increase vitamin D from 5000 units daily to 10K units daily Start Latuda  80 mg daily Start reduction in Seroquel next week and reduce once every 3 weeks. Reduced Seroquel from 600 to 200 mg HS.  At first had sleep and mood problems and angry.  Added in  gabapentin 300 mg total week ago and sleep better.  Able to sleep on her own.  Mood is better also.  visit October 12, 2019.  The following changes were made: Increase vitamin D from 5000 units daily to 10K units daily Continue Latuda  80 mg daily Continue gradual reduction in Seroquel next week and reduce once every 3 to 4 weeks by 50 mg. Trying to get rid of gabapentin and Seroquel for cognitive reasons primarily  seen December 16, 2019.  The following was noted: Down to Seroquel 150 HS for 3 weeks.  Quality of sleepy slightly off.  How long on this dose?  Tends to wake 30 min earlier than usual.  Bought magnesium.   Feels fine in the day.  Slightly less harried and mentally cloudy with reduction in Seroquel. Likes  the Latuda as mood medication.   Overall mood and anxiety are under control.  Other concern is focus and memory. Wants to get off gabapentin and Seroquel for that reason.  Feels the meds may increase risk of dementia.  Overall she has seen some cognitive improvement since the reduction in Seroquel as noted. She was in courage to try reducing the Seroquel further gradually.  She was encouraged to leave the gabapentin at 300 mg daily because it was helpful.  seen March 14, 2020.  She was not doing well.  The  following was noted: Couldn't sleep without Seroquel 200 mg.  Felt unstable and anxious.  For weeks felt she still struggled at 200 mg Seroquel and raised the dose all the way back up to where she started bc felt desperate and was on edge.   Early 2000's not on Seroquel and had a lot of anxiety and obsessive thought.  It helps anxiety. Mood better on Seroquel and feels better physically and mentally.  A little down with stress and failure of switch to [place].  little anger but not much depression and no other sx of mania.    Driving anxiety and sense unreality resolved.  Fine with driving now. The following changes were made: Continue vitamin D from 5000 units daily to 10K units daily Wean Latuda to 40 mg for a week and then stop it. Then start Saphris 5mg  HS and reduce Seroquel 400 mg at night for 5 days, then increase Saphris 10 mg at night and reduce Seroquel to 200 mg at night for 5 days, then increase Saphris to 15 HS and stop sEroquel.  04/12/2020 appointment the following is noted: As noted pt has been wanting off Seroquel so med changes last visit wre intended to help. More anxious with reduced gabapentin and Seroquel.  Hard to stop Seroquel bc both anxiety and insomnia and using 100 mg Seroquel.  Missed a day of work over it yesterday.  More sleep with Saphris than Latuda.  No SE with saphris. Anxiety included fidgety. Hasn't tried Saphris 15 mg daily yet.   Has felt in a little less foggy headed with reduced Seroquel and gabapentin.  Down on gabapentin for a while now and only a little change with that reduction.  A little better focus and memory now. Neuropsych testing next week. Plan: Increase Saphris 15 mg HS and stop Seroquel again.  Can reduce Seroquel to 50 or even lower if needed.    05/09/2020 phone call with the following noted: Patient had to go back up to 600 mg Seroquel at hs, she has tried the last few nights at 500 mg. She's been unsuccessful tapering down and just wanted to  have 200 mg and 100 mg tablets available to try and get it lowered.   07/07/2020 appointment with the following noted: Got close to off Seroquel but felt untethered and trouble with sleep initial and terminal. 5-6 hours on low dose Seroquel. Never tried Saphris 15 mg for reasons that are unclear.  Stopped shortly after the phone session from last time. Started Seroquel by 2002 and gabapentin in 2003.  Finds it very difficult to get off the meds and worry over instability. Plan: Start Saphris 10 mg at night and reduce Seroquel to 400 mg nightly for 4 nights, Then increase Saphris to 15 mg at night and reduce Seroquel to 200 mg for 4 nights, Then reduce Seroquel to 100 mg and continue Saphris at 15 mg nightly for 4 nights, Then stop  Seroquel and if insomnia occurs, increase Saphris to 20 mg at night.  07/19/2020 phone call.  Restless legs on Saphris.  Given instructions to change to loxapine 100 mg nightly. 07/25/2020 phone call patient stating she wanted to try to continue Saphris but have treatment for the restless legs side effects. Prescription sent for ropinirole  09/13/2020 appointment with the following noted: Not taking Saphris.  At 10 mg HS CO jitteriness and anxiety the next day. Never took loxapine. Wonders about taking loxapine now.  Plan: Give her this change in instructions for transition from Seroquel to loxapine: Start loxapine 25 mg capsule 1 at night and reduce Seroquel to 400 mg nightly for 4 nights, Then increase loxapine to 2 capsules at night and reduce Seroquel to 200 mg for 4 nights, Then reduce Seroquel to 100 mg and increase loxapine to 3 capsules nightly for 4 nights, Then stop Seroquel and if insomnia occurs, increase loxapine to 4 capsules at night and notify MD.   Multiple phone calls since the last visit including the following. 10/24/2020:Telephone call to patient.  She experiences an increase in anxiety whenever she reduces the Seroquel.  She is just started the  transition from Seroquel to loxapine.  She is tolerating the loxapine well.  Encouraged her to just use the lorazepam and transition from Seroquel to loxapine.  It is hoped and expected that the loxapine will effectively manage anxiety as the Seroquel when she is on the full dose.  She agrees to the plan.  Lorazepam prescription sent in. 11/17/2020: RTC  See prior note.  Took 50 mg Seroquel last night.  Realized she made a mistake in dosing.  She stopped from 200 mg Seroquel instead of reducing to 100 mg as instructed.  Had gotten up to 4 loxapine without seroquel was jittery and like she'd come out of her skin and took lorazepam.  Still some problems with memory.  Last night took 50 mg Seroquel and was a little better. Much better today than yesterday.   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   Memory testing with visuospatial problems and was worked up over that.   Take 4 loxapine and start 75 mg Seroquel and taper from there more slowly. Appt 11/22/20  11/22/2020:Pt requesting Rx for Loxapine 25 mg 4/d  30 day and Seroquel 200 mg 3/d   30 day and Lorazepam 0.5 mg 1-2 tab every 8 hrs PRN for anxiety  @ [facility] on file. Apt RS from 12/21 to 2/9 and on canc list. Pt going out of town 12/27-1/3 asking for Rx before 12/27.  11/22/2020 MD response: I sent in the prescription for lorazepam.  Patient is not on quetiapine 200 mg 3 daily and I will not prescribe that dose.  Ask her what dose of quetiapine she is actually taking.  Also the insurance will not cover 4 loxapine capsules daily.  If she is taking 4 I will need to change it to 2 of the 50 mg capsules.  Please verify her current dose of loxapine and quetiapine.  I am weaning her off of quetiapine she should not be on that high of a dose of quetiapine. 11/23/2020:Pt called back stating she would like to stop Loxapine completely. She is having blurred vision and jittery. She did not pick up  Loxapine Rx. She is currently taking quetiapine 100 mg (4-25 mg tabs daily). Requesting to go back to 600 mg daily. Requesting Rx for  this.  Agreed 12/01/2020:Linsey called to request that you decrease her gabapentin and prescribe Buspar.  Please call to discuss this.  appt 01/11/21.  MD response: Took buspar years ago and wants to try it again and reduce gabapentin.  Currently on 1200 mg HS.  Reduce to 900 mg gabapentin.   Start buspirone 5 mg twice daily, and increase to 15 mg BID. Disc SE.  She agrees.  01/11/2021 appointment with the following noted: Completely off gabapentin for a couple of weeks maybe.  Initially anxiety a little high in AM.  Increased buspirone. Had increase anxiety when off Seroquel.  Was off about 4 days and memory was not better.  Anxiety is better now. Anxiety is a little higher off gabapentin but not unmanageable.  Sleep is not as good off the gabapentin.  More initial insomnia and takes melatonin to manage. Not marked difference with cognition off gabapentin. Read about antipsychotics reducing brain volume and still wants to stop Seroquel despite multiple failed attempts. Wants to switch from Seroquel to trazodone.  Plan: OK trial Seroquel reduction to 400 mg daily with no other med changes.  02/17/2021 appointment with the following noted: Stopped buspirone without change. Stopped gabapentin. Reduced Seroquel to 400 mg HS. She is convinced antipsychotic is causing brain loss.  She wants to be off antipsychotics bc of this concern and risk of dementia.  Don't think fears of dementia are unfounded. Doesn't believe she has psychotic sx. Wants to retry lithium and then trazodone in place of Seroquel.  Took lithium a long time.   Acknowledges prior failures weaning Seroquel multiple times but thinks maybe it was too fast.  Sleep most of the night and sleep less deep than 600 mg Seroquel.  Also less deep without gabapentin.  Altogether 6-7 hours but leaves the TV on.    In bed 8 watching TV and doesn't try to sleep until 1030. Plan: She  failed attempts to get off Seroquel multiple times. OK trial Seroquel reduction to 300 mg daily for 3-4 weeks, then 200 mg for 3-4 weeks. Disc withdrawal effects from Seroquel. Start lithium 300 mg nightly for a week then 600 mg nightly. Wait a week and get blood level.    04/12/2021 appointment with the following noted: Did get down to Seroquel 200 mg HS and up to lithium 600 HS and trazodone 100 mg HS. Sleep more interrupted but 6-7 hours with poor quality and less well rested with change.  Mood really good with lithium and maybe better. Often had rapid cycling and it seemed to stop. But "sleep sucks" and feels it affects her during the day Still takes mirtazapine 15 mg and added trazodone.  Sleep worse without trazodone. No SE with lithium. Cognition  more clear with better focus with less Seroquel.   Patient reports stable mood and denies depressed or irritable moods.  Patient denies any recent difficulty with anxiety, surprisingly. Denies appetite disturbance.  Patient reports that energy and motivation have been good.  Patient denies any difficulty with concentration.  Patient denies any suicidal ideation.  Plan: Continue Seroquel  200 mg for 3-4 weeks. Continue lithium 600 mg nightly. Check another level  Stop mirtazapine and trazodone Doxepin 10-30 mg HS   04/24/2021 phone call complaining of obsessive thinking and wanting medicine sent into the pharmacy. MD response: Her case is very complicated because she has complained of side effects from atypicals and worried about cognitive side effects of benzodiazepines.  She is bipolar and therefore we cannot use  SSRIs.  She does not want to take atypical antipsychotics.  She is on a low dose of Seroquel now and I am sure does not want to increase the dosage back up.  She has a as needed prescription for lorazepam she can use that.  I will not make any other major med  changes over the phone outside of a scheduled appointment.  If the symptoms are bothersome enough however going to the cancellation list and we will attempt to get her into an appointment sooner.  06/01/2021 appointment with the following noted: Thinks she stopped trazodone and ? Took doxepin.  She vaguely think she tried it and it didn't help sleep. Mood more stable on lithium 600 HS.  Prior mood cycling into dep and mania have resolved on it.. Still problems with her sleep. Increased Seroquel to 400 mg on her own for sleep added gabapentin 600 and sleep is better but wants off both of them. Still on mirtazapine 30.   Cholecystectomy scheduled for August. Had stopped Seroquel and then had intrusive thoughts which have now resolved.  At the time she called they were bad. Plan: Continue lithium 600 mg nightly. Check another level before next appt Wants to try again to taper quetiapine, will proceed as follows: Stop mirtazapine DT NR Retry Doxepin at higher dose of 50 mg up to 100 mg HS Once sleeping well then try to taper Seroquel very slowly such as reduce by 50 mg every 2 to 3 weeks or even slower if necessary.  09/05/2021 appointment with the following noted: Got down to Seroquel 200 and taking gabapentin 600 mg HS. Never tried 50-100 mg for sleep. Off track for 6 weeks without enough sleep.  Cholecystectomy recently. On phone with BF too much at night talking to him bc of his work schedule.   More irritable and tired.   Older cousin moved in for 3 mos and stressed by that and esp bc he brought a dog.  He's taking advantage of her and brought his GF. Stopped lithium AMA bc "I was afraid of it".   I want to go back on lithium bc it helped and retry doxepin at higher dose and try wean Seroquel.  Then wean gabapenin.   Was more focused and better mood stability on lithium. Mirtazapine does not help sleep much. Paranoid about being hurt if she dates and no history of it.  Develop a whole  story about what might happen. Plan: Stop mirtazapine DT NR Retry Doxepin at higher dose of 50 mg up to 100 mg HS Once sleeping well then try to taper Seroquel very slowly such as reduce by 50 mg every 2 to 3 weeks or even slower if necessary. Restart lithium 600 mg nightly. Check another level before next appt Don't stop meds AMA.   Call if there's a problem.  11/16/21 appt noted: Second week of Seroquel 150 mg daily. Is sleeping currently but not the same quality but not terrible.  On doxepin 75 mg HS Mood is good.  Likes the lithium better than Seroquel for mood.  Fewer hypomanic episones and more stable with less anger. Never got lithium level.  Says she'll do so. Rare lorazepam. No SE right now. Asks about Ozempic. Plan: continue Doxepin at higher dose of 50 mg up to 100 mg HS Once sleeping well then try to taper Seroquel very slowly such as reduce by 50 mg every 2 to 3 weeks or even slower if necessary. Continue lithium 600 mg nightly.  Get lithium level ASAP.  Then repeat lithium level with every 20 # of weight loss Don't stop meds AMA.   Call if there's a problem.  01/18/2022 appointment with the following noted: Ran out of doxepin and says refill rejected for 3 weeks. Was feeling kind of down. Down to Seroquel 25 mg HS for 2 + weeks. Still sleeping with some awakening.  Surprising good sleep.  6 and 1/2 hours of sleep and not drowsy daytime. Mood is fine.   Not sure if doxepin helped sleep but would like to have it. Patient reports stable mood and denies depressed or irritable moods.  Patient denies any recent difficulty with anxiety.  Patient denies difficulty with sleep initiation or maintenance. Denies appetite disturbance.  Patient reports that energy and motivation have been good.  Patient denies any difficulty with concentration.  Patient denies any suicidal ideation. Plan no med chages  04/17/22 appt noted: Takes gabapentin prn sleep and it helps. Felt dull on lithium  and U frequency so reduced it to 1of 300 mg  lithium daily. Down to Seroquel 25 mg HS and Sonata on occasion. Feels less dull with less lithium.  Thinks she was more depressed on lihtium at 600 mg daily.  Feels better the last 5 days.  Less urinary frequency. Sleep is less deep with less Seroquel and shorter duration 6 hours.  It's not terrrible but not great. 100 times better cognitively with less Seroquel.   Noticed it at work. Anxiety in check and she's surprised. Doesn't want med changes today. Plan; Cogniton better with less serotquel 25 mg HS. She wants to try lorazepam 0.5 mg HS in place of Sonata to see if she can sleep longer. Continue lithium 600 mg nightly was recommended but she dropped it to 300 mg nightly because she complained of dullness at the 600 mg and perhaps even some depression.  We agreed on a compromise of 450 mg nightly.  Even that will be below the usual therapeutic range and there is significant risk of mood instability and relapse with this and she accepts that risk.   07/04/22 appt noted: Using lorazepam about every 3 mos. Taking Sonata more about 1/2 the time. Taking quetiapine 25 mg nightly and lithium 450 mg daily. Don't think meds working out for her.  Not taking lithium enough.  More hypomania and sleep not enough nor restorative. Hypomania includes talking to herself a lot and tell funny stories to herself. Excitable mood and speech. More energy and need to burn it off.  Therapist noticed.   EMA.  About 5 hours sleep since off more Seroquel. On more lithium had less hypomania but felt flat on more lithium.  Still frequent urination.   Saw a urologist. Ativan not helpful sleep Plan: Cogniton complaints resolved with less serotquel 25 mg HS. But not sleeping.   Option switch to Thorazine 25 mg for less hangover.  She will call in a couple of weeks and if sleep is not better we will switch to the Thorazine.  She understands this is not an antipsychotic at these low  dosages but it can have other side effects like dizziness or drowsiness. Increase lithium to 450/600 mg QOD bc hypomania at 450 daily and dullness at 600 mg daily. Check lithium level in 2 weks.   09/07/22 NS  10/19/2022 appointment noted: Chlorpromazine made her feel wired and only taken once or twice and stopped. Still trouble with sleep unless takes more Seroquel.  Seroquel helps her go to  sleep but not stay asleep. Thinks lithium is dulling and more periods of hypomania.  Wants to stay on lithium bc doesn't want to take antipsychotic for mood stabilizer.  Don't feel as connected to people on it bc feels flat.  Hard to feel emotional about things.  On Seroquel felt like personality was more normal.  Cognition definitely better off the Seroquel. Sonata about once every 3 weeks.    Avg 5 hours of sleep nightly without napping.  Not drowsy but is tired during the day.  Can't sleep withoout meds. Has been having a lot of hypomania but no one else is complaining.  More than I usually have and can notice it at work.  More chatty and more inclined to be outspoken and borderline inappopriate.  No anger problems.   Not depressed much ever.   Gall bladder removed last year and GI Sx ongoing with ongoing workup.  GI problems predate lithium she thinks.   02/19/23 appt noted:  Switch lithium in AM 2 weeks ago and less compliant.  Mood less stable and more irritable.  CO polyuria and nocturia. Sleep impacted by not usually taking quetiapine.  Sleep irregular 5-6 hours.  Recognizes she can be rude to her kids.. I'm not open to an antipsychotic.   Only taking lithium about 300 mg 3 days in last week. Taking ativan helped her feel much better.  She wants to take one of the 0.5 mg in the AM to calm her irritability.    When manic feels more reckless, spending, talk to herself, driven, reduced sleep, irritable and angry.  Past Psychiatric Medication Trials: Vraylar 3 SE, Abilify, brief Latuda ? effect,   Depakote NR, carbamazepine questionable side effects, lamotrigine lost response, Trileptal,  lithium , clozapine, olanzapine , Seroquel 600, risperidone 1 mg twice daily  , Saphris CO anxiety  Gabapentin cog SE,  Trazodone 200, doxepin 30 not sedating, mirtazapine,  Thorazine 25 mg NR and activated Lorazepam 1 mg poor response for sleep  several SSRIs, clomipramine had vision side effects,   failed an attempt to switch from Seroquel to [place] early 2020  Failed switch from Seroquel to [place], Saphris, Vraylar and loxapine No psych hosp.  Review of Systems:  Review of Systems  Cardiovascular:  Negative for chest pain and palpitations.  Gastrointestinal:  Negative for diarrhea.  Genitourinary:  Positive for frequency.  Neurological:  Negative for dizziness and tremors.  Psychiatric/Behavioral:  Positive for agitation, decreased concentration, depression, dysphoric mood and sleep disturbance. Negative for behavioral problems, confusion, hallucinations, self-injury and suicidal ideas. The patient is not nervous/anxious and is not hyperactive.     Medications: I have reviewed the patient's current medications.  Current Outpatient Medications  Medication Sig Dispense Refill   Acetylcysteine (NAC) 600 MG CAPS Take 1 capsule (600 mg total) by mouth daily. 90 capsule 1   atenolol (TENORMIN) 50 MG tablet Take 50 mg by mouth at bedtime.     B Complex Vitamins (VITAMIN B-COMPLEX) TABS Take 1 tablet by mouth at bedtime.     Calcium Carbonate-Vitamin D (CALCIUM-D PO) Take by mouth at bedtime.     Cholecalciferol (VITAMIN D3) 5000 units CAPS Take 1 capsule by mouth at bedtime.     cloNIDine (CATAPRES) 0.1 MG tablet Take 1 tablet (0.1 mg total) by mouth 2 (two) times daily. 60 tablet 1   ESTRING 7.5 MCG/24HR vaginal ring      folic acid (FOLVITE) A999333 MCG tablet Take 400 mcg by mouth at bedtime.  levothyroxine (SYNTHROID, LEVOTHROID) 112 MCG tablet Take 112 mcg by mouth at bedtime.      linaclotide (LINZESS) 290 MCG CAPS capsule Take 290 mcg by mouth at bedtime.     lithium carbonate 150 MG capsule Take 1 capsule (150 mg total) by mouth daily. 90 capsule 0   lithium carbonate 300 MG capsule TAKE 2 CAPSULES DAILY 180 capsule 0   MAGNESIUM PO Take 400 mg by mouth at bedtime.     memantine (NAMENDA) 10 MG tablet Take 1 tablet (10 mg total) by mouth 2 (two) times daily. 180 tablet 0   temazepam (RESTORIL) 15 MG capsule      zaleplon (SONATA) 10 MG capsule TAKE 1 CAPSULE AT BEDTIME  AS NEEDED FOR SLEEP 90 capsule 0   cefadroxil (DURICEF) 1 g tablet Take 1 tablet (1 g total) by mouth 2 (two) times daily. (Patient not taking: Reported on 02/12/2023) 14 tablet 0   chlorproMAZINE (THORAZINE) 25 MG tablet Take 1 tablet (25 mg total) by mouth at bedtime. (Patient not taking: Reported on 10/09/2022) 30 tablet 0   fluticasone (FLONASE) 50 MCG/ACT nasal spray Place into the nose. (Patient not taking: Reported on 02/19/2023)     lithium carbonate 150 MG capsule Take 1 capsule (150 mg total) by mouth daily. (Patient not taking: Reported on 02/19/2023) 90 capsule 1   LORazepam (ATIVAN) 0.5 MG tablet Take 1-2 tablets (0.5-1 mg total) by mouth every 8 (eight) hours as needed for anxiety. 60 tablet 1   metroNIDAZOLE (FLAGYL) 500 MG tablet Take 1 tablet (500 mg total) by mouth 2 (two) times daily. (Patient not taking: Reported on 02/12/2023) 14 tablet 0   QUEtiapine (SEROQUEL) 25 MG tablet Take 1 tablet (25 mg total) by mouth at bedtime. (Patient not taking: Reported on 02/19/2023) 90 tablet 0   tretinoin (RETIN-A) 0.025 % cream Apply topically at bedtime. face (Patient not taking: Reported on 02/12/2023)     No current facility-administered medications for this visit.    Medication Side Effects: None  Allergies: No Known Allergies  Past Medical History:  Diagnosis Date   Anxiety    Bipolar 1 disorder ([place])    followed by dr c. cottle   Carpal tunnel syndrome on both sides    CKD (chronic kidney  disease), stage II    followed by pcp   Endometriosis    Hypothyroidism, postablative    endocrinologist--- dr c. Barnet Pall--- dx toxic multinodular thyroid 2000 w/ hyperthyroidism s/p RAI  2000, 2002, and 2004;  post hypothryoid in 2008;   last bx 2012 left side, benign per pt   Irritable bowel syndrome with constipation    followed by dr w. Glennon Hamilton ([facility] Bruns)   MDD (major depressive disorder)    Multinodular thyroid    in 2000 dx toxic post ablative   NAFLD (nonalcoholic fatty liver disease)    OSA (obstructive sleep apnea)    05-18-2021  per pt study done approx. 2018, told moderat OSA,  does uses cpap   Tachycardia    cardiologist--- Sigurd Sos NP @ [facility] in [place],  w/ palpitations, controlled w/ atenolol;  pt has had work-up per note echo 2014 normal ,  event monitor 2018 showedST w/ mild ST no arrhythmia,  ETT 02/ 2022 normal no ischemia, ef 67%   Wears contact lenses     Family History  Problem Relation Age of Onset   Thyroid disease Mother    Diabetes Mother    Hypertension Mother     Social History  Socioeconomic History   Marital status: Divorced    Spouse name: Not on file   Number of children: Not on file   Years of education: Not on file   Highest education level: Not on file  Occupational History   Not on file  Tobacco Use   Smoking status: Former    Years: 5    Types: Cigarettes    Quit date: 2000    Years since quitting: 24.2   Smokeless tobacco: Never  Vaping Use   Vaping Use: Never used  Substance and Sexual Activity   Alcohol use: Yes    Alcohol/week: 1.0 standard drink of alcohol    Types: 1 Standard drinks or equivalent per week   Drug use: Never   Sexual activity: Not on file  Other Topics Concern   Not on file  Social History Narrative   Not on file   Social Determinants of Health   Financial Resource Strain: Not on file  Food Insecurity: Not on file  Transportation Needs: Not on file  Physical Activity: Not on file  Stress: Not on file   Social Connections: Not on file  Intimate Partner Violence: Not on file    Past Medical History, Surgical history, Social history, and Family history were reviewed and updated as appropriate.   Please see review of systems for further details on the patient's review from today.   Objective:   Physical Exam:  There were no vitals taken for this visit.  Physical Exam Neurological:     Mental Status: She is alert and oriented to person, place, and time.     Cranial Nerves: No dysarthria.  Psychiatric:        Attention and Perception: She is inattentive.        Mood and Affect: Mood is anxious. Mood is not depressed.        Speech: Speech normal. Speech is not rapid and pressured or slurred.        Behavior: Behavior is cooperative.        Thought Content: Thought content is not paranoid or delusional. Thought content does not include homicidal or suicidal ideation. Thought content does not include suicidal plan.        Cognition and Memory: She does not exhibit impaired recent memory or impaired remote memory.        Judgment: Judgment normal.     Comments: Insight and judgment appear good. Irritable. No significant pressured speech or flight of ideas     Lab Review:     Component Value Date/Time   NA 136 04/10/2021 1035   K 4.3 04/10/2021 1035   CL 97 04/10/2021 1035   CO2 25 04/10/2021 1035   GLUCOSE 77 04/10/2021 1035   BUN 13 04/10/2021 1035   CREATININE 0.94 04/10/2021 1035   CALCIUM 9.7 04/10/2021 1035       Component Value Date/Time   WBC 6.6 05/24/2021 0630   RBC 3.79 (L) 05/24/2021 0630   HGB 11.3 (L) 05/24/2021 0630   HCT 35.0 (L) 05/24/2021 0630   PLT 325 05/24/2021 0630   MCV 92.3 05/24/2021 0630   MCH 29.8 05/24/2021 0630   MCHC 32.3 05/24/2021 0630   RDW 13.2 05/24/2021 0630    Lithium Lvl  Date Value Ref Range Status  10/10/2022 0.6 0.5 - 1.2 mmol/L Final    Comment:    A concentration of 0.5-0.8 mmol/L is advised for long-term  use; concentrations of up to 1.2 mmol/L may be necessary during acute  treatment.                                  Detection Limit = 0.1                           <0.1 indicates None Detected   12/05/2021 lithium level 0.6 on 600 mg daily stable  04/10/2021 lithium level 0.8 on 600 mg daily.  No results found for: "PHENYTOIN", "PHENOBARB", "VALPROATE", "CBMZ"   Took lab orders to PCP from visit in September and reports results: B12 >2000 Folate > 20 D 43.5 Iron TIBC 297 (250-450)  UIBC 156 (131-425)  Iron 141 (27-159)  Iron saturation 47 (15-55)   .res Assessment: Plan:    Bipolar disorder, in partial remission, most recent episode mixed ([place]) - Plan: cloNIDine (CATAPRES) 0.1 MG tablet, LORazepam (ATIVAN) 0.5 MG tablet  Generalized anxiety disorder - Plan: LORazepam (ATIVAN) 0.5 MG tablet  Mixed obsessional thoughts and acts - Plan: LORazepam (ATIVAN) 0.5 MG tablet  Insomnia due to mental condition  Lithium use  MCI (mild cognitive impairment)  Low vitamin D level   Greater than 50% of 30-minute video time with patient was spent on counseling and coordination of care. We discussed nature of her psychiatric diagnoses .   She was motivated to get off Seroquel because she believes it might cause cognitive problems and fears brain matter loss.  Bipolar disorder Was worse with reduction in Seroquel until addtion of lithium helped and earlier She felt lithium had provided better mood stability than Seroquel. But she complained of dullness on lithium 600 and then became noncompliant and mood sx are worse with irritablity.  She refuses to increase it back to therapeutic, though we disc at length amiloride might remedy the polyuria.    Counseled patient regarding potential benefits, risks, and side effects of lithium to include potential risk of lithium affecting thyroid and renal function.  Discussed need for periodic lab monitoring to determine drug level and to assess for potential  adverse effects.  Counseled patient regarding signs and symptoms of lithium toxicity and advised that they notify office immediately or seek urgent medical attention if experiencing these signs and symptoms.  Patient advised to contact office with any questions or concerns.  Hypomania disc extensively.  Currently irritable because of a low dose of lithium.   She doesn't want to increase the lithium despite some ongoing hypomania.  We discussed alternatives including switching to an antipsychotic type mood stabilizer.  She has been on all the traditional mood stabilizers that are not antipsychotics.   She does not want to try another antipsychotic mood stabilizer at this time though there are options like Caplyta. But she doesn't to stay on lithium nor does she agree to antipsychotic.   Disc my recommendation for standard care as above but  The only reasonable option would be off label clonidine for irritability though this is not a traditional mood stabilize r and may fail.  Disc SE  She wants to continue to use low dose Seroquel 25 mg HS until sometihing else works.  Normal CR and CA in 8/23 at [facility] 12/05/2021 lithium level 0.6 on 600 mg daily stable Option amiloride to manage polyuria but she doesn't want to stay on lithium anyway.  If we could get a higher lithium level it might work.  Don't stop meds AMA.   Call if there's  a problem.  Sleep hygiene incl no bed without sleep.  Sleep options Belsomra, Dayvigo, doxepin, Rozerem, hydroxyzine.  Prefer avoid BZ and bc cognitive concerns she has. Wants Sonata rarely.  Option to stop memantine 10 BID off label for cognitive problems.  Because the cognitive problems resolved with reduction in Seroquel and lithium and the onset of hypomania.  Discussed potential metabolic side effects associated with atypical antipsychotics, as well as potential risk for movement side effects. Advised pt to contact office if movement side effects occur.   Thyroid  was normal recently. B12 and folate are normal D is OK but goal with her should be 50s-60s Disc reasons in detail including Dr. Missy Sabins recommendation. Marland Kitchen  gave neuroprotective literature on lithium. Dr. Olene Floss article and gave article before.    Clonidine 0.1 mg 1/2 tablet BID and up to 0.1 mg BID off label for manic irritability.  Disc SE .  Rec a traditi  FU 2 mos  Lynder Parents, MD, DFAPA   Please see After Visit Summary for patient specific instructions.  Future Appointments  Date Time Provider [place]  02/27/2023  9:00 AM Gaynell Face, Dorinda Hill, PhD [facility] None  03/15/2023  8:00 AM Mendelson, Dorinda Hill, PhD [facility] None  03/27/2023  9:00 AM Mendelson, Dorinda Hill, PhD [facility] None      No orders of the defined types were placed in this encounter.      -------------------------------

## 2023-02-20 ENCOUNTER — Other Ambulatory Visit: Payer: Self-pay

## 2023-02-20 ENCOUNTER — Telehealth: Payer: Self-pay | Admitting: Psychiatry

## 2023-02-20 DIAGNOSIS — F3177 Bipolar disorder, in partial remission, most recent episode mixed: Secondary | ICD-10-CM

## 2023-02-20 DIAGNOSIS — F422 Mixed obsessional thoughts and acts: Secondary | ICD-10-CM

## 2023-02-20 DIAGNOSIS — F411 Generalized anxiety disorder: Secondary | ICD-10-CM

## 2023-02-20 MED ORDER — LORAZEPAM 0.5 MG PO TABS: 0.5000 mg | ORAL_TABLET | Freq: Three times a day (TID) | ORAL | 0 refills | Status: DC | PRN

## 2023-02-20 MED ORDER — CLONIDINE HCL 0.1 MG PO TABS: 0.1000 mg | ORAL_TABLET | Freq: Two times a day (BID) | ORAL | 0 refills | Status: DC

## 2023-02-20 NOTE — Telephone Encounter (Signed)
Canceled both Rx at [facility]. Sent/pended.

## 2023-02-20 NOTE — Telephone Encounter (Signed)
Pt LVM requesting Rx Clonidine and Lorazepam send to [facility] mail order. For 90 day. [facility] back order for Clonindine anyway.  Pt contact # [phone]

## 2023-02-27 ENCOUNTER — Ambulatory Visit (INDEPENDENT_AMBULATORY_CARE_PROVIDER_SITE_OTHER): Payer: Federal, State, Local not specified - PPO | Admitting: Clinical

## 2023-02-27 DIAGNOSIS — F3177 Bipolar disorder, in partial remission, most recent episode mixed: Secondary | ICD-10-CM | POA: Diagnosis not present

## 2023-02-27 NOTE — Progress Notes (Signed)
Time: 9:00 am-9:58 am CPT code: [id number] Diagnosis Code: Bipolar Disorder, in partial Remission  Kristen Leon was seen remotely using secure video conferencing. She was in her home and therapist was in her office at the time of the appointment. Session focused on her relationships, and desire for closer friendships. For homework, she will make 2 overtures per week toward coworkers with the goal of deepening personal connections. Therapist encouraged her to notice whether her anxiety decreases with increased frequency of attempts. She is scheduled to be seen again in two weeks.  Treatment Plan Client Abilities/Strengths  Kristen Leon is honest and able to articulate her feelings and experiences. She has a history of consistently attending therapy appointment. She described herself as willing to engage in therapy homework. She has found that she benefits from being reminded of her goals, as she sometimes has a tendency to lose focus on her goals over time.   Client Treatment Preferences:  Kristen Leon prefers Monday and Tuesday appointments-this is best for her work schedule. Time availability varies day to day. She does not have a preference between virtual and in person. Client Statement of Needs Kristen Leon shared that she is seeking encouragement in therapy, as well as having thoughts challenged and being presented with alternate perspectives to consider. She shared that she has also benefited from advice to manage various situations in her life. She is also seeking techniques to build social connections with others.  Treatment Level   She would like biweekly appointments. Symptoms Kristen Leon tends to prioritize others and delay self-care. She reported struggling in several social situations. She also reported that she sometimes focuses on her ex-husband's life more than she would like.   Problems Addressed  Goals  Objective 1. Kristen Leon would like to learn to take better care of herself and prioritize her emotional  needs.    Target Date: 09/26/2023 Frequency: Biweekly  Progress: 0 Modality: Individual therapy  2. Kristen Leon shared that she would like to be a more present and capable parent, and improve her parents skills  Target Date: 09/26/2023 Progress: 0 Frequency: Biweekly Modality: Individual Therapy 3. Kristen Leon would like to expand her social network and create social connections and friendships  Target Date: 09/26/2023 Progress: 0 Frequency: Biweekly Modality: Individual Therapy  Related Interventions Therapist will provide opportunities to process experiences in session. Therapist will help Kristen Leon to notice and disengage from maladaptive thoughts and behaviors using CBT-based strategies. Therapist will incorporate parent training strategies as appropriate. Therapist will engage Kristen Leon in a discussion of various social situations, including consideration of how to respond in a way that takes her toward her goals, and incorporating additional resources (including in-vivo role plays) as appropriate Therapist will help Fotini to identify opportunities for increased social connection in her community. Therapist will provide referrals for additional resources as appropriate        Myrtie Cruise, PhD               Myrtie Cruise, PhD

## 2023-03-15 ENCOUNTER — Other Ambulatory Visit: Payer: Self-pay | Admitting: Psychiatry

## 2023-03-15 ENCOUNTER — Ambulatory Visit (INDEPENDENT_AMBULATORY_CARE_PROVIDER_SITE_OTHER): Payer: Federal, State, Local not specified - PPO | Admitting: Clinical

## 2023-03-15 ENCOUNTER — Ambulatory Visit: Payer: Federal, State, Local not specified - PPO | Admitting: Clinical

## 2023-03-15 DIAGNOSIS — F3162 Bipolar disorder, current episode mixed, moderate: Secondary | ICD-10-CM

## 2023-03-15 DIAGNOSIS — F5105 Insomnia due to other mental disorder: Secondary | ICD-10-CM

## 2023-03-15 DIAGNOSIS — G3184 Mild cognitive impairment, so stated: Secondary | ICD-10-CM

## 2023-03-15 DIAGNOSIS — F3177 Bipolar disorder, in partial remission, most recent episode mixed: Secondary | ICD-10-CM | POA: Diagnosis not present

## 2023-03-15 NOTE — Progress Notes (Signed)
Time: 8:00 am-8:58 am CPT code: 88875Z-97 Diagnosis Code: Bipolar Disorder, in partial Remission  Kristen Leon was seen remotely using secure video conferencing. She was in a parked car outside of the [employer], and the therapist was in her home at the time of the appointment. She reported an improvement in overall mood and increased sense of empowerment in her social relationships. Session focused on continuing to explore how she can feel more connected and less lonely in the work setting. She created a plan to reach out to a coworker who has previously expressed openness to connecting further. She is scheduled to be seen again in two weeks.  Treatment Plan Client Abilities/Strengths  Kristen Leon is honest and able to articulate her feelings and experiences. She has a history of consistently attending therapy appointment. She described herself as willing to engage in therapy homework. She has found that she benefits from being reminded of her goals, as she sometimes has a tendency to lose focus on her goals over time.   Client Treatment Preferences:  Kristen Leon prefers Monday and Tuesday appointments-this is best for her work schedule. Time availability varies day to day. She does not have a preference between virtual and in person. Client Statement of Needs Kristen Leon shared that she is seeking encouragement in therapy, as well as having thoughts challenged and being presented with alternate perspectives to consider. She shared that she has also benefited from advice to manage various situations in her life. She is also seeking techniques to build social connections with others.  Treatment Level   She would like biweekly appointments. Symptoms Adrienna tends to prioritize others and delay self-care. She reported struggling in several social situations. She also reported that she sometimes focuses on her ex-husband's life more than she would like.   Problems Addressed  Goals  Objective 1. Kristen Leon would like to  learn to take better care of herself and prioritize her emotional needs.    Target Date: 09/26/2023 Frequency: Biweekly  Progress: 0 Modality: Individual therapy  2. Kristen Leon shared that she would like to be a more present and capable parent, and improve her parents skills  Target Date: 09/26/2023 Progress: 0 Frequency: Biweekly Modality: Individual Therapy 3. Kristen Leon would like to expand her social network and create social connections and friendships  Target Date: 09/26/2023 Progress: 0 Frequency: Biweekly Modality: Individual Therapy  Related Interventions Therapist will provide opportunities to process experiences in session. Therapist will help Kristen Leon to notice and disengage from maladaptive thoughts and behaviors using CBT-based strategies. Therapist will incorporate parent training strategies as appropriate. Therapist will engage Kristen Leon in a discussion of various social situations, including consideration of how to respond in a way that takes her toward her goals, and incorporating additional resources (including in-vivo role plays) as appropriate Therapist will help Kristen Leon to identify opportunities for increased social connection in her community. Therapist will provide referrals for additional resources as appropriate           Kristen [name], PhD               Kristen [name], PhD

## 2023-03-27 ENCOUNTER — Ambulatory Visit (INDEPENDENT_AMBULATORY_CARE_PROVIDER_SITE_OTHER): Payer: Federal, State, Local not specified - PPO | Admitting: Clinical

## 2023-03-27 DIAGNOSIS — F3177 Bipolar disorder, in partial remission, most recent episode mixed: Secondary | ICD-10-CM

## 2023-03-27 NOTE — Progress Notes (Signed)
Time: 9:00 am-9:58 am CPT code: [id number]-30 Diagnosis Code: Bipolar Disorder, in partial Remission  Kristen Leon was seen remotely using secure video conferencing. She was in her home and therapist was in her office at the time of the appointment. Session focused on her relationship with her children. She expressed a desire to be softer toward them, and to spend more intentional quality time with them. Therapist engaged her in discussion of dynamics of her childhood that may impact her parenting, as well as how she might achieve these goals. She is scheduled to be seen again in two weeks.  Treatment Plan Client Abilities/Strengths  [name] is honest and able to articulate her feelings and experiences. She has a history of consistently attending therapy appointment. She described herself as willing to engage in therapy homework. She has found that she benefits from being reminded of her goals, as she sometimes has a tendency to lose focus on her goals over time.   Client Treatment Preferences:  [name] prefers Monday and Tuesday appointments-this is best for her work schedule. Time availability varies day to day. She does not have a preference between virtual and in person. Client Statement of Needs [name] shared that she is seeking encouragement in therapy, as well as having thoughts challenged and being presented with alternate perspectives to consider. She shared that she has also benefited from advice to manage various situations in her life. She is also seeking techniques to build social connections with others.  Treatment Level   She would like biweekly appointments. Symptoms [name] tends to prioritize others and delay self-care. She reported struggling in several social situations. She also reported that she sometimes focuses on her ex-husband's life more than she would like.   Problems Addressed  Goals  Objective 1. [name] would like to learn to take better care of herself and prioritize her  emotional needs.    Target Date: 09/26/2023 Frequency: Biweekly  Progress: 0 Modality: Individual therapy  2. [name] shared that she would like to be a more present and capable parent, and improve her parents skills  Target Date: 09/26/2023 Progress: 0 Frequency: Biweekly Modality: Individual Therapy 3. [name] would like to expand her social network and create social connections and friendships  Target Date: 09/26/2023 Progress: 0 Frequency: Biweekly Modality: Individual Therapy  Related Interventions Therapist will provide opportunities to process experiences in session. Therapist will help [name] to notice and disengage from maladaptive thoughts and behaviors using CBT-based strategies. Therapist will incorporate parent training strategies as appropriate. Therapist will engage [name] in a discussion of various social situations, including consideration of how to respond in a way that takes her toward her goals, and incorporating additional resources (including in-vivo role plays) as appropriate Therapist will help [name] to identify opportunities for increased social connection in her community. Therapist will provide referrals for additional resources as appropriate       Kristen [name], PhD               Kristen [name], PhD

## 2023-04-17 ENCOUNTER — Ambulatory Visit (INDEPENDENT_AMBULATORY_CARE_PROVIDER_SITE_OTHER): Payer: Federal, State, Local not specified - PPO | Admitting: Clinical

## 2023-04-17 DIAGNOSIS — F3177 Bipolar disorder, in partial remission, most recent episode mixed: Secondary | ICD-10-CM

## 2023-04-17 NOTE — Progress Notes (Signed)
Time: 10:00 am-10:58 am CPT code: [id number]-78 Diagnosis Code: Bipolar Disorder, in partial Remission  Kristen Leon was seen remotely using secure video conferencing. She was in her home and therapist was in her office at the time of the appointment. Session focused on continuing to unpack her relationships with her children. Therapist suggested communication strategies, as well as "catching them being good," and making an effort to point out when they communicate effectively. Therapist also offered validation and support. She is scheduled to be seen again in two weeks.  Treatment Plan Client Abilities/Strengths  Kristen Leon is honest and able to articulate her feelings and experiences. She has a history of consistently attending therapy appointment. She described herself as willing to engage in therapy homework. She has found that she benefits from being reminded of her goals, as she sometimes has a tendency to lose focus on her goals over time.   Client Treatment Preferences:  Kristen Leon prefers Monday and Tuesday appointments-this is best for her work schedule. Time availability varies day to day. She does not have a preference between virtual and in person. Client Statement of Needs Kristen Leon shared that she is seeking encouragement in therapy, as well as having thoughts challenged and being presented with alternate perspectives to consider. She shared that she has also benefited from advice to manage various situations in her life. She is also seeking techniques to build social connections with others.  Treatment Level   She would like biweekly appointments. Symptoms Kristen Leon tends to prioritize others and delay self-care. She reported struggling in several social situations. She also reported that she sometimes focuses on her ex-husband's life more than she would like.   Problems Addressed  Goals  Objective 1. Kristen Leon would like to learn to take better care of herself and prioritize her emotional needs.     Target Date: 09/26/2023 Frequency: Biweekly  Progress: 0 Modality: Individual therapy  2. Kristen Leon shared that she would like to be a more present and capable parent, and improve her parents skills  Target Date: 09/26/2023 Progress: 0 Frequency: Biweekly Modality: Individual Therapy 3. Kristen Leon would like to expand her social network and create social connections and friendships  Target Date: 09/26/2023 Progress: 0 Frequency: Biweekly Modality: Individual Therapy  Related Interventions Therapist will provide opportunities to process experiences in session. Therapist will help Kristen Leon to notice and disengage from maladaptive thoughts and behaviors using CBT-based strategies. Therapist will incorporate parent training strategies as appropriate. Therapist will engage Kristen Leon in a discussion of various social situations, including consideration of how to respond in a way that takes her toward her goals, and incorporating additional resources (including in-vivo role plays) as appropriate Therapist will help Kristen Leon to identify opportunities for increased social connection in her community. Therapist will provide referrals for additional resources as appropriate        Kristen [name], PhD               Kristen [name], PhD

## 2023-04-29 ENCOUNTER — Other Ambulatory Visit: Payer: Self-pay | Admitting: Psychiatry

## 2023-04-29 DIAGNOSIS — F3177 Bipolar disorder, in partial remission, most recent episode mixed: Secondary | ICD-10-CM

## 2023-05-07 ENCOUNTER — Other Ambulatory Visit: Payer: Self-pay | Admitting: Psychiatry

## 2023-05-07 DIAGNOSIS — Z79899 Other long term (current) drug therapy: Secondary | ICD-10-CM

## 2023-05-07 DIAGNOSIS — F3162 Bipolar disorder, current episode mixed, moderate: Secondary | ICD-10-CM

## 2023-05-22 ENCOUNTER — Encounter: Payer: Self-pay | Admitting: Psychiatry

## 2023-05-22 ENCOUNTER — Telehealth (INDEPENDENT_AMBULATORY_CARE_PROVIDER_SITE_OTHER): Payer: Federal, State, Local not specified - PPO | Admitting: Psychiatry

## 2023-05-22 DIAGNOSIS — Z79899 Other long term (current) drug therapy: Secondary | ICD-10-CM

## 2023-05-22 DIAGNOSIS — Z91148 Patient's other noncompliance with medication regimen for other reason: Secondary | ICD-10-CM

## 2023-05-22 DIAGNOSIS — F4000 Agoraphobia, unspecified: Secondary | ICD-10-CM

## 2023-05-22 DIAGNOSIS — F411 Generalized anxiety disorder: Secondary | ICD-10-CM | POA: Diagnosis not present

## 2023-05-22 DIAGNOSIS — F3177 Bipolar disorder, in partial remission, most recent episode mixed: Secondary | ICD-10-CM | POA: Diagnosis not present

## 2023-05-22 DIAGNOSIS — R7989 Other specified abnormal findings of blood chemistry: Secondary | ICD-10-CM

## 2023-05-22 DIAGNOSIS — F422 Mixed obsessional thoughts and acts: Secondary | ICD-10-CM

## 2023-05-22 DIAGNOSIS — G3184 Mild cognitive impairment, so stated: Secondary | ICD-10-CM

## 2023-05-22 DIAGNOSIS — F5105 Insomnia due to other mental disorder: Secondary | ICD-10-CM

## 2023-05-22 DIAGNOSIS — F3162 Bipolar disorder, current episode mixed, moderate: Secondary | ICD-10-CM

## 2023-05-22 MED ORDER — QUETIAPINE FUMARATE 50 MG PO TABS: 50.0000 mg | ORAL_TABLET | Freq: Every day | ORAL | 0 refills | Status: DC

## 2023-05-22 MED ORDER — CLONIDINE HCL 0.1 MG PO TABS: ORAL_TABLET | ORAL | 1 refills | Status: DC

## 2023-05-22 NOTE — Progress Notes (Signed)
Kristen Leon [id number] 1966/08/05 57 y.o.  Video Visit via My Chart  I connected with pt by My Chart video and verified that I am speaking with the correct person using two identifiers.   I discussed the limitations, risks, security and privacy concerns of performing an evaluation and management service by My Chart  and the availability of in person appointments. I also discussed with the patient that there may be a patient responsible charge related to this service. The patient expressed understanding and agreed to proceed.  I discussed the assessment and treatment plan with the patient. The patient was provided an opportunity to ask questions and all were answered. The patient agreed with the plan and demonstrated an understanding of the instructions.   The patient was advised to call back or seek an in-person evaluation if the symptoms worsen or if the condition fails to improve as anticipated.  I provided 30 minutes of video time during this encounter.  The patient was located at home and the provider was located office. Session started 1130-1200  Subjective:   Patient ID:  Kristen Leon is a 57 y.o. (DOB Apr 28, 1966) female.  Chief Complaint:  Chief Complaint  Patient presents with   Follow-up   Depression   Anxiety     Medication Refill Pertinent negatives include no chest pain or weakness.  Anxiety Symptoms include decreased concentration. Patient reports no chest pain, confusion, dizziness, nervous/anxious behavior, palpitations or suicidal ideas.    Depression        Associated symptoms include decreased concentration.  Associated symptoms include no suicidal ideas.  Past medical history includes anxiety.      Kristen Leon presents  today for follow-up ofBipolar disorder, generalized anxiety disorder, nocturnal panic attacks, history of OCD, and mild cognitive impairment and worsening driving anxiety.  She has a goal of getting off the Seroquel in hopes of improving  memory.  at visit August 06, 2019.  We needed to start a mood stabilizer and discussed variety of options and decided to start with Latuda.  We started at 20 mg and the plan was to increase gradually to 80 mg daily.  If that was successful this follow-up was going to lead to a potential reduction in an attempt to wean Seroquel. Never got up to 80 mg Latuda.    Did not noticed much of a difference.  visit September 03, 2019.  The following changes were made: Increase vitamin D from 5000 units daily to 10K units daily Start Latuda  80 mg daily Start reduction in Seroquel next week and reduce once every 3 weeks. Reduced Seroquel from 600 to 200 mg HS.  At first had sleep and mood problems and angry.  Added in  gabapentin 300 mg total week ago and sleep better.  Able to sleep on her own.  Mood is better also.  visit October 12, 2019.  The following changes were made: Increase vitamin D from 5000 units daily to 10K units daily Continue Latuda  80 mg daily Continue gradual reduction in Seroquel next week and reduce once every 3 to 4 weeks by 50 mg. Trying to get rid of gabapentin and Seroquel for cognitive reasons primarily  seen December 16, 2019.  The following was noted: Down to Seroquel 150 HS for 3 weeks.  Quality of sleepy slightly off.  How long on this dose?  Tends to wake 30 min earlier than usual.  Bought magnesium.   Feels fine in the day.  Slightly less harried and mentally  cloudy with reduction in Seroquel. Likes the [place] as mood medication.   Overall mood and anxiety are under control.  Other concern is focus and memory. Wants to get off gabapentin and Seroquel for that reason.  Feels the meds may increase risk of dementia.  Overall she has seen some cognitive improvement since the reduction in Seroquel as noted. She was in courage to try reducing the Seroquel further gradually.  She was encouraged to leave the gabapentin at 300 mg daily because it was helpful.  seen March 14, 2020.   She was not doing well.  The following was noted: Couldn't sleep without Seroquel 200 mg.  Felt unstable and anxious.  For weeks felt she still struggled at 200 mg Seroquel and raised the dose all the way back up to where she started bc felt desperate and was on edge.   Early 2000's not on Seroquel and had a lot of anxiety and obsessive thought.  It helps anxiety. Mood better on Seroquel and feels better physically and mentally.  A little down with stress and failure of switch to Jordan.  little anger but not much depression and no other sx of mania.    Driving anxiety and sense unreality resolved.  Fine with driving now. The following changes were made: Continue vitamin D from 5000 units daily to 10K units daily Wean Latuda to 40 mg for a week and then stop it. Then start Saphris 5mg  HS and reduce Seroquel 400 mg at night for 5 days, then increase Saphris 10 mg at night and reduce Seroquel to 200 mg at night for 5 days, then increase Saphris to 15 HS and stop sEroquel.  04/12/2020 appointment the following is noted: As noted pt has been wanting off Seroquel so med changes last visit wre intended to help. More anxious with reduced gabapentin and Seroquel.  Hard to stop Seroquel bc both anxiety and insomnia and using 100 mg Seroquel.  Missed a day of work over it yesterday.  More sleep with Saphris than Latuda.  No SE with saphris. Anxiety included fidgety. Hasn't tried Saphris 15 mg daily yet.   Has felt in a little less foggy headed with reduced Seroquel and gabapentin.  Down on gabapentin for a while now and only a little change with that reduction.  A little better focus and memory now. Neuropsych testing next week. Plan: Increase Saphris 15 mg HS and stop Seroquel again.  Can reduce Seroquel to 50 or even lower if needed.    05/09/2020 phone call with the following noted: Patient had to go back up to 600 mg Seroquel at hs, she has tried the last few nights at 500 mg. She's been unsuccessful  tapering down and just wanted to have 200 mg and 100 mg tablets available to try and get it lowered.   07/07/2020 appointment with the following noted: Got close to off Seroquel but felt untethered and trouble with sleep initial and terminal. 5-6 hours on low dose Seroquel. Never tried Saphris 15 mg for reasons that are unclear.  Stopped shortly after the phone session from last time. Started Seroquel by 2002 and gabapentin in 2003.  Finds it very difficult to get off the meds and worry over instability. Plan: Start Saphris 10 mg at night and reduce Seroquel to 400 mg nightly for 4 nights, Then increase Saphris to 15 mg at night and reduce Seroquel to 200 mg for 4 nights, Then reduce Seroquel to 100 mg and continue Saphris at 15 mg  nightly for 4 nights, Then stop Seroquel and if insomnia occurs, increase Saphris to 20 mg at night.  07/19/2020 phone call.  Restless legs on Saphris.  Given instructions to change to loxapine 100 mg nightly. 07/25/2020 phone call patient stating she wanted to try to continue Saphris but have treatment for the restless legs side effects. Prescription sent for ropinirole  09/13/2020 appointment with the following noted: Not taking Saphris.  At 10 mg HS CO jitteriness and anxiety the next day. Never took loxapine. Wonders about taking loxapine now.  Plan: Give her this change in instructions for transition from Seroquel to loxapine: Start loxapine 25 mg capsule 1 at night and reduce Seroquel to 400 mg nightly for 4 nights, Then increase loxapine to 2 capsules at night and reduce Seroquel to 200 mg for 4 nights, Then reduce Seroquel to 100 mg and increase loxapine to 3 capsules nightly for 4 nights, Then stop Seroquel and if insomnia occurs, increase loxapine to 4 capsules at night and notify MD.   Multiple phone calls since the last visit including the following. 10/24/2020:Telephone call to patient.  She experiences an increase in anxiety whenever she reduces the  Seroquel.  She is just started the transition from Seroquel to loxapine.  She is tolerating the loxapine well.  Encouraged her to just use the lorazepam and transition from Seroquel to loxapine.  It is hoped and expected that the loxapine will effectively manage anxiety as the Seroquel when she is on the full dose.  She agrees to the plan.  Lorazepam prescription sent in. 11/17/2020: RTC  See prior note.  Took 50 mg Seroquel last night.  Realized she made a mistake in dosing.  She stopped from 200 mg Seroquel instead of reducing to 100 mg as instructed.  Had gotten up to 4 loxapine without seroquel was jittery and like she'd come out of her skin and took lorazepam.  Still some problems with memory.  Last night took 50 mg Seroquel and was a little better. Much better today than yesterday.   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   Memory testing with visuospatial problems and was worked up over that.   Take 4 loxapine and start 75 mg Seroquel and taper from there more slowly. Appt 11/22/20  11/22/2020:Pt requesting Rx for Loxapine 25 mg 4/d  30 day and Seroquel 200 mg 3/d   30 day and Lorazepam 0.5 mg 1-2 tab every 8 hrs PRN for anxiety  @ [facility] [employer] on file. Apt RS from 12/21 to 2/9 and on canc list. Pt going out of town 12/27-1/3 asking for Rx before 12/27.  11/22/2020 MD response: I sent in the prescription for lorazepam.  Patient is not on quetiapine 200 mg 3 daily and I will not prescribe that dose.  Ask her what dose of quetiapine she is actually taking.  Also the insurance will not cover 4 loxapine capsules daily.  If she is taking 4 I will need to change it to 2 of the 50 mg capsules.  Please verify her current dose of loxapine and quetiapine.  I am weaning her off of quetiapine she should not be on that high of a dose of quetiapine. 11/23/2020:Pt called back stating she would like to stop Loxapine completely. She is having blurred vision  and jittery. She did not pick up Loxapine Rx. She is currently taking quetiapine 100 mg (4-25 mg tabs daily). Requesting to go back to  600 mg daily. Requesting Rx for this.  Agreed 12/01/2020:Kristen Leon called to request that you decrease her gabapentin and prescribe Buspar.  Please call to discuss this.  appt 01/11/21.  MD response: Took buspar years ago and wants to try it again and reduce gabapentin.  Currently on 1200 mg HS.  Reduce to 900 mg gabapentin.   Start buspirone 5 mg twice daily, and increase to 15 mg BID. Disc SE.  She agrees.  01/11/2021 appointment with the following noted: Completely off gabapentin for a couple of weeks maybe.  Initially anxiety a little high in AM.  Increased buspirone. Had increase anxiety when off Seroquel.  Was off about 4 days and memory was not better.  Anxiety is better now. Anxiety is a little higher off gabapentin but not unmanageable.  Sleep is not as good off the gabapentin.  More initial insomnia and takes melatonin to manage. Not marked difference with cognition off gabapentin. Read about antipsychotics reducing brain volume and still wants to stop Seroquel despite multiple failed attempts. Wants to switch from Seroquel to trazodone.  Plan: OK trial Seroquel reduction to 400 mg daily with no other med changes.  02/17/2021 appointment with the following noted: Stopped buspirone without change. Stopped gabapentin. Reduced Seroquel to 400 mg HS. She is convinced antipsychotic is causing brain loss.  She wants to be off antipsychotics bc of this concern and risk of dementia.  Don't think fears of dementia are unfounded. Doesn't believe she has psychotic sx. Wants to retry lithium and then trazodone in place of Seroquel.  Took lithium a long time.   Acknowledges prior failures weaning Seroquel multiple times but thinks maybe it was too fast.  Sleep most of the night and sleep less deep than 600 mg Seroquel.  Also less deep without gabapentin.  Altogether 6-7  hours but leaves the TV on.   In bed 8 watching TV and doesn't try to sleep until 1030. Plan: She  failed attempts to get off Seroquel multiple times. OK trial Seroquel reduction to 300 mg daily for 3-4 weeks, then 200 mg for 3-4 weeks. Disc withdrawal effects from Seroquel. Start lithium 300 mg nightly for a week then 600 mg nightly. Wait a week and get blood level.    04/12/2021 appointment with the following noted: Did get down to Seroquel 200 mg HS and up to lithium 600 HS and trazodone 100 mg HS. Sleep more interrupted but 6-7 hours with poor quality and less well rested with change.  Mood really good with lithium and maybe better. Often had rapid cycling and it seemed to stop. But "sleep sucks" and feels it affects her during the day Still takes mirtazapine 15 mg and added trazodone.  Sleep worse without trazodone. No SE with lithium. Cognition  more clear with better focus with less Seroquel.   Patient reports stable mood and denies depressed or irritable moods.  Patient denies any recent difficulty with anxiety, surprisingly. Denies appetite disturbance.  Patient reports that energy and motivation have been good.  Patient denies any difficulty with concentration.  Patient denies any suicidal ideation.  Plan: Continue Seroquel  200 mg for 3-4 weeks. Continue lithium 600 mg nightly. Check another level  Stop mirtazapine and trazodone Doxepin 10-30 mg HS   04/24/2021 phone call complaining of obsessive thinking and wanting medicine sent into the pharmacy. MD response: Her case is very complicated because she has complained of side effects from atypicals and worried about cognitive side effects of benzodiazepines.  She is  bipolar and therefore we cannot use SSRIs.  She does not want to take atypical antipsychotics.  She is on a low dose of Seroquel now and I am sure does not want to increase the dosage back up.  She has a as needed prescription for lorazepam she can use that.  I will not  make any other major med changes over the phone outside of a scheduled appointment.  If the symptoms are bothersome enough however going to the cancellation list and we will attempt to get her into an appointment sooner.  06/01/2021 appointment with the following noted: Thinks she stopped trazodone and ? Took doxepin.  She vaguely think she tried it and it didn't help sleep. Mood more stable on lithium 600 HS.  Prior mood cycling into dep and mania have resolved on it.. Still problems with her sleep. Increased Seroquel to 400 mg on her own for sleep added gabapentin 600 and sleep is better but wants off both of them. Still on mirtazapine 30.   Cholecystectomy scheduled for August. Had stopped Seroquel and then had intrusive thoughts which have now resolved.  At the time she called they were bad. Plan: Continue lithium 600 mg nightly. Check another level before next appt Wants to try again to taper quetiapine, will proceed as follows: Stop mirtazapine DT NR Retry Doxepin at higher dose of 50 mg up to 100 mg HS Once sleeping well then try to taper Seroquel very slowly such as reduce by 50 mg every 2 to 3 weeks or even slower if necessary.  09/05/2021 appointment with the following noted: Got down to Seroquel 200 and taking gabapentin 600 mg HS. Never tried 50-100 mg for sleep. Off track for 6 weeks without enough sleep.  Cholecystectomy recently. On phone with BF too much at night talking to him bc of his work schedule.   More irritable and tired.   Older cousin moved in for 3 mos and stressed by that and esp bc he brought a dog.  He's taking advantage of her and brought his GF. Stopped lithium AMA bc "I was afraid of it".   I want to go back on lithium bc it helped and retry doxepin at higher dose and try wean Seroquel.  Then wean gabapenin.   Was more focused and better mood stability on lithium. Mirtazapine does not help sleep much. Paranoid about being hurt if she dates and no history of  it.  Develop a whole story about what might happen. Plan: Stop mirtazapine DT NR Retry Doxepin at higher dose of 50 mg up to 100 mg HS Once sleeping well then try to taper Seroquel very slowly such as reduce by 50 mg every 2 to 3 weeks or even slower if necessary. Restart lithium 600 mg nightly. Check another level before next appt Don't stop meds AMA.   Call if there's a problem.  11/16/21 appt noted: Second week of Seroquel 150 mg daily. Is sleeping currently but not the same quality but not terrible.  On doxepin 75 mg HS Mood is good.  Likes the lithium better than Seroquel for mood.  Fewer hypomanic episones and more stable with less anger. Never got lithium level.  Says she'll do so. Rare lorazepam. No SE right now. Asks about Ozempic. Plan: continue Doxepin at higher dose of 50 mg up to 100 mg HS Once sleeping well then try to taper Seroquel very slowly such as reduce by 50 mg every 2 to 3 weeks or even slower if  necessary. Continue lithium 600 mg nightly. Get lithium level ASAP.  Then repeat lithium level with every 20 # of weight loss Don't stop meds AMA.   Call if there's a problem.  01/18/2022 appointment with the following noted: Ran out of doxepin and says refill rejected for 3 weeks. Was feeling kind of down. Down to Seroquel 25 mg HS for 2 + weeks. Still sleeping with some awakening.  Surprising good sleep.  6 and 1/2 hours of sleep and not drowsy daytime. Mood is fine.   Not sure if doxepin helped sleep but would like to have it. Patient reports stable mood and denies depressed or irritable moods.  Patient denies any recent difficulty with anxiety.  Patient denies difficulty with sleep initiation or maintenance. Denies appetite disturbance.  Patient reports that energy and motivation have been good.  Patient denies any difficulty with concentration.  Patient denies any suicidal ideation. Plan no med chages  04/17/22 appt noted: Takes gabapentin prn sleep and it  helps. Felt dull on lithium and U frequency so reduced it to 1of 300 mg  lithium daily. Down to Seroquel 25 mg HS and Sonata on occasion. Feels less dull with less lithium.  Thinks she was more depressed on lihtium at 600 mg daily.  Feels better the last 5 days.  Less urinary frequency. Sleep is less deep with less Seroquel and shorter duration 6 hours.  It's not terrrible but not great. 100 times better cognitively with less Seroquel.   Noticed it at work. Anxiety in check and she's surprised. Doesn't want med changes today. Plan; Cogniton better with less serotquel 25 mg HS. She wants to try lorazepam 0.5 mg HS in place of Sonata to see if she can sleep longer. Continue lithium 600 mg nightly was recommended but she dropped it to 300 mg nightly because she complained of dullness at the 600 mg and perhaps even some depression.  We agreed on a compromise of 450 mg nightly.  Even that will be below the usual therapeutic range and there is significant risk of mood instability and relapse with this and she accepts that risk.   07/04/22 appt noted: Using lorazepam about every 3 mos. Taking Sonata more about 1/2 the time. Taking quetiapine 25 mg nightly and lithium 450 mg daily. Don't think meds working out for her.  Not taking lithium enough.  More hypomania and sleep not enough nor restorative. Hypomania includes talking to herself a lot and tell funny stories to herself. Excitable mood and speech. More energy and need to burn it off.  Therapist noticed.   EMA.  About 5 hours sleep since off more Seroquel. On more lithium had less hypomania but felt flat on more lithium.  Still frequent urination.   Saw a urologist. Ativan not helpful sleep Plan: Cogniton complaints resolved with less serotquel 25 mg HS. But not sleeping.   Option switch to Thorazine 25 mg for less hangover.  She will call in a couple of weeks and if sleep is not better we will switch to the Thorazine.  She understands this is not an  antipsychotic at these low dosages but it can have other side effects like dizziness or drowsiness. Increase lithium to 450/600 mg QOD bc hypomania at 450 daily and dullness at 600 mg daily. Check lithium level in 2 weks.   09/07/22 NS  10/19/2022 appointment noted: Chlorpromazine made her feel wired and only taken once or twice and stopped. Still trouble with sleep unless takes more Seroquel.  Seroquel helps her go to sleep but not stay asleep. Thinks lithium is dulling and more periods of hypomania.  Wants to stay on lithium bc doesn't want to take antipsychotic for mood stabilizer.  Don't feel as connected to people on it bc feels flat.  Hard to feel emotional about things.  On Seroquel felt like personality was more normal.  Cognition definitely better off the Seroquel. Sonata about once every 3 weeks.    Avg 5 hours of sleep nightly without napping.  Not drowsy but is tired during the day.  Can't sleep withoout meds. Has been having a lot of hypomania but no one else is complaining.  More than I usually have and can notice it at work.  More chatty and more inclined to be outspoken and borderline inappopriate.  No anger problems.   Not depressed much ever.   Gall bladder removed last year and GI Sx ongoing with ongoing workup.  GI problems predate lithium she thinks.  02/19/23 appt noted:  Switch lithium in AM 2 weeks ago and less compliant.  Mood less stable and more irritable.  CO polyuria and nocturia. Sleep impacted by not usually taking quetiapine.  Sleep irregular 5-6 hours.  Recognizes she can be rude to her kids.. I'm not open to an antipsychotic.   Only taking lithium about 300 mg 3 days in last week. Taking ativan helped her feel much better.  She wants to take one of the 0.5 mg in the AM to calm her irritability. Plan discussed off-label clonidine for bipolar irritability and potential antimanic effect.  We did this given her reluctance to take alternatives and failure of multiple  options noted below  05/22/23 appt noted: Meds: clonidine 0.1 mg BID, Namenda , lithium 150 mg daily, Seroquel 25 mg HS and occ extra. Sonata prn for sleep. Seroquel helps her fall asleep.  Recently hypomania is different with clonidine.  Seeing it more often and lasts longer and wonders about taking more lithium when it happens.  Not happened in a while. First noted with subtherapeutic dose lithium when coming off of it.  Saying things she wouldn't normally say.  Clonidine helped but didn't stop it from happening.  Hypomania can last up to a week but feels more normal now.   When took steroid was hypomanic.  Still on NAC, levoxyl, vit D.  Taking atenolol also. Not particularly dep.  Anxiety manageable.  Not hypomanic now.  Feels more unrestrained in conversations and does concern her.  She feels her brain is clearer off the mood stabilizers.  Better in conversation.  Feels better emotionally bc feels more emotionally available to people than before.. Not currently irritable with clonidine, it helps a good bit.   Monitors BP and it is normal.  Saw card who said is ok to take clonidine.  Normal pulse.   Likes the clonidine as an off label mood stabilizer with benefit for hypomania and resolved irritablity.  When manic feels more reckless, spending, talk to herself, driven, reduced sleep, irritable and angry.  Past Psychiatric Medication Trials: Depakote NR, carbamazepine questionable side effects, lamotrigine lost response, Trileptal,   lithium , Vraylar 3 SE, Abilify, brief Latuda ? effect,  clozapine, olanzapine , Seroquel 600, risperidone 1 mg twice daily  , Saphris CO anxiety  Gabapentin cog SE,  Trazodone 200, doxepin 30 not sedating, mirtazapine,  Thorazine 25 mg NR and activated Lorazepam 1 mg poor response for sleep  several SSRIs, clomipramine had vision side effects,   failed  an attempt to switch from Seroquel to Vraylar early 2020  Failed switch from Seroquel to [place],  Saphris, Vraylar and loxapine No psych hosp.  Review of Systems:  Review of Systems  Cardiovascular:  Negative for chest pain and palpitations.  Gastrointestinal:  Negative for diarrhea.  Genitourinary:  Positive for frequency.  Neurological:  Negative for dizziness, tremors and weakness.  Psychiatric/Behavioral:  Positive for agitation, decreased concentration, dysphoric mood and sleep disturbance. Negative for behavioral problems, confusion, hallucinations, self-injury and suicidal ideas. The patient is not nervous/anxious and is not hyperactive.     Medications: I have reviewed the patient's current medications.  Current Outpatient Medications  Medication Sig Dispense Refill   Acetylcysteine (NAC) 600 MG CAPS Take 1 capsule (600 mg total) by mouth daily. 90 capsule 1   atenolol (TENORMIN) 50 MG tablet Take 25 mg by mouth at bedtime.     B Complex Vitamins (VITAMIN B-COMPLEX) TABS Take 1 tablet by mouth at bedtime.     Calcium Carbonate-Vitamin D (CALCIUM-D PO) Take by mouth at bedtime.     cefadroxil (DURICEF) 1 g tablet Take 1 tablet (1 g total) by mouth 2 (two) times daily. 14 tablet 0   Cholecalciferol (VITAMIN D3) 5000 units CAPS Take 1 capsule by mouth at bedtime.     ESTRING 7.5 MCG/24HR vaginal ring      fluticasone (FLONASE) 50 MCG/ACT nasal spray Place into the nose.     folic acid (FOLVITE) 400 MCG tablet Take 400 mcg by mouth at bedtime.     levothyroxine (SYNTHROID, LEVOTHROID) 112 MCG tablet Take 112 mcg by mouth at bedtime.     linaclotide (LINZESS) 290 MCG CAPS capsule Take 290 mcg by mouth at bedtime.     lithium carbonate 150 MG capsule Take 1 capsule (150 mg total) by mouth daily. 90 capsule 0   lithium carbonate 150 MG capsule Take 1 capsule (150 mg total) by mouth daily. 90 capsule 1   lithium carbonate 300 MG capsule TAKE 2 CAPSULES DAILY 180 capsule 0   LORazepam (ATIVAN) 0.5 MG tablet Take 1-2 tablets (0.5-1 mg total) by mouth every 8 (eight) hours as needed  for anxiety. 180 tablet 0   MAGNESIUM PO Take 400 mg by mouth at bedtime.     memantine (NAMENDA) 10 MG tablet TAKE 1 TABLET TWICE A DAY 180 tablet 0   tretinoin (RETIN-A) 0.025 % cream Apply topically at bedtime. face     zaleplon (SONATA) 10 MG capsule TAKE 1 CAPSULE AT BEDTIME  AS NEEDED FOR SLEEP 90 capsule 0   chlorproMAZINE (THORAZINE) 25 MG tablet Take 1 tablet (25 mg total) by mouth at bedtime. (Patient not taking: Reported on 05/22/2023) 30 tablet 0   cloNIDine (CATAPRES) 0.1 MG tablet 1 in the AM and 2 at night 270 tablet 1   metroNIDAZOLE (FLAGYL) 500 MG tablet Take 1 tablet (500 mg total) by mouth 2 (two) times daily. (Patient not taking: Reported on 05/22/2023) 14 tablet 0   QUEtiapine (SEROQUEL) 50 MG tablet Take 1 tablet (50 mg total) by mouth at bedtime. 90 tablet 0   temazepam (RESTORIL) 15 MG capsule  (Patient not taking: Reported on 05/22/2023)     No current facility-administered medications for this visit.    Medication Side Effects: None  Allergies: No Known Allergies  Past Medical History:  Diagnosis Date   Anxiety    Bipolar 1 disorder (HCC)    followed by dr c. cottle   Carpal tunnel syndrome on both sides  CKD (chronic kidney disease), stage II    followed by pcp   Endometriosis    Hypothyroidism, postablative    endocrinologist--- dr c. Dewitt Hoes--- dx toxic multinodular thyroid 2000 w/ hyperthyroidism s/p RAI  2000, 2002, and 2004;  post hypothryoid in 2008;   last bx 2012 left side, benign per pt   Irritable bowel syndrome with constipation    followed by dr w. Jason Fila ([facility])   MDD (major depressive disorder)    Multinodular thyroid    in 2000 dx toxic post ablative   NAFLD (nonalcoholic fatty liver disease)    OSA (obstructive sleep apnea)    05-18-2021  per pt study done approx. 2018, told moderat OSA,  does uses cpap   Tachycardia    cardiologist--- Guerry Bruin NP @ [facility] in [place],  w/ palpitations, controlled w/ atenolol;  pt has had work-up per note echo  2014 normal ,  event monitor 2018 showedST w/ mild ST no arrhythmia,  ETT 02/ 2022 normal no ischemia, ef 67%   Wears contact lenses     Family History  Problem Relation Age of Onset   Thyroid disease Mother    Diabetes Mother    Hypertension Mother     Social History   Socioeconomic History   Marital status: Divorced    Spouse name: Not on file   Number of children: Not on file   Years of education: Not on file   Highest education level: Not on file  Occupational History   Not on file  Tobacco Use   Smoking status: Former    Years: 5    Types: Cigarettes    Quit date: 2000    Years since quitting: 24.4   Smokeless tobacco: Never  Vaping Use   Vaping Use: Never used  Substance and Sexual Activity   Alcohol use: Yes    Alcohol/week: 1.0 standard drink of alcohol    Types: 1 Standard drinks or equivalent per week   Drug use: Never   Sexual activity: Not on file  Other Topics Concern   Not on file  Social History Narrative   Not on file   Social Determinants of Health   Financial Resource Strain: Not on file  Food Insecurity: Not on file  Transportation Needs: Not on file  Physical Activity: Not on file  Stress: Not on file  Social Connections: Not on file  Intimate Partner Violence: Not on file    Past Medical History, Surgical history, Social history, and Family history were reviewed and updated as appropriate.   Please see review of systems for further details on the patient's review from today.   Objective:   Physical Exam:  There were no vitals taken for this visit.  Physical Exam Neurological:     Mental Status: She is alert and oriented to person, place, and time.     Cranial Nerves: No dysarthria.  Psychiatric:        Attention and Perception: She is inattentive.        Mood and Affect: Mood is anxious. Mood is not depressed.        Speech: Speech normal. Speech is not rapid and pressured or slurred.        Behavior: Behavior is cooperative.         Thought Content: Thought content is not paranoid or delusional. Thought content does not include homicidal or suicidal ideation. Thought content does not include suicidal plan.        Cognition and Memory:  She does not exhibit impaired recent memory or impaired remote memory.        Judgment: Judgment normal.     Comments: Insight and judgment appear good. Irritable. No significant pressured speech or flight of ideas     Lab Review:     Component Value Date/Time   NA 136 04/10/2021 1035   K 4.3 04/10/2021 1035   CL 97 04/10/2021 1035   CO2 25 04/10/2021 1035   GLUCOSE 77 04/10/2021 1035   BUN 13 04/10/2021 1035   CREATININE 0.94 04/10/2021 1035   CALCIUM 9.7 04/10/2021 1035       Component Value Date/Time   WBC 6.6 05/24/2021 0630   RBC 3.79 (L) 05/24/2021 0630   HGB 11.3 (L) 05/24/2021 0630   HCT 35.0 (L) 05/24/2021 0630   PLT 325 05/24/2021 0630   MCV 92.3 05/24/2021 0630   MCH 29.8 05/24/2021 0630   MCHC 32.3 05/24/2021 0630   RDW 13.2 05/24/2021 0630    Lithium Lvl  Date Value Ref Range Status  10/10/2022 0.6 0.5 - 1.2 mmol/L Final    Comment:    A concentration of 0.5-0.8 mmol/L is advised for long-term use; concentrations of up to 1.2 mmol/L may be necessary during acute treatment.                                  Detection Limit = 0.1                           <0.1 indicates None Detected   12/05/2021 lithium level 0.6 on 600 mg daily stable  04/10/2021 lithium level 0.8 on 600 mg daily.  No results found for: "PHENYTOIN", "PHENOBARB", "VALPROATE", "CBMZ"   Took lab orders to PCP from visit in September and reports results: B12 >2000 Folate > 20 D 43.5 Iron TIBC 297 (250-450)  UIBC 156 (131-425)  Iron 141 (27-159)  Iron saturation 47 (15-55)   .res Assessment: Plan:    Bipolar disorder, in partial remission, most recent episode mixed (HCC) - Plan: cloNIDine (CATAPRES) 0.1 MG tablet  Generalized anxiety disorder  Mixed obsessional thoughts  and acts  Insomnia due to mental condition - Plan: QUEtiapine (SEROQUEL) 50 MG tablet  Lithium use  MCI (mild cognitive impairment)  Low vitamin D level  Agoraphobia  Noncompliance with medication treatment due to difficulty with dosing  Bipolar 1 disorder, mixed, moderate (HCC) - Plan: QUEtiapine (SEROQUEL) 50 MG tablet   30-minute video time with patient was spent . We discussed nature of her psychiatric diagnoses .   She was motivated to get off Seroquel because she believes it caused cognitive SE and fears brain matter loss.  Bipolar disorder Was worse with reduction in Seroquel until addtion of lithium helped and earlier She felt lithium had provided better mood stability than Seroquel. But she complained of dullness on lithium 600 and then became noncompliant and mood sx are worse with irritablity.  She refuses to increase it back to therapeutic, though we disc at length amiloride might remedy the polyuria.    Counseled patient regarding potential benefits, risks, and side effects of lithium to include potential risk of lithium affecting thyroid and renal function.  Discussed need for periodic lab monitoring to determine drug level and to assess for potential adverse effects.  Counseled patient regarding signs and symptoms of lithium toxicity and advised that they notify office immediately  or seek urgent medical attention if experiencing these signs and symptoms.  Patient advised to contact office with any questions or concerns.  Hypomania disc extensively. She is much better with clonidine but not resolved.  She doesn't want to increase the lithium despite some ongoing hypomania.  We discussed alternatives including switching to an antipsychotic type mood stabilizer.  She has been on all the traditional mood stabilizers that are not antipsychotics.   She does not want to try another antipsychotic mood stabilizer at this time though there are options like Caplyta. But she doesn't want  to increase lithium but agrees to ultral low 150-300 mg daily; nor does she agree to antipsychotic.   Disc my recommendation for standard care as above but  The only reasonable option would be off label clonidine for irritability though this is not a traditional mood stabilize r and may fail.  Disc SE. This has been helpful but not perfect. Agrees to increase clonidine to 0.1 mg Am and 2 mg PM.  She wants to continue to use low dose Seroquel 25  and rec increase 50 mg HS until sometihing else works which also helps sleep.  She agrees. Sleep options Belsomra, Dayvigo, doxepin, Rozerem, hydroxyzine.  Prefer avoid BZ and bc cognitive concerns she has. Wants Sonata rarely.  Normal CR and CA in 8/23 at [facility] 12/05/2021 lithium level 0.6 on 600 mg daily stable Option amiloride to manage polyuria but she doesn't want to stay on lithium anyway.  If we could get a higher lithium level it might work.  Don't stop meds AMA.   Call if there's a problem.  Sleep hygiene incl no bed without sleep.  She wants to continue memantine 10 BID off label for cognitive problems.  Because the cognitive problems resolved with reduction in Seroquel and lithium and the onset of hypomania.  Discussed potential metabolic side effects associated with atypical antipsychotics, as well as potential risk for movement side effects. Advised pt to contact office if movement side effects occur.   Thyroid was normal recently. B12 and folate are normal D is OK but goal with her should be 50s-60s Disc reasons in detail including Dr. Charm Barges recommendation. Marland Kitchen  gave neuroprotective literature on lithium. Dr. Arlie Solomons article and gave article before.    Clonidine 0.1 mg increase to 1 in the AM and 2 at night off label for manic irritability and mild hypomania.  Disc SE .    FU 2 mos  Meredith Staggers, MD, DFAPA   Please see After Visit Summary for patient specific instructions.  Future Appointments  Date Time Provider Department  Center  07/16/2023  9:00 AM Dewayne Hatch, Hulda Humphrey, PhD [facility] None  07/29/2023 11:00 AM Dewayne Hatch, Hulda Humphrey, PhD [facility] None  08/12/2023 11:00 AM Dewayne Hatch, Hulda Humphrey, PhD [facility] None  08/27/2023  8:00 AM Dewayne Hatch, Hulda Humphrey, PhD [facility] None  09/10/2023 10:00 AM Chrissie [name], PhD [facility] None  09/24/2023  8:00 AM Chrissie [name], PhD [facility] None  10/08/2023  8:00 AM Chrissie [name], PhD [facility] None  10/22/2023  8:00 AM Dewayne Hatch, Hulda Humphrey, PhD [facility] None  11/05/2023  8:00 AM Dewayne Hatch, Hulda Humphrey, PhD [facility] None  11/19/2023  8:00 AM Mendelson, Hulda Humphrey, PhD [facility] None      No orders of the defined types were placed in this encounter.      -------------------------------

## 2023-05-26 ENCOUNTER — Other Ambulatory Visit: Payer: Self-pay | Admitting: Psychiatry

## 2023-05-26 DIAGNOSIS — G3184 Mild cognitive impairment, so stated: Secondary | ICD-10-CM

## 2023-07-10 ENCOUNTER — Ambulatory Visit: Payer: Federal, State, Local not specified - PPO | Admitting: Clinical

## 2023-07-12 ENCOUNTER — Ambulatory Visit: Payer: Federal, State, Local not specified - PPO | Admitting: Clinical

## 2023-07-12 DIAGNOSIS — F3177 Bipolar disorder, in partial remission, most recent episode mixed: Secondary | ICD-10-CM

## 2023-07-12 NOTE — Progress Notes (Signed)
Time: 12:00 pm-12:58 pm CPT code: [id number]-29 Diagnosis Code: Bipolar Disorder, in partial Remission  Kristen Leon was seen remotely using secure video conferencing. She was in her home and therapist was in her office at the time of the appointment. Client is aware of risks of telehealth and consented to a virtual visit. Session focused on considering ways Kristen Leon can expand her social circle, including in terms of friendship and dating. She created a plan to text several friends during an upcoming hair appointment. Therapist suggested strategies to help her increase the numbers of people she has opportunity to meet, such as by thinking of activities she wants to do and inviting others. She is scheduled to be seen again in three weeks.  Treatment Plan Client Abilities/Strengths  Kristen Leon is honest and able to articulate her feelings and experiences. She has a history of consistently attending therapy appointment. She described herself as willing to engage in therapy homework. She has found that she benefits from being reminded of her goals, as she sometimes has a tendency to lose focus on her goals over time.   Client Treatment Preferences:  Kristen Leon prefers Monday and Tuesday appointments-this is best for her work schedule. Time availability varies day to day. She does not have a preference between virtual and in person. Client Statement of Needs Kristen Leon shared that she is seeking encouragement in therapy, as well as having thoughts challenged and being presented with alternate perspectives to consider. She shared that she has also benefited from advice to manage various situations in her life. She is also seeking techniques to build social connections with others.  Treatment Level   She would like biweekly appointments. Symptoms Kristen Leon tends to prioritize others and delay self-care. She reported struggling in several social situations. She also reported that she sometimes focuses on her ex-husband's life more  than she would like.   Problems Addressed  Goals  Objective 1. Kristen Leon would like to learn to take better care of herself and prioritize her emotional needs.    Target Date: 09/26/2023 Frequency: Biweekly  Progress: 0 Modality: Individual therapy  2. Kristen Leon shared that she would like to be a more present and capable parent, and improve her parents skills  Target Date: 09/26/2023 Progress: 0 Frequency: Biweekly Modality: Individual Therapy 3. Kristen Leon would like to expand her social network and create social connections and friendships  Target Date: 09/26/2023 Progress: 0 Frequency: Biweekly Modality: Individual Therapy  Related Interventions Therapist will provide opportunities to process experiences in session. Therapist will help Kristen Leon to notice and disengage from maladaptive thoughts and behaviors using CBT-based strategies. Therapist will incorporate parent training strategies as appropriate. Therapist will engage Kristen Leon in a discussion of various social situations, including consideration of how to respond in a way that takes her toward her goals, and incorporating additional resources (including in-vivo role plays) as appropriate Therapist will help Kristen Leon to identify opportunities for increased social connection in her community. Therapist will provide referrals for additional resources as appropriate   Kristen [name], PhD               Kristen [name], PhD

## 2023-07-16 ENCOUNTER — Ambulatory Visit: Payer: Federal, State, Local not specified - PPO | Admitting: Clinical

## 2023-07-29 ENCOUNTER — Ambulatory Visit: Payer: Federal, State, Local not specified - PPO | Admitting: Clinical

## 2023-07-30 ENCOUNTER — Ambulatory Visit (INDEPENDENT_AMBULATORY_CARE_PROVIDER_SITE_OTHER): Payer: Federal, State, Local not specified - PPO | Admitting: Clinical

## 2023-07-30 DIAGNOSIS — F3177 Bipolar disorder, in partial remission, most recent episode mixed: Secondary | ICD-10-CM | POA: Diagnosis not present

## 2023-07-30 NOTE — Progress Notes (Signed)
Time: 10:00 am-10:58 am CPT code: 98119J-47 Diagnosis Code: Bipolar Disorder, in partial Remission  Kristen Leon was seen remotely using secure video conferencing. She was in her home and therapist was in her office at the time of the appointment. Client is aware of risks of telehealth and consented to a virtual visit. Kristen Leon reflected upon difficulty accomplishing personal goals for self-care. Upon further processing, it became clear that this relates to grief related to her niece's untimely passing that arises when she has the opportunity for down time. Therapist offered an opportunity to process these feelings, and encouraged her to consider allowing her grief as a form of self-care for the time being. Therapist suggested several strategies she can use to process grief, such as journaling, writing letters or talking to the person she has lost, and dedicating activities to her. She is scheduled to be seen again in two weeks.    Treatment Plan Client Abilities/Strengths  Kristen Leon is honest and able to articulate her feelings and experiences. She has a history of consistently attending therapy appointment. She described herself as willing to engage in therapy homework. She has found that she benefits from being reminded of her goals, as she sometimes has a tendency to lose focus on her goals over time.   Client Treatment Preferences:  Kristen Leon prefers Monday and Tuesday appointments-this is best for her work schedule. Time availability varies day to day. She does not have a preference between virtual and in person. Client Statement of Needs Kristen Leon shared that she is seeking encouragement in therapy, as well as having thoughts challenged and being presented with alternate perspectives to consider. She shared that she has also benefited from advice to manage various situations in her life. She is also seeking techniques to build social connections with others.  Treatment Level   She would like biweekly  appointments. Symptoms Kristen Leon tends to prioritize others and delay self-care. She reported struggling in several social situations. She also reported that she sometimes focuses on her ex-husband's life more than she would like.   Problems Addressed  Goals  Objective 1. Kristen Leon would like to learn to take better care of herself and prioritize her emotional needs.    Target Date: 09/26/2023 Frequency: Biweekly  Progress: 0 Modality: Individual therapy  2. Kristen Leon shared that she would like to be a more present and capable parent, and improve her parents skills  Target Date: 09/26/2023 Progress: 0 Frequency: Biweekly Modality: Individual Therapy 3. Kristen Leon would like to expand her social network and create social connections and friendships  Target Date: 09/26/2023 Progress: 0 Frequency: Biweekly Modality: Individual Therapy  Related Interventions Therapist will provide opportunities to process experiences in session. Therapist will help Kristen Leon to notice and disengage from maladaptive thoughts and behaviors using CBT-based strategies. Therapist will incorporate parent training strategies as appropriate. Therapist will engage Kristen Leon in a discussion of various social situations, including consideration of how to respond in a way that takes her toward her goals, and incorporating additional resources (including in-vivo role plays) as appropriate Therapist will help Kristen Leon to identify opportunities for increased social connection in her community. Therapist will provide referrals for additional resources as appropriate    Kristen [name], PhD               Kristen [name], PhD

## 2023-08-02 ENCOUNTER — Other Ambulatory Visit: Payer: Self-pay | Admitting: Psychiatry

## 2023-08-02 DIAGNOSIS — F3162 Bipolar disorder, current episode mixed, moderate: Secondary | ICD-10-CM

## 2023-08-02 DIAGNOSIS — F5105 Insomnia due to other mental disorder: Secondary | ICD-10-CM

## 2023-08-05 ENCOUNTER — Other Ambulatory Visit: Payer: Self-pay | Admitting: Psychiatry

## 2023-08-05 DIAGNOSIS — F3162 Bipolar disorder, current episode mixed, moderate: Secondary | ICD-10-CM

## 2023-08-05 DIAGNOSIS — F5105 Insomnia due to other mental disorder: Secondary | ICD-10-CM

## 2023-08-05 DIAGNOSIS — F422 Mixed obsessional thoughts and acts: Secondary | ICD-10-CM

## 2023-08-05 DIAGNOSIS — G3184 Mild cognitive impairment, so stated: Secondary | ICD-10-CM

## 2023-08-05 DIAGNOSIS — F411 Generalized anxiety disorder: Secondary | ICD-10-CM

## 2023-08-05 DIAGNOSIS — F3177 Bipolar disorder, in partial remission, most recent episode mixed: Secondary | ICD-10-CM

## 2023-08-12 ENCOUNTER — Ambulatory Visit: Payer: Federal, State, Local not specified - PPO | Admitting: Clinical

## 2023-08-13 ENCOUNTER — Ambulatory Visit: Payer: Federal, State, Local not specified - PPO | Admitting: Clinical

## 2023-08-13 DIAGNOSIS — F3177 Bipolar disorder, in partial remission, most recent episode mixed: Secondary | ICD-10-CM | POA: Diagnosis not present

## 2023-08-13 NOTE — Progress Notes (Signed)
Time: 10:00 am-10:58 am CPT code: [id number]-04 Diagnosis Code: Bipolar Disorder, in partial Remission  Carneisha was seen remotely using secure video conferencing. She was in her home and therapist was in her office at the time of the appointment. Client is aware of risks of telehealth and consented to a virtual visit. Hildreth reflected upon having used strategies discussed in session during a recent work trip. She shared that she continues to struggle with grief related to her niece's death, and therapist offered an opportunity to process. She also continues to date and connect with others socially, and therapist worked with her to explore how to continue to increase opportunities for connection with others. She is scheduled to be seen again in two weeks.     Treatment Plan Client Abilities/Strengths  Sirenia is honest and able to articulate her feelings and experiences. She has a history of consistently attending therapy appointment. She described herself as willing to engage in therapy homework. She has found that she benefits from being reminded of her goals, as she sometimes has a tendency to lose focus on her goals over time.   Client Treatment Preferences:  Marggie prefers Monday and Tuesday appointments-this is best for her work schedule. Time availability varies day to day. She does not have a preference between virtual and in person. Client Statement of Needs Anakarina shared that she is seeking encouragement in therapy, as well as having thoughts challenged and being presented with alternate perspectives to consider. She shared that she has also benefited from advice to manage various situations in her life. She is also seeking techniques to build social connections with others.  Treatment Level   She would like biweekly appointments. Symptoms Loneta tends to prioritize others and delay self-care. She reported struggling in several social situations. She also reported that she sometimes focuses on  her ex-husband's life more than she would like.   Problems Addressed  Goals  Objective 1. Arabelle would like to learn to take better care of herself and prioritize her emotional needs.    Target Date: 09/26/2023 Frequency: Biweekly  Progress: 0 Modality: Individual therapy  2. Aubreeana shared that she would like to be a more present and capable parent, and improve her parents skills  Target Date: 09/26/2023 Progress: 0 Frequency: Biweekly Modality: Individual Therapy 3. Vernisha would like to expand her social network and create social connections and friendships  Target Date: 09/26/2023 Progress: 0 Frequency: Biweekly Modality: Individual Therapy  Related Interventions Therapist will provide opportunities to process experiences in session. Therapist will help Meriyah to notice and disengage from maladaptive thoughts and behaviors using CBT-based strategies. Therapist will incorporate parent training strategies as appropriate. Therapist will engage Shanaiya in a discussion of various social situations, including consideration of how to respond in a way that takes her toward her goals, and incorporating additional resources (including in-vivo role plays) as appropriate Therapist will help Liviana to identify opportunities for increased social connection in her community. Therapist will provide referrals for additional resources as appropriate    Chrissie [name], PhD               Chrissie [name], PhD               Chrissie [name], PhD

## 2023-08-22 ENCOUNTER — Telehealth: Payer: Federal, State, Local not specified - PPO | Admitting: Psychiatry

## 2023-08-22 ENCOUNTER — Other Ambulatory Visit: Payer: Self-pay | Admitting: Psychiatry

## 2023-08-22 ENCOUNTER — Encounter: Payer: Self-pay | Admitting: Psychiatry

## 2023-08-22 DIAGNOSIS — F3177 Bipolar disorder, in partial remission, most recent episode mixed: Secondary | ICD-10-CM | POA: Diagnosis not present

## 2023-08-22 DIAGNOSIS — F5105 Insomnia due to other mental disorder: Secondary | ICD-10-CM | POA: Diagnosis not present

## 2023-08-22 DIAGNOSIS — Z79899 Other long term (current) drug therapy: Secondary | ICD-10-CM

## 2023-08-22 DIAGNOSIS — F422 Mixed obsessional thoughts and acts: Secondary | ICD-10-CM

## 2023-08-22 DIAGNOSIS — F4000 Agoraphobia, unspecified: Secondary | ICD-10-CM

## 2023-08-22 DIAGNOSIS — G3184 Mild cognitive impairment, so stated: Secondary | ICD-10-CM

## 2023-08-22 DIAGNOSIS — F411 Generalized anxiety disorder: Secondary | ICD-10-CM | POA: Diagnosis not present

## 2023-08-22 DIAGNOSIS — R7989 Other specified abnormal findings of blood chemistry: Secondary | ICD-10-CM

## 2023-08-22 MED ORDER — CLONIDINE HCL 0.1 MG PO TABS: ORAL_TABLET | ORAL | 1 refills | Status: DC

## 2023-08-22 MED ORDER — MIRTAZAPINE 30 MG PO TABS
30.0000 mg | ORAL_TABLET | Freq: Every day | ORAL | 0 refills | Status: DC
Start: 2023-08-22 — End: 2023-08-23

## 2023-08-22 NOTE — Telephone Encounter (Signed)
Please see message from pharmacy. I know you know what you are doing and the pharmacists do overstep at times, but still wanted to have your feedback.   Pharmacy comment: CLARIFY: Concurrent use of Clonidine and mirtazapine may result in a decreased effectiveness of the Clonidine and increased sedation. Should PT remain on both? Please respond with appropriate changes and comment to Pharmacy. CLARIFY: Concurrent use of Clonidine and mirtazapine may result in a decreased effectiveness of the Clonidine and increased sedation. Should PT remain on both? Please respond with appropriate changes and commen.

## 2023-08-22 NOTE — Progress Notes (Signed)
Kristen Leon [id number] 12-17-1965 57 y.o.  Video Visit via My Chart  I connected with pt by My Chart video and verified that I am speaking with the correct person using two identifiers.   I discussed the limitations, risks, security and privacy concerns of performing an evaluation and management service by My Chart  and the availability of in person appointments. I also discussed with the patient that there may be a patient responsible charge related to this service. The patient expressed understanding and agreed to proceed.  I discussed the assessment and treatment plan with the patient. The patient was provided an opportunity to ask questions and all were answered. The patient agreed with the plan and demonstrated an understanding of the instructions.   The patient was advised to call back or seek an in-person evaluation if the symptoms worsen or if the condition fails to improve as anticipated.  I provided 30 minutes of video time during this encounter.  The patient was located at home and the provider was located office. Session started [id number]  Subjective:   Patient ID:  Kristen Leon is a 57 y.o. (DOB 02-16-1966) female.  Chief Complaint:  Chief Complaint  Patient presents with   Follow-up    Mood, anxiety sleep     Medication Refill Pertinent negatives include no chest pain or weakness.  Anxiety Symptoms include decreased concentration. Patient reports no chest pain, confusion, dizziness, nervous/anxious behavior, palpitations or suicidal ideas.    Depression        Associated symptoms include decreased concentration.  Associated symptoms include no suicidal ideas.  Past medical history includes anxiety.      Kristen Leon presents  today for follow-up ofBipolar disorder, generalized anxiety disorder, nocturnal panic attacks, history of OCD, and mild cognitive impairment and worsening driving anxiety.  She has a goal of getting off the Seroquel in hopes of improving  memory.  at visit August 06, 2019.  We needed to start a mood stabilizer and discussed variety of options and decided to start with Latuda.  We started at 20 mg and the plan was to increase gradually to 80 mg daily.  If that was successful this follow-up was going to lead to a potential reduction in an attempt to wean Seroquel. Never got up to 80 mg Latuda.    Did not noticed much of a difference.  visit September 03, 2019.  The following changes were made: Increase vitamin D from 5000 units daily to 10K units daily Start Latuda  80 mg daily Start reduction in Seroquel next week and reduce once every 3 weeks. Reduced Seroquel from 600 to 200 mg HS.  At first had sleep and mood problems and angry.  Added in  gabapentin 300 mg total week ago and sleep better.  Able to sleep on her own.  Mood is better also.  visit October 12, 2019.  The following changes were made: Increase vitamin D from 5000 units daily to 10K units daily Continue Latuda  80 mg daily Continue gradual reduction in Seroquel next week and reduce once every 3 to 4 weeks by 50 mg. Trying to get rid of gabapentin and Seroquel for cognitive reasons primarily  seen December 16, 2019.  The following was noted: Down to Seroquel 150 HS for 3 weeks.  Quality of sleepy slightly off.  How long on this dose?  Tends to wake 30 min earlier than usual.  Bought magnesium.   Feels fine in the day.  Slightly less harried and mentally  cloudy with reduction in Seroquel. Likes the [place] as mood medication.   Overall mood and anxiety are under control.  Other concern is focus and memory. Wants to get off gabapentin and Seroquel for that reason.  Feels the meds may increase risk of dementia.  Overall she has seen some cognitive improvement since the reduction in Seroquel as noted. She was in courage to try reducing the Seroquel further gradually.  She was encouraged to leave the gabapentin at 300 mg daily because it was helpful.  seen March 14, 2020.   She was not doing well.  The following was noted: Couldn't sleep without Seroquel 200 mg.  Felt unstable and anxious.  For weeks felt she still struggled at 200 mg Seroquel and raised the dose all the way back up to where she started bc felt desperate and was on edge.   Early 2000's not on Seroquel and had a lot of anxiety and obsessive thought.  It helps anxiety. Mood better on Seroquel and feels better physically and mentally.  A little down with stress and failure of switch to [place].  little anger but not much depression and no other sx of mania.    Driving anxiety and sense unreality resolved.  Fine with driving now. The following changes were made: Continue vitamin D from 5000 units daily to 10K units daily Wean Latuda to 40 mg for a week and then stop it. Then start Saphris 5mg  HS and reduce Seroquel 400 mg at night for 5 days, then increase Saphris 10 mg at night and reduce Seroquel to 200 mg at night for 5 days, then increase Saphris to 15 HS and stop sEroquel.  04/12/2020 appointment the following is noted: As noted pt has been wanting off Seroquel so med changes last visit wre intended to help. More anxious with reduced gabapentin and Seroquel.  Hard to stop Seroquel bc both anxiety and insomnia and using 100 mg Seroquel.  Missed a day of work over it yesterday.  More sleep with Saphris than Latuda.  No SE with saphris. Anxiety included fidgety. Hasn't tried Saphris 15 mg daily yet.   Has felt in a little less foggy headed with reduced Seroquel and gabapentin.  Down on gabapentin for a while now and only a little change with that reduction.  A little better focus and memory now. Neuropsych testing next week. Plan: Increase Saphris 15 mg HS and stop Seroquel again.  Can reduce Seroquel to 50 or even lower if needed.    05/09/2020 phone call with the following noted: Patient had to go back up to 600 mg Seroquel at hs, she has tried the last few nights at 500 mg. She's been unsuccessful  tapering down and just wanted to have 200 mg and 100 mg tablets available to try and get it lowered.   07/07/2020 appointment with the following noted: Got close to off Seroquel but felt untethered and trouble with sleep initial and terminal. 5-6 hours on low dose Seroquel. Never tried Saphris 15 mg for reasons that are unclear.  Stopped shortly after the phone session from last time. Started Seroquel by 2002 and gabapentin in 2003.  Finds it very difficult to get off the meds and worry over instability. Plan: Start Saphris 10 mg at night and reduce Seroquel to 400 mg nightly for 4 nights, Then increase Saphris to 15 mg at night and reduce Seroquel to 200 mg for 4 nights, Then reduce Seroquel to 100 mg and continue Saphris at 15 mg  nightly for 4 nights, Then stop Seroquel and if insomnia occurs, increase Saphris to 20 mg at night.  07/19/2020 phone call.  Restless legs on Saphris.  Given instructions to change to loxapine 100 mg nightly. 07/25/2020 phone call patient stating she wanted to try to continue Saphris but have treatment for the restless legs side effects. Prescription sent for ropinirole  09/13/2020 appointment with the following noted: Not taking Saphris.  At 10 mg HS CO jitteriness and anxiety the next day. Never took loxapine. Wonders about taking loxapine now.  Plan: Give her this change in instructions for transition from Seroquel to loxapine: Start loxapine 25 mg capsule 1 at night and reduce Seroquel to 400 mg nightly for 4 nights, Then increase loxapine to 2 capsules at night and reduce Seroquel to 200 mg for 4 nights, Then reduce Seroquel to 100 mg and increase loxapine to 3 capsules nightly for 4 nights, Then stop Seroquel and if insomnia occurs, increase loxapine to 4 capsules at night and notify MD.   Multiple phone calls since the last visit including the following. 10/24/2020:Telephone call to patient.  She experiences an increase in anxiety whenever she reduces the  Seroquel.  She is just started the transition from Seroquel to loxapine.  She is tolerating the loxapine well.  Encouraged her to just use the lorazepam and transition from Seroquel to loxapine.  It is hoped and expected that the loxapine will effectively manage anxiety as the Seroquel when she is on the full dose.  She agrees to the plan.  Lorazepam prescription sent in. 11/17/2020: RTC  See prior note.  Took 50 mg Seroquel last night.  Realized she made a mistake in dosing.  She stopped from 200 mg Seroquel instead of reducing to 100 mg as instructed.  Had gotten up to 4 loxapine without seroquel was jittery and like she'd come out of her skin and took lorazepam.  Still some problems with memory.  Last night took 50 mg Seroquel and was a little better. Much better today than yesterday.   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   Memory testing with visuospatial problems and was worked up over that.   Take 4 loxapine and start 75 mg Seroquel and taper from there more slowly. Appt 11/22/20  11/22/2020:Pt requesting Rx for Loxapine 25 mg 4/d  30 day and Seroquel 200 mg 3/d   30 day and Lorazepam 0.5 mg 1-2 tab every 8 hrs PRN for anxiety  @ [facility] [employer] on file. Apt RS from 12/21 to 2/9 and on canc list. Pt going out of town 12/27-1/3 asking for Rx before 12/27.  11/22/2020 MD response: I sent in the prescription for lorazepam.  Patient is not on quetiapine 200 mg 3 daily and I will not prescribe that dose.  Ask her what dose of quetiapine she is actually taking.  Also the insurance will not cover 4 loxapine capsules daily.  If she is taking 4 I will need to change it to 2 of the 50 mg capsules.  Please verify her current dose of loxapine and quetiapine.  I am weaning her off of quetiapine she should not be on that high of a dose of quetiapine. 11/23/2020:Pt called back stating she would like to stop Loxapine completely. She is having blurred vision  and jittery. She did not pick up Loxapine Rx. She is currently taking quetiapine 100 mg (4-25 mg tabs daily). Requesting to go back to  600 mg daily. Requesting Rx for this.  Agreed 12/01/2020:Karyl called to request that you decrease her gabapentin and prescribe Buspar.  Please call to discuss this.  appt 01/11/21.  MD response: Took buspar years ago and wants to try it again and reduce gabapentin.  Currently on 1200 mg HS.  Reduce to 900 mg gabapentin.   Start buspirone 5 mg twice daily, and increase to 15 mg BID. Disc SE.  She agrees.  01/11/2021 appointment with the following noted: Completely off gabapentin for a couple of weeks maybe.  Initially anxiety a little high in AM.  Increased buspirone. Had increase anxiety when off Seroquel.  Was off about 4 days and memory was not better.  Anxiety is better now. Anxiety is a little higher off gabapentin but not unmanageable.  Sleep is not as good off the gabapentin.  More initial insomnia and takes melatonin to manage. Not marked difference with cognition off gabapentin. Read about antipsychotics reducing brain volume and still wants to stop Seroquel despite multiple failed attempts. Wants to switch from Seroquel to trazodone.  Plan: OK trial Seroquel reduction to 400 mg daily with no other med changes.  02/17/2021 appointment with the following noted: Stopped buspirone without change. Stopped gabapentin. Reduced Seroquel to 400 mg HS. She is convinced antipsychotic is causing brain loss.  She wants to be off antipsychotics bc of this concern and risk of dementia.  Don't think fears of dementia are unfounded. Doesn't believe she has psychotic sx. Wants to retry lithium and then trazodone in place of Seroquel.  Took lithium a long time.   Acknowledges prior failures weaning Seroquel multiple times but thinks maybe it was too fast.  Sleep most of the night and sleep less deep than 600 mg Seroquel.  Also less deep without gabapentin.  Altogether 6-7  hours but leaves the TV on.   In bed 8 watching TV and doesn't try to sleep until 1030. Plan: She  failed attempts to get off Seroquel multiple times. OK trial Seroquel reduction to 300 mg daily for 3-4 weeks, then 200 mg for 3-4 weeks. Disc withdrawal effects from Seroquel. Start lithium 300 mg nightly for a week then 600 mg nightly. Wait a week and get blood level.    04/12/2021 appointment with the following noted: Did get down to Seroquel 200 mg HS and up to lithium 600 HS and trazodone 100 mg HS. Sleep more interrupted but 6-7 hours with poor quality and less well rested with change.  Mood really good with lithium and maybe better. Often had rapid cycling and it seemed to stop. But "sleep sucks" and feels it affects her during the day Still takes mirtazapine 15 mg and added trazodone.  Sleep worse without trazodone. No SE with lithium. Cognition  more clear with better focus with less Seroquel.   Patient reports stable mood and denies depressed or irritable moods.  Patient denies any recent difficulty with anxiety, surprisingly. Denies appetite disturbance.  Patient reports that energy and motivation have been good.  Patient denies any difficulty with concentration.  Patient denies any suicidal ideation.  Plan: Continue Seroquel  200 mg for 3-4 weeks. Continue lithium 600 mg nightly. Check another level  Stop mirtazapine and trazodone Doxepin 10-30 mg HS   04/24/2021 phone call complaining of obsessive thinking and wanting medicine sent into the pharmacy. MD response: Her case is very complicated because she has complained of side effects from atypicals and worried about cognitive side effects of benzodiazepines.  She is  bipolar and therefore we cannot use SSRIs.  She does not want to take atypical antipsychotics.  She is on a low dose of Seroquel now and I am sure does not want to increase the dosage back up.  She has a as needed prescription for lorazepam she can use that.  I will not  make any other major med changes over the phone outside of a scheduled appointment.  If the symptoms are bothersome enough however going to the cancellation list and we will attempt to get her into an appointment sooner.  06/01/2021 appointment with the following noted: Thinks she stopped trazodone and ? Took doxepin.  She vaguely think she tried it and it didn't help sleep. Mood more stable on lithium 600 HS.  Prior mood cycling into dep and mania have resolved on it.. Still problems with her sleep. Increased Seroquel to 400 mg on her own for sleep added gabapentin 600 and sleep is better but wants off both of them. Still on mirtazapine 30.   Cholecystectomy scheduled for August. Had stopped Seroquel and then had intrusive thoughts which have now resolved.  At the time she called they were bad. Plan: Continue lithium 600 mg nightly. Check another level before next appt Wants to try again to taper quetiapine, will proceed as follows: Stop mirtazapine DT NR Retry Doxepin at higher dose of 50 mg up to 100 mg HS Once sleeping well then try to taper Seroquel very slowly such as reduce by 50 mg every 2 to 3 weeks or even slower if necessary.  09/05/2021 appointment with the following noted: Got down to Seroquel 200 and taking gabapentin 600 mg HS. Never tried 50-100 mg for sleep. Off track for 6 weeks without enough sleep.  Cholecystectomy recently. On phone with BF too much at night talking to him bc of his work schedule.   More irritable and tired.   Older cousin moved in for 3 mos and stressed by that and esp bc he brought a dog.  He's taking advantage of her and brought his GF. Stopped lithium AMA bc "I was afraid of it".   I want to go back on lithium bc it helped and retry doxepin at higher dose and try wean Seroquel.  Then wean gabapenin.   Was more focused and better mood stability on lithium. Mirtazapine does not help sleep much. Paranoid about being hurt if she dates and no history of  it.  Develop a whole story about what might happen. Plan: Stop mirtazapine DT NR Retry Doxepin at higher dose of 50 mg up to 100 mg HS Once sleeping well then try to taper Seroquel very slowly such as reduce by 50 mg every 2 to 3 weeks or even slower if necessary. Restart lithium 600 mg nightly. Check another level before next appt Don't stop meds AMA.   Call if there's a problem.  11/16/21 appt noted: Second week of Seroquel 150 mg daily. Is sleeping currently but not the same quality but not terrible.  On doxepin 75 mg HS Mood is good.  Likes the lithium better than Seroquel for mood.  Fewer hypomanic episones and more stable with less anger. Never got lithium level.  Says she'll do so. Rare lorazepam. No SE right now. Asks about Ozempic. Plan: continue Doxepin at higher dose of 50 mg up to 100 mg HS Once sleeping well then try to taper Seroquel very slowly such as reduce by 50 mg every 2 to 3 weeks or even slower if  necessary. Continue lithium 600 mg nightly. Get lithium level ASAP.  Then repeat lithium level with every 20 # of weight loss Don't stop meds AMA.   Call if there's a problem.  01/18/2022 appointment with the following noted: Ran out of doxepin and says refill rejected for 3 weeks. Was feeling kind of down. Down to Seroquel 25 mg HS for 2 + weeks. Still sleeping with some awakening.  Surprising good sleep.  6 and 1/2 hours of sleep and not drowsy daytime. Mood is fine.   Not sure if doxepin helped sleep but would like to have it. Patient reports stable mood and denies depressed or irritable moods.  Patient denies any recent difficulty with anxiety.  Patient denies difficulty with sleep initiation or maintenance. Denies appetite disturbance.  Patient reports that energy and motivation have been good.  Patient denies any difficulty with concentration.  Patient denies any suicidal ideation. Plan no med chages  04/17/22 appt noted: Takes gabapentin prn sleep and it  helps. Felt dull on lithium and U frequency so reduced it to 1of 300 mg  lithium daily. Down to Seroquel 25 mg HS and Sonata on occasion. Feels less dull with less lithium.  Thinks she was more depressed on lihtium at 600 mg daily.  Feels better the last 5 days.  Less urinary frequency. Sleep is less deep with less Seroquel and shorter duration 6 hours.  It's not terrrible but not great. 100 times better cognitively with less Seroquel.   Noticed it at work. Anxiety in check and she's surprised. Doesn't want med changes today. Plan; Cogniton better with less serotquel 25 mg HS. She wants to try lorazepam 0.5 mg HS in place of Sonata to see if she can sleep longer. Continue lithium 600 mg nightly was recommended but she dropped it to 300 mg nightly because she complained of dullness at the 600 mg and perhaps even some depression.  We agreed on a compromise of 450 mg nightly.  Even that will be below the usual therapeutic range and there is significant risk of mood instability and relapse with this and she accepts that risk.   07/04/22 appt noted: Using lorazepam about every 3 mos. Taking Sonata more about 1/2 the time. Taking quetiapine 25 mg nightly and lithium 450 mg daily. Don't think meds working out for her.  Not taking lithium enough.  More hypomania and sleep not enough nor restorative. Hypomania includes talking to herself a lot and tell funny stories to herself. Excitable mood and speech. More energy and need to burn it off.  Therapist noticed.   EMA.  About 5 hours sleep since off more Seroquel. On more lithium had less hypomania but felt flat on more lithium.  Still frequent urination.   Saw a urologist. Ativan not helpful sleep Plan: Cogniton complaints resolved with less serotquel 25 mg HS. But not sleeping.   Option switch to Thorazine 25 mg for less hangover.  She will call in a couple of weeks and if sleep is not better we will switch to the Thorazine.  She understands this is not an  antipsychotic at these low dosages but it can have other side effects like dizziness or drowsiness. Increase lithium to 450/600 mg QOD bc hypomania at 450 daily and dullness at 600 mg daily. Check lithium level in 2 weks.   09/07/22 NS  10/19/2022 appointment noted: Chlorpromazine made her feel wired and only taken once or twice and stopped. Still trouble with sleep unless takes more Seroquel.  Seroquel helps her go to sleep but not stay asleep. Thinks lithium is dulling and more periods of hypomania.  Wants to stay on lithium bc doesn't want to take antipsychotic for mood stabilizer.  Don't feel as connected to people on it bc feels flat.  Hard to feel emotional about things.  On Seroquel felt like personality was more normal.  Cognition definitely better off the Seroquel. Sonata about once every 3 weeks.    Avg 5 hours of sleep nightly without napping.  Not drowsy but is tired during the day.  Can't sleep withoout meds. Has been having a lot of hypomania but no one else is complaining.  More than I usually have and can notice it at work.  More chatty and more inclined to be outspoken and borderline inappopriate.  No anger problems.   Not depressed much ever.   Gall bladder removed last year and GI Sx ongoing with ongoing workup.  GI problems predate lithium she thinks.  02/19/23 appt noted:  Switch lithium in AM 2 weeks ago and less compliant.  Mood less stable and more irritable.  CO polyuria and nocturia. Sleep impacted by not usually taking quetiapine.  Sleep irregular 5-6 hours.  Recognizes she can be rude to her kids.. I'm not open to an antipsychotic.   Only taking lithium about 300 mg 3 days in last week. Taking ativan helped her feel much better.  She wants to take one of the 0.5 mg in the AM to calm her irritability. Plan discussed off-label clonidine for bipolar irritability and potential antimanic effect.  We did this given her reluctance to take alternatives and failure of multiple  options noted below  05/22/23 appt noted: Meds: clonidine 0.1 mg BID, Namenda , lithium 150 mg daily, Seroquel 25 mg HS and occ extra. Sonata prn for sleep. Seroquel helps her fall asleep.  Recently hypomania is different with clonidine.  Seeing it more often and lasts longer and wonders about taking more lithium when it happens.  Not happened in a while. First noted with subtherapeutic dose lithium when coming off of it.  Saying things she wouldn't normally say.  Clonidine helped but didn't stop it from happening.  Hypomania can last up to a week but feels more normal now.   When took steroid was hypomanic.  Still on NAC, levoxyl, vit D.  Taking atenolol also. Not particularly dep.  Anxiety manageable.  Not hypomanic now.  Feels more unrestrained in conversations and does concern her.  She feels her brain is clearer off the mood stabilizers.  Better in conversation.  Feels better emotionally bc feels more emotionally available to people than before.. Not currently irritable with clonidine, it helps a good bit.   Monitors BP and it is normal.  Saw card who said is ok to take clonidine.  Normal pulse.   Likes the clonidine as an off label mood stabilizer with benefit for hypomania and resolved irritablity. Plan: Clonidine 0.1 mg increase to 1 in the AM and 2 at night off label for manic irritability and mild hypomania.   08/22/23 appt noted: Psych med: NAC, clonidine 0.1 mg BID and 0.2 mg HS, quetiapine 50-75 mg HS, lithium 150 mg daily. Needed increase clonidine and card ok'd that.  Really liked clonidine initially but now jury is still out.   Don't generally have depressive sx but had been feeling down.  Also sometimes feels like she might explode with anger.   Sleep is not good with initial and terminal.  Awakens 330 and can't go back to sleep.   No sig paranoia but admits to feeling unsafe in public.  Something unexpected could happen  to she or her kids.  Don't feel that safe in grocery store.  But not avoiding necessary shopping.  Partly bc Cousin killed by train 6 mos ago. Asks about trying Remeron for sleep Cog function much better and thinks being off high dose quetiapine has helped.  Very happy in that regard.  When manic feels more reckless, spending, talk to herself, driven, reduced sleep, irritable and angry.  Past Psychiatric Medication Trials: Depakote NR, carbamazepine questionable side effects, lamotrigine lost response, Trileptal,   lithium , Vraylar 3 SE, Abilify, brief Latuda ? effect,  clozapine, olanzapine , Seroquel 600, risperidone 1 mg twice daily  , Saphris CO anxiety  Gabapentin cog SE,  Trazodone 200, doxepin 30 not sedating,  mirtazapine,  Thorazine 25 mg NR and activated Lorazepam 1 mg poor response for sleep  several SSRIs, clomipramine had vision side effects,   failed an attempt to switch from Seroquel to Vraylar early 2020  Failed switch from Seroquel to Jordan, Saphris, Vraylar and loxapine No psych hosp.  Review of Systems:  Review of Systems  Cardiovascular:  Negative for chest pain and palpitations.  Gastrointestinal:  Negative for diarrhea.  Genitourinary:  Positive for frequency.  Neurological:  Negative for dizziness, tremors and weakness.  Psychiatric/Behavioral:  Positive for agitation, decreased concentration, dysphoric mood and sleep disturbance. Negative for behavioral problems, confusion, hallucinations, self-injury and suicidal ideas. The patient is not nervous/anxious and is not hyperactive.     Medications: I have reviewed the patient's current medications.  Current Outpatient Medications  Medication Sig Dispense Refill   Acetylcysteine (NAC) 600 MG CAPS Take 1 capsule (600 mg total) by mouth daily. 90 capsule 1   atenolol (TENORMIN) 50 MG tablet Take 25 mg by mouth at bedtime.     B Complex Vitamins (VITAMIN B-COMPLEX) TABS Take 1 tablet by mouth at bedtime.     Calcium Carbonate-Vitamin D (CALCIUM-D PO) Take by mouth at  bedtime.     cefadroxil (DURICEF) 1 g tablet Take 1 tablet (1 g total) by mouth 2 (two) times daily. 14 tablet 0   chlorproMAZINE (THORAZINE) 25 MG tablet Take 1 tablet (25 mg total) by mouth at bedtime. 30 tablet 0   Cholecalciferol (VITAMIN D3) 5000 units CAPS Take 1 capsule by mouth at bedtime.     ESTRING 7.5 MCG/24HR vaginal ring      fluticasone (FLONASE) 50 MCG/ACT nasal spray Place into the nose.     folic acid (FOLVITE) 400 MCG tablet Take 400 mcg by mouth at bedtime.     levothyroxine (SYNTHROID, LEVOTHROID) 112 MCG tablet Take 112 mcg by mouth at bedtime.     linaclotide (LINZESS) 290 MCG CAPS capsule Take 290 mcg by mouth at bedtime.     lithium carbonate 150 MG capsule Take 1 capsule (150 mg total) by mouth daily. 90 capsule 0   lithium carbonate 150 MG capsule Take 1 capsule (150 mg total) by mouth daily. 90 capsule 1   lithium carbonate 300 MG capsule TAKE 2 CAPSULES DAILY 180 capsule 0   LORazepam (ATIVAN) 0.5 MG tablet TAKE 1 TO 2 TABLETS EVERY 8HOURS AS NEEDED FOR ANXIETY 60 tablet 0   MAGNESIUM PO Take 400 mg by mouth at bedtime.     memantine (NAMENDA) 10 MG tablet TAKE 1 TABLET TWICE A DAY 60 tablet 0   metroNIDAZOLE (FLAGYL) 500  MG tablet Take 1 tablet (500 mg total) by mouth 2 (two) times daily. 14 tablet 0   mirtazapine (REMERON) 30 MG tablet Take 1 tablet (30 mg total) by mouth at bedtime. 90 tablet 0   QUEtiapine (SEROQUEL) 50 MG tablet Take 1 tablet (50 mg total) by mouth at bedtime. 90 tablet 0   temazepam (RESTORIL) 15 MG capsule 2 times in 6 mos     tretinoin (RETIN-A) 0.025 % cream Apply topically at bedtime. face     zaleplon (SONATA) 10 MG capsule TAKE 1 CAPSULE AT BEDTIME  AS NEEDED FOR SLEEP 90 capsule 0   cloNIDine (CATAPRES) 0.1 MG tablet 1 in the AM and afternoon and 2 at night 360 tablet 1   No current facility-administered medications for this visit.    Medication Side Effects: None  Allergies: No Known Allergies  Past Medical History:  Diagnosis  Date   Anxiety    Bipolar 1 disorder (HCC)    followed by dr c. cottle   Carpal tunnel syndrome on both sides    CKD (chronic kidney disease), stage II    followed by pcp   Endometriosis    Hypothyroidism, postablative    endocrinologist--- dr c. Dewitt Hoes--- dx toxic multinodular thyroid 2000 w/ hyperthyroidism s/p RAI  2000, 2002, and 2004;  post hypothryoid in 2008;   last bx 2012 left side, benign per pt   Irritable bowel syndrome with constipation    followed by dr w. Jason Fila ([facility])   MDD (major depressive disorder)    Multinodular thyroid    in 2000 dx toxic post ablative   NAFLD (nonalcoholic fatty liver disease)    OSA (obstructive sleep apnea)    05-18-2021  per pt study done approx. 2018, told moderat OSA,  does uses cpap   Tachycardia    cardiologist--- Guerry Bruin NP @ [facility] in [place],  w/ palpitations, controlled w/ atenolol;  pt has had work-up per note echo 2014 normal ,  event monitor 2018 showedST w/ mild ST no arrhythmia,  ETT 02/ 2022 normal no ischemia, ef 67%   Wears contact lenses     Family History  Problem Relation Age of Onset   Thyroid disease Mother    Diabetes Mother    Hypertension Mother     Social History   Socioeconomic History   Marital status: Divorced    Spouse name: Not on file   Number of children: Not on file   Years of education: Not on file   Highest education level: Not on file  Occupational History   Not on file  Tobacco Use   Smoking status: Former    Current packs/day: 0.00    Types: Cigarettes    Start date: 46    Quit date: 2000    Years since quitting: 24.7   Smokeless tobacco: Never  Vaping Use   Vaping status: Never Used  Substance and Sexual Activity   Alcohol use: Yes    Alcohol/week: 1.0 standard drink of alcohol    Types: 1 Standard drinks or equivalent per week   Drug use: Never   Sexual activity: Not on file  Other Topics Concern   Not on file  Social History Narrative   Not on file   Social Determinants of  Health   Financial Resource Strain: Low Risk  (06/25/2023)   Received from [facility]   Overall Financial Resource Strain (CARDIA)    Difficulty of Paying Living Expenses: Not hard at all  Food Insecurity:  No Food Insecurity (06/25/2023)   Received from [facility]   Hunger Vital Sign    Worried About Running Out of Food in the Last Year: Never true    Ran Out of Food in the Last Year: Never true  Transportation Needs: No Transportation Needs (06/25/2023)   Received from [facility] - Transportation    Lack of Transportation (Medical): No    Lack of Transportation (Non-Medical): No  Physical Activity: Insufficiently Active (06/25/2023)   Received from [facility]   Exercise Vital Sign    Days of Exercise per Week: 1 day    Minutes of Exercise per Session: 30 min  Stress: No Stress Concern Present (06/25/2023)   Received from [facility] - Occupational Stress Questionnaire    Feeling of Stress : Not at all  Social Connections: Somewhat Isolated (06/25/2023)   Received from [facility]   Social Network    How would you rate your social network (family, work, friends)?: Restricted participation with some degree of social isolation  Intimate Partner Violence: Not At Risk (06/25/2023)   Received from [facility]   HITS    Over the last 12 months how often did your partner physically hurt you?: 1    Over the last 12 months how often did your partner insult you or talk down to you?: 1    Over the last 12 months how often did your partner threaten you with physical harm?: 1    Over the last 12 months how often did your partner scream or curse at you?: 1    Past Medical History, Surgical history, Social history, and Family history were reviewed and updated as appropriate.   Please see review of systems for further details on the patient's review from today.   Objective:   Physical Exam:  There were no vitals taken for  this visit.  Physical Exam Neurological:     Mental Status: She is alert and oriented to person, place, and time.     Cranial Nerves: No dysarthria.  Psychiatric:        Attention and Perception: She is inattentive.        Mood and Affect: Mood is anxious. Mood is not depressed.        Speech: Speech normal. Speech is not rapid and pressured or slurred.        Behavior: Behavior is cooperative.        Thought Content: Thought content is not paranoid or delusional. Thought content does not include homicidal or suicidal ideation. Thought content does not include suicidal plan.        Cognition and Memory: She does not exhibit impaired recent memory or impaired remote memory.        Judgment: Judgment normal.     Comments: Insight and judgment appear good. Irritable. No significant pressured speech or flight of ideas     Lab Review:     Component Value Date/Time   NA 136 04/10/2021 1035   K 4.3 04/10/2021 1035   CL 97 04/10/2021 1035   CO2 25 04/10/2021 1035   GLUCOSE 77 04/10/2021 1035   BUN 13 04/10/2021 1035   CREATININE 0.94 04/10/2021 1035   CALCIUM 9.7 04/10/2021 1035       Component Value Date/Time   WBC 6.6 05/24/2021 0630   RBC 3.79 (L) 05/24/2021 0630   HGB 11.3 (L) 05/24/2021 0630   HCT 35.0 (L) 05/24/2021 0630  PLT 325 05/24/2021 0630   MCV 92.3 05/24/2021 0630   MCH 29.8 05/24/2021 0630   MCHC 32.3 05/24/2021 0630   RDW 13.2 05/24/2021 0630    Lithium Lvl  Date Value Ref Range Status  10/10/2022 0.6 0.5 - 1.2 mmol/L Final    Comment:    A concentration of 0.5-0.8 mmol/L is advised for long-term use; concentrations of up to 1.2 mmol/L may be necessary during acute treatment.                                  Detection Limit = 0.1                           <0.1 indicates None Detected   12/05/2021 lithium level 0.6 on 600 mg daily stable  04/10/2021 lithium level 0.8 on 600 mg daily.  No results found for: "PHENYTOIN", "PHENOBARB", "VALPROATE", "CBMZ"    Took lab orders to PCP from visit in September and reports results: B12 >2000 Folate > 20 D 43.5 Iron TIBC 297 (250-450)  UIBC 156 (131-425)  Iron 141 (27-159)  Iron saturation 47 (15-55)   .res Assessment: Plan:    Bipolar disorder, in partial remission, most recent episode mixed (HCC) - Plan: cloNIDine (CATAPRES) 0.1 MG tablet, mirtazapine (REMERON) 30 MG tablet  Generalized anxiety disorder - Plan: mirtazapine (REMERON) 30 MG tablet  Mixed obsessional thoughts and acts  Insomnia due to mental condition - Plan: mirtazapine (REMERON) 30 MG tablet  Lithium use  MCI (mild cognitive impairment)  Low vitamin D level  Agoraphobia   30-minute video time with patient was spent . We discussed nature of her psychiatric diagnoses .   She was motivated to get off Seroquel because she believes it caused cognitive SE and fears brain matter loss.  Bipolar disorder Was worse with reduction in Seroquel until addtion of lithium helped and earlier She felt lithium had provided better mood stability than Seroquel. But she complained of dullness on lithium 600 and then became noncompliant and mood sx are worse with irritablity.  She refuses to increase it back to therapeutic, though we disc at length amiloride might remedy the polyuria.    Counseled patient regarding potential benefits, risks, and side effects of lithium to include potential risk of lithium affecting thyroid and renal function.  Discussed need for periodic lab monitoring to determine drug level and to assess for potential adverse effects.  Counseled patient regarding signs and symptoms of lithium toxicity and advised that they notify office immediately or seek urgent medical attention if experiencing these signs and symptoms.  Patient advised to contact office with any questions or concerns.  Hypomania disc extensively. She is much better with clonidine but not resolved.  She doesn't want to increase the lithium despite some  ongoing hypomania.  We discussed alternatives including switching to an antipsychotic type mood stabilizer.  She has been on all the traditional mood stabilizers that are not antipsychotics.   She does not want to try another antipsychotic mood stabilizer at this time though there are options like Caplyta. But she doesn't want to increase lithium but agrees to ultral low 150-300 mg daily; nor does she agree to antipsychotic.   Disc my recommendation for standard care as above but  The only reasonable option would be off label clonidine for irritability though this is not a traditional mood stabilize r and may fail.  Disc SE. This has been helpful but not perfect.  She wants to continue to use low dose Seroquel 25  and rec increase 50 mg HS until sometihing else works which also helps sleep.  She agrees. Sleep options Belsomra, Dayvigo, doxepin, Rozerem, hydroxyzine.  Prefer avoid BZ and bc cognitive concerns she has. Wants Sonata rarely.  She might try Belsomra.    Retry mirtazapine 30 mg HS for sleep and mood.  Bc she wants mood benefit then increase to 30.  Not likely to cause mania.  Normal CR and CA in 8/23 at [facility] 12/05/2021 lithium level 0.6 on 600 mg daily stable Option amiloride to manage polyuria but she doesn't want to stay on lithium anyway.  If we could get a higher lithium level it might work.  Don't stop meds AMA.   Call if there's a problem.  Sleep hygiene incl no bed without sleep.  She wants to continue memantine 10 BID off label for cognitive problems.  Because the cognitive problems resolved with reduction in Seroquel and lithium and the onset of hypomania.  Discussed potential metabolic side effects associated with atypical antipsychotics, as well as potential risk for movement side effects. Advised pt to contact office if movement side effects occur.   We discussed the short-term risks associated with benzodiazepines including sedation and increased fall risk among  others.  Discussed long-term side effect risk including dependence, potential withdrawal symptoms, and the potential eventual dose-related risk of dementia.  But recent studies from 2020 dispute this association between benzodiazepines and dementia risk. Newer studies in 2020 do not support an association with dementia.  Thyroid was normal recently. B12 and folate are normal D is OK but goal with her should be 50s-60s Disc reasons in detail including Dr. Charm Barges recommendation. Marland Kitchen  gave neuroprotective literature on lithium. Dr. Arlie Solomons article and gave article before.    Clonidine 0.1 mg  1 in the AM and 2 at night off label for manic irritability and mild hypomania.  Disc SE .    Might try Belsomra   FU 2 mos  Meredith Staggers, MD, DFAPA   Please see After Visit Summary for patient specific instructions.  Future Appointments  Date Time Provider Department Center  08/27/2023  8:00 AM Dewayne Hatch, Hulda Humphrey, PhD [facility] None  09/10/2023 10:00 AM Dewayne Hatch, Hulda Humphrey, PhD [facility] None  09/24/2023  8:00 AM Dewayne Hatch, Hulda Humphrey, PhD [facility] None  10/08/2023  8:00 AM Dewayne Hatch Hulda Humphrey, PhD [facility] None  11/05/2023  8:00 AM Dewayne Hatch, Hulda Humphrey, PhD [facility] None  11/19/2023  8:00 AM Mendelson, Hulda Humphrey, PhD [facility] None      No orders of the defined types were placed in this encounter.      -------------------------------

## 2023-08-23 ENCOUNTER — Other Ambulatory Visit: Payer: Self-pay | Admitting: Psychiatry

## 2023-08-23 DIAGNOSIS — F5105 Insomnia due to other mental disorder: Secondary | ICD-10-CM

## 2023-08-23 DIAGNOSIS — F3177 Bipolar disorder, in partial remission, most recent episode mixed: Secondary | ICD-10-CM

## 2023-08-23 DIAGNOSIS — F411 Generalized anxiety disorder: Secondary | ICD-10-CM

## 2023-08-23 MED ORDER — MIRTAZAPINE 30 MG PO TABS: 30.0000 mg | ORAL_TABLET | Freq: Every day | ORAL | 0 refills | Status: DC

## 2023-08-26 ENCOUNTER — Telehealth: Payer: Self-pay | Admitting: Psychiatry

## 2023-08-26 DIAGNOSIS — Z79899 Other long term (current) drug therapy: Secondary | ICD-10-CM

## 2023-08-26 DIAGNOSIS — F3162 Bipolar disorder, current episode mixed, moderate: Secondary | ICD-10-CM

## 2023-08-26 MED ORDER — LITHIUM CARBONATE 300 MG PO CAPS: 600.0000 mg | ORAL_CAPSULE | Freq: Every day | ORAL | 0 refills | Status: DC

## 2023-08-26 NOTE — Telephone Encounter (Signed)
Sent lithium.

## 2023-08-26 NOTE — Telephone Encounter (Signed)
Rx for Remeron had been previously sent. Will send lithium.

## 2023-08-26 NOTE — Telephone Encounter (Signed)
Patient called stating that she needs refill for Lithium 150mg  and Remeron 30mg . PH: [phone] Pharmacy [facility] [address] [place], [place]

## 2023-08-26 NOTE — Telephone Encounter (Signed)
A [facility] technician LVM @ 9:20a. They are requesting a call back to clarify a medication interaction.  [phone] opt 2  Ref # [id number]

## 2023-08-27 ENCOUNTER — Ambulatory Visit: Payer: Federal, State, Local not specified - PPO | Admitting: Clinical

## 2023-08-27 DIAGNOSIS — F3177 Bipolar disorder, in partial remission, most recent episode mixed: Secondary | ICD-10-CM

## 2023-08-27 NOTE — Progress Notes (Signed)
Time: 8:00 am-8:58 am CPT code: 16109U-04 Diagnosis Code: Bipolar Disorder, in partial Remission  Kristen Leon was seen remotely using secure video conferencing. She was in her home and therapist was in her office at the time of the appointment. Client is aware of risks of telehealth and consented to a virtual visit. Session focused on challenges that had arisen related to parenting her daughters. Therapist suggested communication strategies and resources. She is scheduled to be seen again in two weeks.   Treatment Plan Client Abilities/Strengths  Kristen Leon is honest and able to articulate her feelings and experiences. She has a history of consistently attending therapy appointment. She described herself as willing to engage in therapy homework. She has found that she benefits from being reminded of her goals, as she sometimes has a tendency to lose focus on her goals over time.   Client Treatment Preferences:  Kristen Leon prefers Monday and Tuesday appointments-this is best for her work schedule. Time availability varies day to day. She does not have a preference between virtual and in person. Client Statement of Needs Kristen Leon shared that she is seeking encouragement in therapy, as well as having thoughts challenged and being presented with alternate perspectives to consider. She shared that she has also benefited from advice to manage various situations in her life. She is also seeking techniques to build social connections with others.  Treatment Level   She would like biweekly appointments. Symptoms Kristen Leon tends to prioritize others and delay self-care. She reported struggling in several social situations. She also reported that she sometimes focuses on her ex-husband's life more than she would like.   Problems Addressed  Goals  Objective 1. Kristen Leon would like to learn to take better care of herself and prioritize her emotional needs.    Target Date: 09/26/2023 Frequency: Biweekly  Progress: 0  Modality: Individual therapy  2. Kristen Leon shared that she would like to be a more present and capable parent, and improve her parents skills  Target Date: 09/26/2023 Progress: 0 Frequency: Biweekly Modality: Individual Therapy 3. Kristen Leon would like to expand her social network and create social connections and friendships  Target Date: 09/26/2023 Progress: 0 Frequency: Biweekly Modality: Individual Therapy  Related Interventions Therapist will provide opportunities to process experiences in session. Therapist will help Kristen Leon to notice and disengage from maladaptive thoughts and behaviors using CBT-based strategies. Therapist will incorporate parent training strategies as appropriate. Therapist will engage Kristen Leon in a discussion of various social situations, including consideration of how to respond in a way that takes her toward her goals, and incorporating additional resources (including in-vivo role plays) as appropriate Therapist will help Kristen Leon to identify opportunities for increased social connection in her community. Therapist will provide referrals for additional resources as appropriate    Kristen Leon [name], PhD               Kristen Leon [name], PhD               Kristen Leon [name], PhD               Kristen Leon [name], PhD

## 2023-08-31 ENCOUNTER — Other Ambulatory Visit: Payer: Self-pay | Admitting: Psychiatry

## 2023-08-31 DIAGNOSIS — G3184 Mild cognitive impairment, so stated: Secondary | ICD-10-CM

## 2023-09-02 ENCOUNTER — Other Ambulatory Visit: Payer: Self-pay

## 2023-09-02 ENCOUNTER — Telehealth: Payer: Self-pay | Admitting: Psychiatry

## 2023-09-02 MED ORDER — LITHIUM CARBONATE 150 MG PO CAPS: 150.0000 mg | ORAL_CAPSULE | Freq: Every day | ORAL | 0 refills | Status: DC

## 2023-09-02 NOTE — Telephone Encounter (Signed)
Jacquelynne called at 1:53 to report that we sent in the wrong dose of Lithium.  She asked for the 150mg .  That is what she needs. Please send in the correct prescription/dose to [facility] - [place], [place] - [facility] AT [facility]

## 2023-09-02 NOTE — Telephone Encounter (Signed)
Sent!

## 2023-09-10 ENCOUNTER — Ambulatory Visit: Payer: Federal, State, Local not specified - PPO | Admitting: Clinical

## 2023-09-16 ENCOUNTER — Ambulatory Visit (INDEPENDENT_AMBULATORY_CARE_PROVIDER_SITE_OTHER): Payer: Federal, State, Local not specified - PPO | Admitting: Clinical

## 2023-09-16 DIAGNOSIS — F3177 Bipolar disorder, in partial remission, most recent episode mixed: Secondary | ICD-10-CM | POA: Diagnosis not present

## 2023-09-16 NOTE — Progress Notes (Signed)
Time: 8:00 am-8:58 am CPT code: 62952W-41 Diagnosis Code: Bipolar Disorder, in partial Remission  Chabely was seen remotely using secure video conferencing. She was in her home and therapist was in her office at the time of the appointment. Client is aware of risks of telehealth and consented to a virtual visit. Session focused on dynamics in her relationship with her mother. Kherington processed events in the relationship, including difficulty withholding details of her personal life, even though her mother has had a tendency to weaponize these against her. Therapist suggested trying not to share personal details, and tracking how this goes to discuss in her next session. She is scheduled to be seen again in one week.   Treatment Plan Client Abilities/Strengths  Lashala is honest and able to articulate her feelings and experiences. She has a history of consistently attending therapy appointment. She described herself as willing to engage in therapy homework. She has found that she benefits from being reminded of her goals, as she sometimes has a tendency to lose focus on her goals over time.   Client Treatment Preferences:  Kampbell prefers Monday and Tuesday appointments-this is best for her work schedule. Time availability varies day to day. She does not have a preference between virtual and in person. Client Statement of Needs Shyler shared that she is seeking encouragement in therapy, as well as having thoughts challenged and being presented with alternate perspectives to consider. She shared that she has also benefited from advice to manage various situations in her life. She is also seeking techniques to build social connections with others.  Treatment Level   She would like biweekly appointments. Symptoms Teriyah tends to prioritize others and delay self-care. She reported struggling in several social situations. She also reported that she sometimes focuses on her ex-husband's life more than she  would like.   Problems Addressed  Goals  Objective 1. Pharrah would like to learn to take better care of herself and prioritize her emotional needs.    Target Date: 09/26/2023 Frequency: Biweekly  Progress: 0 Modality: Individual therapy  2. Trystin shared that she would like to be a more present and capable parent, and improve her parents skills  Target Date: 09/26/2023 Progress: 0 Frequency: Biweekly Modality: Individual Therapy 3. Yamilette would like to expand her social network and create social connections and friendships  Target Date: 09/26/2023 Progress: 0 Frequency: Biweekly Modality: Individual Therapy  Related Interventions Therapist will provide opportunities to process experiences in session. Therapist will help Mikaia to notice and disengage from maladaptive thoughts and behaviors using CBT-based strategies. Therapist will incorporate parent training strategies as appropriate. Therapist will engage Kimberlly in a discussion of various social situations, including consideration of how to respond in a way that takes her toward her goals, and incorporating additional resources (including in-vivo role plays) as appropriate Therapist will help Nura to identify opportunities for increased social connection in her community. Therapist will provide referrals for additional resources as appropriate  Chrissie [name], PhD               Chrissie [name], PhD

## 2023-09-24 ENCOUNTER — Ambulatory Visit (INDEPENDENT_AMBULATORY_CARE_PROVIDER_SITE_OTHER): Payer: Federal, State, Local not specified - PPO | Admitting: Clinical

## 2023-09-24 DIAGNOSIS — F3177 Bipolar disorder, in partial remission, most recent episode mixed: Secondary | ICD-10-CM | POA: Diagnosis not present

## 2023-09-24 NOTE — Progress Notes (Signed)
Time: 8:00 am-8:58 am CPT code: 16109U-04 Diagnosis Code: Bipolar Disorder, in partial Remission  Ariyah was seen remotely using secure video conferencing. She was in her home and therapist was in her office at the time of the appointment. Client is aware of risks of telehealth and consented to a virtual visit. Session focused on dynamics in her relationship with her daughter. Therapist encouraged her to contact her daughter's therapist to share recent concerns and gain insight. Therapist also encouraged her toward specialized referrals for her daughter. She is scheduled to be seen again in two weeks.  Treatment Plan Client Abilities/Strengths  Neyra is honest and able to articulate her feelings and experiences. She has a history of consistently attending therapy appointment. She described herself as willing to engage in therapy homework. She has found that she benefits from being reminded of her goals, as she sometimes has a tendency to lose focus on her goals over time.   Client Treatment Preferences:  Jenitza prefers Monday and Tuesday appointments-this is best for her work schedule. Time availability varies day to day. She does not have a preference between virtual and in person. Client Statement of Needs Karyzma shared that she is seeking encouragement in therapy, as well as having thoughts challenged and being presented with alternate perspectives to consider. She shared that she has also benefited from advice to manage various situations in her life. She is also seeking techniques to build social connections with others.  Treatment Level   She would like biweekly appointments. Symptoms Aliese tends to prioritize others and delay self-care. She reported struggling in several social situations. She also reported that she sometimes focuses on her ex-husband's life more than she would like.   Problems Addressed  Goals  Objective 1. Chakera would like to learn to take better care of herself and  prioritize her emotional needs.    Target Date: 09/26/2023 Frequency: Biweekly  Progress: 0 Modality: Individual therapy  2. Tiniya shared that she would like to be a more present and capable parent, and improve her parents skills  Target Date: 09/26/2023 Progress: 0 Frequency: Biweekly Modality: Individual Therapy 3. Rayn would like to expand her social network and create social connections and friendships  Target Date: 09/26/2023 Progress: 0 Frequency: Biweekly Modality: Individual Therapy  Related Interventions Therapist will provide opportunities to process experiences in session. Therapist will help Lynnley to notice and disengage from maladaptive thoughts and behaviors using CBT-based strategies. Therapist will incorporate parent training strategies as appropriate. Therapist will engage Kamorah in a discussion of various social situations, including consideration of how to respond in a way that takes her toward her goals, and incorporating additional resources (including in-vivo role plays) as appropriate Therapist will help Blaike to identify opportunities for increased social connection in her community. Therapist will provide referrals for additional resources as appropriate    Chrissie [name], PhD               Chrissie [name], PhD

## 2023-10-08 ENCOUNTER — Ambulatory Visit: Payer: Federal, State, Local not specified - PPO | Admitting: Clinical

## 2023-10-08 DIAGNOSIS — F3177 Bipolar disorder, in partial remission, most recent episode mixed: Secondary | ICD-10-CM

## 2023-10-08 NOTE — Progress Notes (Addendum)
Time: 8:00 am-8:58 am CPT code: [id number] Diagnosis Code: Bipolar Disorder, in partial Remission  Kristen Leon was seen remotely using secure video conferencing. She was in her home and therapist was in her office at the time of the appointment. Client is aware of risks of telehealth and consented to a virtual visit. Session continued to focus on challenging dynamics with Kristen Leon's daughter. Therapist pointed out examples of impulsivity in Kristen Leon's descriptions of her daughter's behavior, and suggested exploring adjustments to medication management. Therapist suggested reaching out to [facility] or [facility] Oddi for specialized services. Kristen Leon also expressed difficulty engaging in self care, and therapist engaged her in discussion of how she can re-incorporate healthy habits into her routine. For homework, she will start her day with 5 minutes for herself, looking at her bird feeders. She is scheduled to be seen again in two weeks.  Treatment Plan Client Abilities/Strengths  Kristen Leon is honest and able to articulate her feelings and experiences. She has a history of consistently attending therapy appointment. She described herself as willing to engage in therapy homework. She has found that she benefits from being reminded of her goals, as she sometimes has a tendency to lose focus on her goals over time.   Client Treatment Preferences:  Kristen Leon prefers Monday and Tuesday appointments-this is best for her work schedule. Time availability varies day to day. She does not have a preference between virtual and in person. Client Statement of Needs Kristen Leon shared that she is seeking encouragement in therapy, as well as having thoughts challenged and being presented with alternate perspectives to consider. She shared that she has also benefited from advice to manage various situations in her life. She is also seeking techniques to build social connections with others.  Treatment Level   She would like  biweekly appointments. Symptoms Kristen Leon tends to prioritize others and delay self-care. She reported struggling in several social situations. She also reported that she sometimes focuses on her ex-husband's life more than she would like.   Problems Addressed  Goals  Objective 1. Kristen Leon would like to learn to take better care of herself and prioritize her emotional needs.    Target Date: 09/25/2024 Frequency: Biweekly  Progress: 0 Modality: Individual therapy  2. Kristen Leon shared that she would like to be a more present and capable parent, and improve her parents skills  Target Date: 09/25/2024 Progress: 0 Frequency: Biweekly Modality: Individual Therapy 3. Kristen Leon would like to expand her social network and create social connections and friendships  Target Date: 09/25/2024 Progress: 0 Frequency: Biweekly Modality: Individual Therapy  Related Interventions Therapist will provide opportunities to process experiences in session. Therapist will help Kristen Leon to notice and disengage from maladaptive thoughts and behaviors using CBT-based strategies. Therapist will incorporate parent training strategies as appropriate. Therapist will engage Kristen Leon in a discussion of various social situations, including consideration of how to respond in a way that takes her toward her goals, and incorporating additional resources (including in-vivo role plays) as appropriate Therapist will help Kristen Leon to identify opportunities for increased social connection in her community. Therapist will provide referrals for additional resources as appropriate    Kristen [name], PhD               Kristen [name], PhD

## 2023-10-16 ENCOUNTER — Encounter: Payer: Self-pay | Admitting: Psychiatry

## 2023-10-16 ENCOUNTER — Other Ambulatory Visit: Payer: Self-pay | Admitting: Psychiatry

## 2023-10-16 DIAGNOSIS — F3162 Bipolar disorder, current episode mixed, moderate: Secondary | ICD-10-CM

## 2023-10-16 DIAGNOSIS — F5105 Insomnia due to other mental disorder: Secondary | ICD-10-CM

## 2023-10-21 IMAGING — DX DG CHEST 2V
2 series · 2 of 2 positions shown · non-contrast
Comparison: None.

CLINICAL DATA: Cough.

EXAM:
CHEST - 2 VIEW

[chest pa]
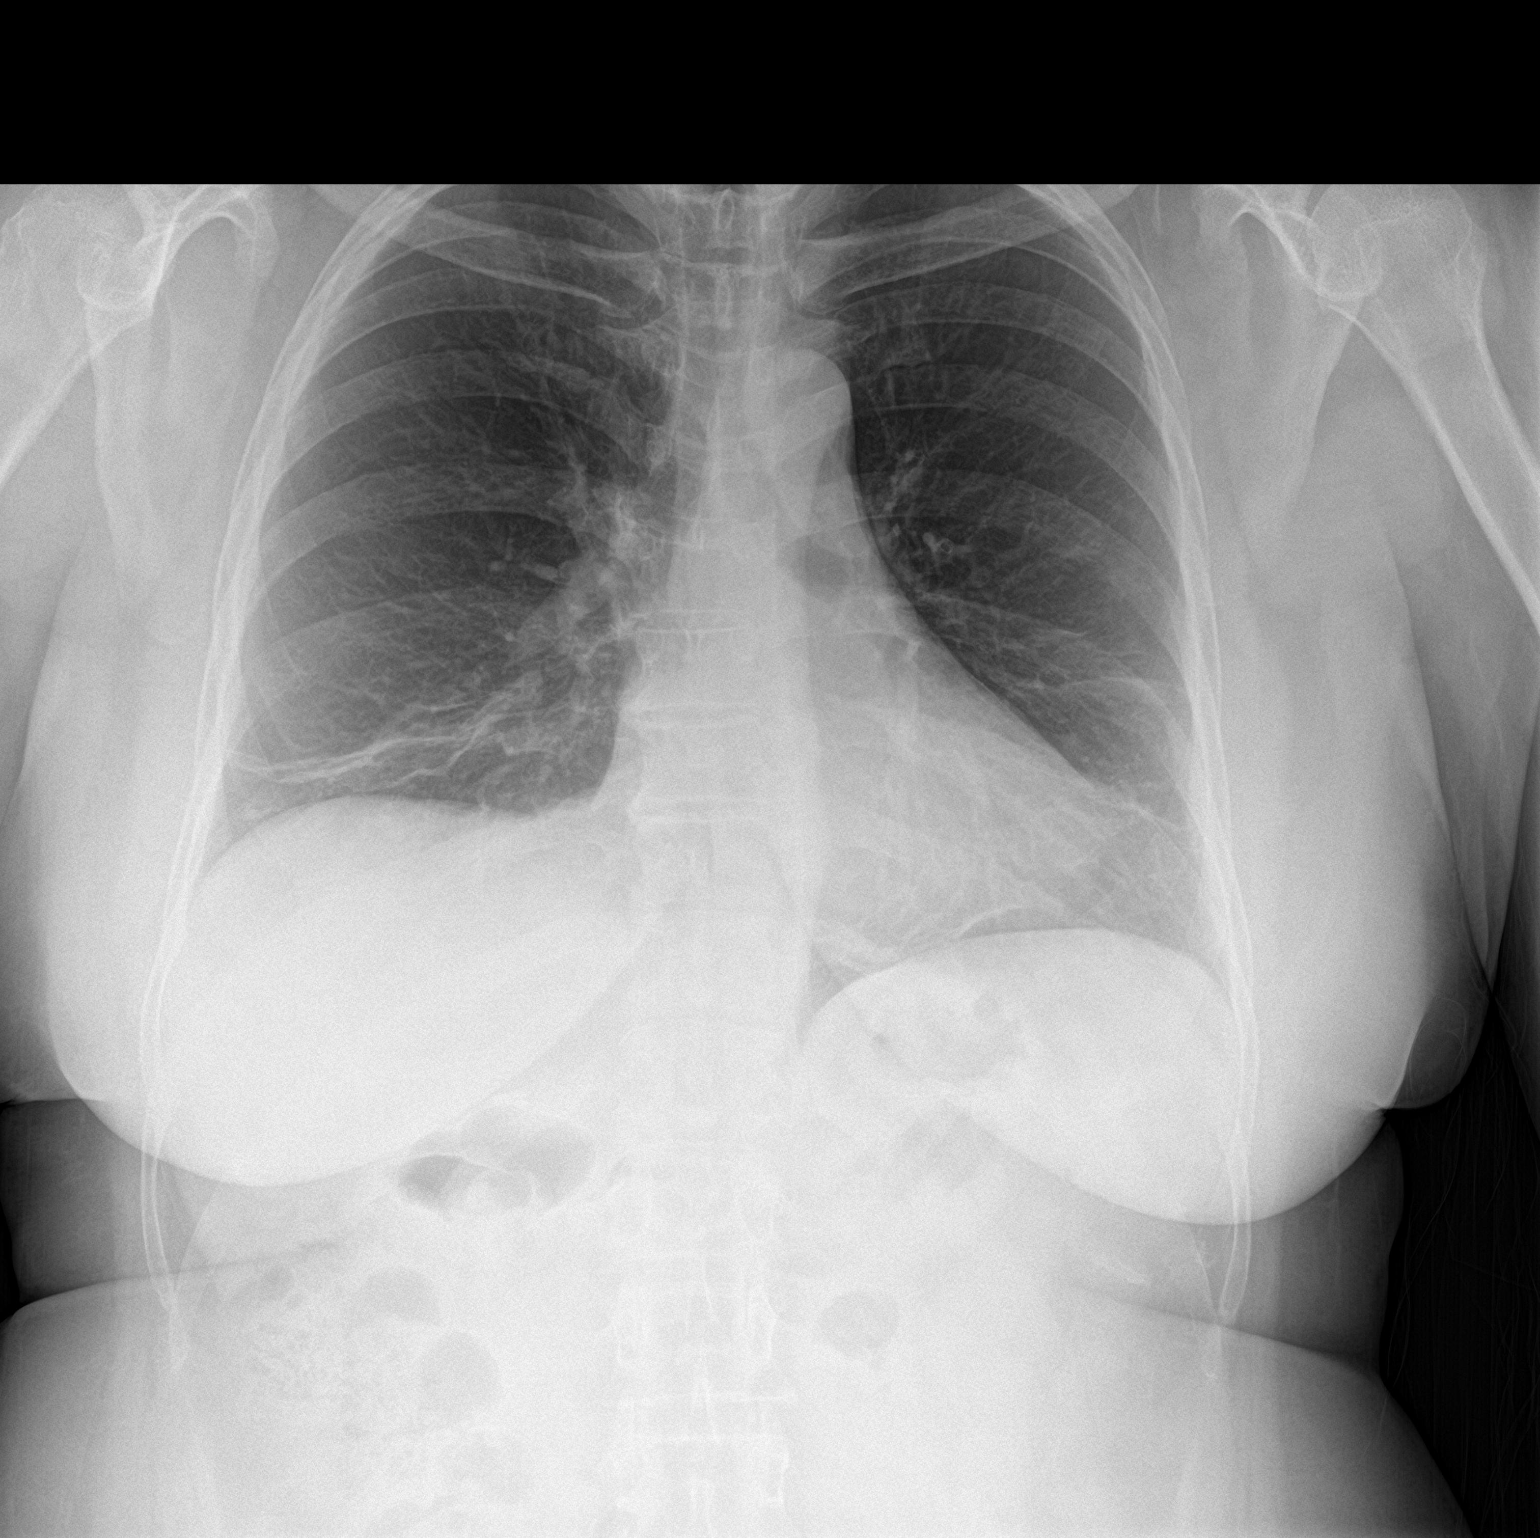

[chest lat]
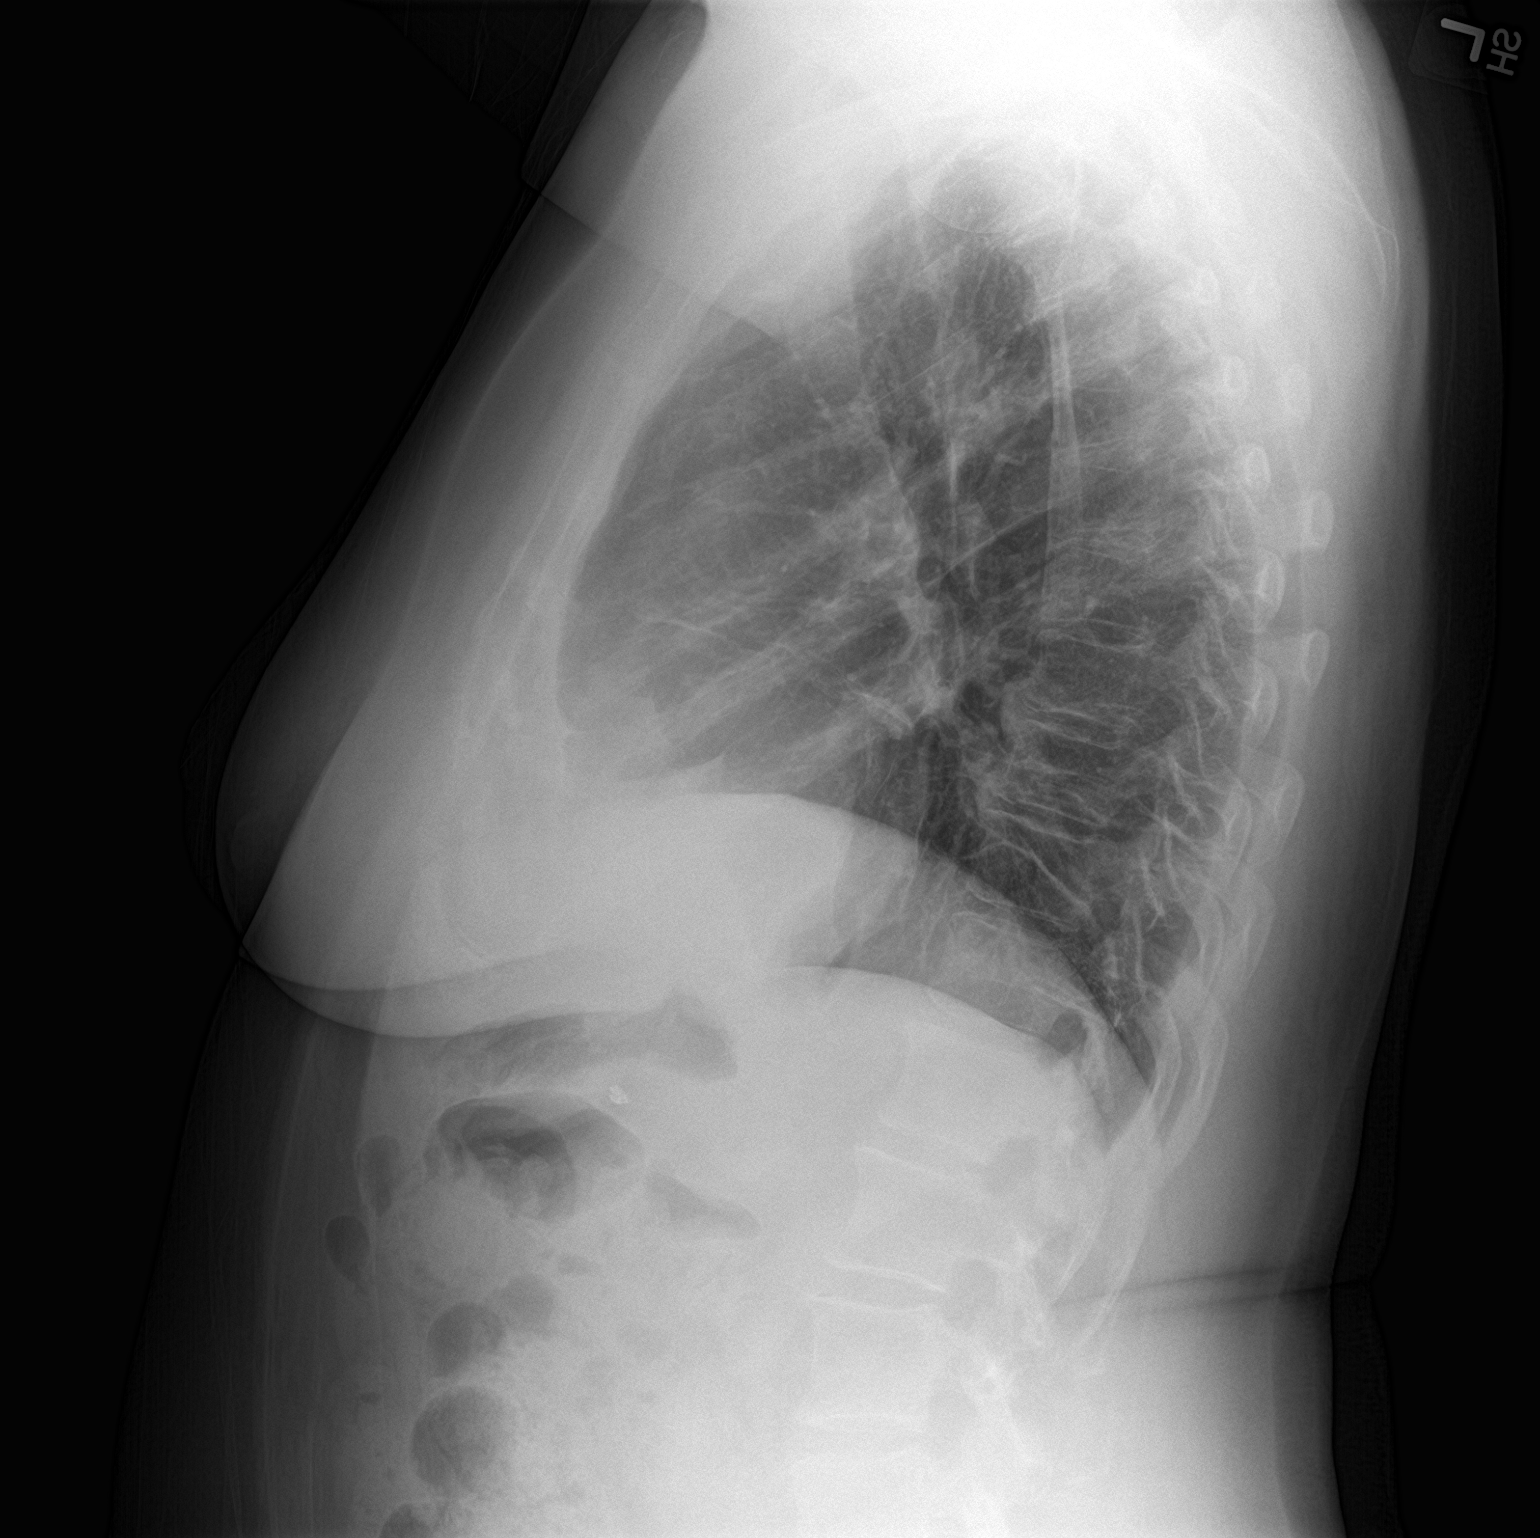

[2 of 2 positions shown; findings below may reference images not displayed]

FINDINGS: The heart size and mediastinal contours are within normal limits.
Mild bibasilar subsegmental atelectasis is noted. The visualized
skeletal structures are unremarkable.
IMPRESSION: Mild bibasilar subsegmental atelectasis.

## 2023-10-22 ENCOUNTER — Telehealth: Payer: Federal, State, Local not specified - PPO | Admitting: Psychiatry

## 2023-10-22 ENCOUNTER — Ambulatory Visit: Payer: Federal, State, Local not specified - PPO | Admitting: Clinical

## 2023-10-22 ENCOUNTER — Encounter: Payer: Self-pay | Admitting: Psychiatry

## 2023-10-22 ENCOUNTER — Ambulatory Visit (INDEPENDENT_AMBULATORY_CARE_PROVIDER_SITE_OTHER): Payer: Federal, State, Local not specified - PPO | Admitting: Clinical

## 2023-10-22 DIAGNOSIS — F5105 Insomnia due to other mental disorder: Secondary | ICD-10-CM

## 2023-10-22 DIAGNOSIS — G3184 Mild cognitive impairment, so stated: Secondary | ICD-10-CM

## 2023-10-22 DIAGNOSIS — F422 Mixed obsessional thoughts and acts: Secondary | ICD-10-CM

## 2023-10-22 DIAGNOSIS — F3177 Bipolar disorder, in partial remission, most recent episode mixed: Secondary | ICD-10-CM

## 2023-10-22 DIAGNOSIS — F3181 Bipolar II disorder: Secondary | ICD-10-CM

## 2023-10-22 DIAGNOSIS — F4000 Agoraphobia, unspecified: Secondary | ICD-10-CM

## 2023-10-22 DIAGNOSIS — R7989 Other specified abnormal findings of blood chemistry: Secondary | ICD-10-CM

## 2023-10-22 DIAGNOSIS — Z79899 Other long term (current) drug therapy: Secondary | ICD-10-CM

## 2023-10-22 DIAGNOSIS — F411 Generalized anxiety disorder: Secondary | ICD-10-CM | POA: Diagnosis not present

## 2023-10-22 MED ORDER — MIRTAZAPINE 30 MG PO TABS: 30.0000 mg | ORAL_TABLET | Freq: Every day | ORAL | 0 refills | Status: DC

## 2023-10-22 MED ORDER — CLONIDINE HCL 0.1 MG PO TABS
ORAL_TABLET | ORAL | 1 refills | Status: DC
Start: 2023-10-22 — End: 2024-07-21

## 2023-10-22 MED ORDER — MEMANTINE HCL 10 MG PO TABS
10.0000 mg | ORAL_TABLET | Freq: Two times a day (BID) | ORAL | 0 refills | Status: DC
Start: 2023-10-22 — End: 2024-01-20

## 2023-10-22 MED ORDER — ZALEPLON 10 MG PO CAPS: 10.0000 mg | ORAL_CAPSULE | Freq: Every day | ORAL | 0 refills | Status: AC
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
## 2023-10-22 NOTE — Progress Notes (Signed)
Kristen Leon [id number] 01-24-1966 57 y.o.  Video Visit via My Chart  I connected with pt by My Chart video and verified that I am speaking with the correct person using two identifiers.   I discussed the limitations, risks, security and privacy concerns of performing an evaluation and management service by My Chart  and the availability of in person appointments. I also discussed with the patient that there may be a patient responsible charge related to this service. The patient expressed understanding and agreed to proceed.  I discussed the assessment and treatment plan with the patient. The patient was provided an opportunity to ask questions and all were answered. The patient agreed with the plan and demonstrated an understanding of the instructions.   The patient was advised to call back or seek an in-person evaluation if the symptoms worsen or if the condition fails to improve as anticipated.  I provided 30 minutes of video time during this encounter.  The patient was located at home and the provider was located office. Session started [id number]  Subjective:   Patient ID:  Kristen Leon is a 57 y.o. (DOB 05/15/1966) female.  Chief Complaint:  Chief Complaint  Patient presents with   Follow-up   Memory Loss   Sleeping Problem     Medication Refill Pertinent negatives include no chest pain or weakness.  Anxiety Symptoms include decreased concentration. Patient reports no chest pain, confusion, dizziness, nervous/anxious behavior, palpitations or suicidal ideas.    Depression        Associated symptoms include decreased concentration.  Associated symptoms include no suicidal ideas.  Past medical history includes anxiety.      Kristen Leon presents  today for follow-up ofBipolar disorder, generalized anxiety disorder, nocturnal panic attacks, history of OCD, and mild cognitive impairment and worsening driving anxiety.  She has a goal of getting off the Seroquel in hopes of  improving memory.  at visit August 06, 2019.  We needed to start a mood stabilizer and discussed variety of options and decided to start with Latuda.  We started at 20 mg and the plan was to increase gradually to 80 mg daily.  If that was successful this follow-up was going to lead to a potential reduction in an attempt to wean Seroquel. Never got up to 80 mg Latuda.    Did not noticed much of a difference.  visit September 03, 2019.  The following changes were made: Increase vitamin D from 5000 units daily to 10K units daily Start Latuda  80 mg daily Start reduction in Seroquel next week and reduce once every 3 weeks. Reduced Seroquel from 600 to 200 mg HS.  At first had sleep and mood problems and angry.  Added in  gabapentin 300 mg total week ago and sleep better.  Able to sleep on her own.  Mood is better also.  visit October 12, 2019.  The following changes were made: Increase vitamin D from 5000 units daily to 10K units daily Continue Latuda  80 mg daily Continue gradual reduction in Seroquel next week and reduce once every 3 to 4 weeks by 50 mg. Trying to get rid of gabapentin and Seroquel for cognitive reasons primarily  seen December 16, 2019.  The following was noted: Down to Seroquel 150 HS for 3 weeks.  Quality of sleepy slightly off.  How long on this dose?  Tends to wake 30 min earlier than usual.  Bought magnesium.   Feels fine in the day.  Slightly less harried  and mentally cloudy with reduction in Seroquel. Likes the [place] as mood medication.   Overall mood and anxiety are under control.  Other concern is focus and memory. Wants to get off gabapentin and Seroquel for that reason.  Feels the meds may increase risk of dementia.  Overall she has seen some cognitive improvement since the reduction in Seroquel as noted. She was in courage to try reducing the Seroquel further gradually.  She was encouraged to leave the gabapentin at 300 mg daily because it was helpful.  seen March 14, 2020.  She was not doing well.  The following was noted: Couldn't sleep without Seroquel 200 mg.  Felt unstable and anxious.  For weeks felt she still struggled at 200 mg Seroquel and raised the dose all the way back up to where she started bc felt desperate and was on edge.   Early 2000's not on Seroquel and had a lot of anxiety and obsessive thought.  It helps anxiety. Mood better on Seroquel and feels better physically and mentally.  A little down with stress and failure of switch to [place].  little anger but not much depression and no other sx of mania.    Driving anxiety and sense unreality resolved.  Fine with driving now. The following changes were made: Continue vitamin D from 5000 units daily to 10K units daily Wean Latuda to 40 mg for a week and then stop it. Then start Saphris 5mg  HS and reduce Seroquel 400 mg at night for 5 days, then increase Saphris 10 mg at night and reduce Seroquel to 200 mg at night for 5 days, then increase Saphris to 15 HS and stop sEroquel.  04/12/2020 appointment the following is noted: As noted pt has been wanting off Seroquel so med changes last visit wre intended to help. More anxious with reduced gabapentin and Seroquel.  Hard to stop Seroquel bc both anxiety and insomnia and using 100 mg Seroquel.  Missed a day of work over it yesterday.  More sleep with Saphris than Latuda.  No SE with saphris. Anxiety included fidgety. Hasn't tried Saphris 15 mg daily yet.   Has felt in a little less foggy headed with reduced Seroquel and gabapentin.  Down on gabapentin for a while now and only a little change with that reduction.  A little better focus and memory now. Neuropsych testing next week. Plan: Increase Saphris 15 mg HS and stop Seroquel again.  Can reduce Seroquel to 50 or even lower if needed.    05/09/2020 phone call with the following noted: Patient had to go back up to 600 mg Seroquel at hs, she has tried the last few nights at 500 mg. She's been  unsuccessful tapering down and just wanted to have 200 mg and 100 mg tablets available to try and get it lowered.   07/07/2020 appointment with the following noted: Got close to off Seroquel but felt untethered and trouble with sleep initial and terminal. 5-6 hours on low dose Seroquel. Never tried Saphris 15 mg for reasons that are unclear.  Stopped shortly after the phone session from last time. Started Seroquel by 2002 and gabapentin in 2003.  Finds it very difficult to get off the meds and worry over instability. Plan: Start Saphris 10 mg at night and reduce Seroquel to 400 mg nightly for 4 nights, Then increase Saphris to 15 mg at night and reduce Seroquel to 200 mg for 4 nights, Then reduce Seroquel to 100 mg and continue Saphris at  15 mg nightly for 4 nights, Then stop Seroquel and if insomnia occurs, increase Saphris to 20 mg at night.  07/19/2020 phone call.  Restless legs on Saphris.  Given instructions to change to loxapine 100 mg nightly. 07/25/2020 phone call patient stating she wanted to try to continue Saphris but have treatment for the restless legs side effects. Prescription sent for ropinirole  09/13/2020 appointment with the following noted: Not taking Saphris.  At 10 mg HS CO jitteriness and anxiety the next day. Never took loxapine. Wonders about taking loxapine now.  Plan: Give her this change in instructions for transition from Seroquel to loxapine: Start loxapine 25 mg capsule 1 at night and reduce Seroquel to 400 mg nightly for 4 nights, Then increase loxapine to 2 capsules at night and reduce Seroquel to 200 mg for 4 nights, Then reduce Seroquel to 100 mg and increase loxapine to 3 capsules nightly for 4 nights, Then stop Seroquel and if insomnia occurs, increase loxapine to 4 capsules at night and notify MD.   Multiple phone calls since the last visit including the following. 10/24/2020:Telephone call to patient.  She experiences an increase in anxiety whenever she  reduces the Seroquel.  She is just started the transition from Seroquel to loxapine.  She is tolerating the loxapine well.  Encouraged her to just use the lorazepam and transition from Seroquel to loxapine.  It is hoped and expected that the loxapine will effectively manage anxiety as the Seroquel when she is on the full dose.  She agrees to the plan.  Lorazepam prescription sent in. 11/17/2020: RTC  See prior note.  Took 50 mg Seroquel last night.  Realized she made a mistake in dosing.  She stopped from 200 mg Seroquel instead of reducing to 100 mg as instructed.  Had gotten up to 4 loxapine without seroquel was jittery and like she'd come out of her skin and took lorazepam.  Still some problems with memory.  Last night took 50 mg Seroquel and was a little better. Much better today than yesterday.   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   Memory testing with visuospatial problems and was worked up over that.   Take 4 loxapine and start 75 mg Seroquel and taper from there more slowly. Appt 11/22/20  11/22/2020:Pt requesting Rx for Loxapine 25 mg 4/d  30 day and Seroquel 200 mg 3/d   30 day and Lorazepam 0.5 mg 1-2 tab every 8 hrs PRN for anxiety  @ [facility] [employer] on file. Apt RS from 12/21 to 2/9 and on canc list. Pt going out of town 12/27-1/3 asking for Rx before 12/27.  11/22/2020 MD response: I sent in the prescription for lorazepam.  Patient is not on quetiapine 200 mg 3 daily and I will not prescribe that dose.  Ask her what dose of quetiapine she is actually taking.  Also the insurance will not cover 4 loxapine capsules daily.  If she is taking 4 I will need to change it to 2 of the 50 mg capsules.  Please verify her current dose of loxapine and quetiapine.  I am weaning her off of quetiapine she should not be on that high of a dose of quetiapine. 11/23/2020:Pt called back stating she would like to stop Loxapine completely. She is having  blurred vision and jittery. She did not pick up Loxapine Rx. She is currently taking quetiapine 100 mg (4-25 mg tabs daily). Requesting to go  back to 600 mg daily. Requesting Rx for this.  Agreed 12/01/2020:Kristen Leon called to request that you decrease her gabapentin and prescribe Buspar.  Please call to discuss this.  appt 01/11/21.  MD response: Took buspar years ago and wants to try it again and reduce gabapentin.  Currently on 1200 mg HS.  Reduce to 900 mg gabapentin.   Start buspirone 5 mg twice daily, and increase to 15 mg BID. Disc SE.  She agrees.  01/11/2021 appointment with the following noted: Completely off gabapentin for a couple of weeks maybe.  Initially anxiety a little high in AM.  Increased buspirone. Had increase anxiety when off Seroquel.  Was off about 4 days and memory was not better.  Anxiety is better now. Anxiety is a little higher off gabapentin but not unmanageable.  Sleep is not as good off the gabapentin.  More initial insomnia and takes melatonin to manage. Not marked difference with cognition off gabapentin. Read about antipsychotics reducing brain volume and still wants to stop Seroquel despite multiple failed attempts. Wants to switch from Seroquel to trazodone.  Plan: OK trial Seroquel reduction to 400 mg daily with no other med changes.  02/17/2021 appointment with the following noted: Stopped buspirone without change. Stopped gabapentin. Reduced Seroquel to 400 mg HS. She is convinced antipsychotic is causing brain loss.  She wants to be off antipsychotics bc of this concern and risk of dementia.  Don't think fears of dementia are unfounded. Doesn't believe she has psychotic sx. Wants to retry lithium and then trazodone in place of Seroquel.  Took lithium a long time.   Acknowledges prior failures weaning Seroquel multiple times but thinks maybe it was too fast.  Sleep most of the night and sleep less deep than 600 mg Seroquel.  Also less deep without gabapentin.   Altogether 6-7 hours but leaves the TV on.   In bed 8 watching TV and doesn't try to sleep until 1030. Plan: She  failed attempts to get off Seroquel multiple times. OK trial Seroquel reduction to 300 mg daily for 3-4 weeks, then 200 mg for 3-4 weeks. Disc withdrawal effects from Seroquel. Start lithium 300 mg nightly for a week then 600 mg nightly. Wait a week and get blood level.    04/12/2021 appointment with the following noted: Did get down to Seroquel 200 mg HS and up to lithium 600 HS and trazodone 100 mg HS. Sleep more interrupted but 6-7 hours with poor quality and less well rested with change.  Mood really good with lithium and maybe better. Often had rapid cycling and it seemed to stop. But "sleep sucks" and feels it affects her during the day Still takes mirtazapine 15 mg and added trazodone.  Sleep worse without trazodone. No SE with lithium. Cognition  more clear with better focus with less Seroquel.   Patient reports stable mood and denies depressed or irritable moods.  Patient denies any recent difficulty with anxiety, surprisingly. Denies appetite disturbance.  Patient reports that energy and motivation have been good.  Patient denies any difficulty with concentration.  Patient denies any suicidal ideation.  Plan: Continue Seroquel  200 mg for 3-4 weeks. Continue lithium 600 mg nightly. Check another level  Stop mirtazapine and trazodone Doxepin 10-30 mg HS   04/24/2021 phone call complaining of obsessive thinking and wanting medicine sent into the pharmacy. MD response: Her case is very complicated because she has complained of side effects from atypicals and worried about cognitive side effects of benzodiazepines.  She is bipolar and therefore we cannot use SSRIs.  She does not want to take atypical antipsychotics.  She is on a low dose of Seroquel now and I am sure does not want to increase the dosage back up.  She has a as needed prescription for lorazepam she can use that.   I will not make any other major med changes over the phone outside of a scheduled appointment.  If the symptoms are bothersome enough however going to the cancellation list and we will attempt to get her into an appointment sooner.  06/01/2021 appointment with the following noted: Thinks she stopped trazodone and ? Took doxepin.  She vaguely think she tried it and it didn't help sleep. Mood more stable on lithium 600 HS.  Prior mood cycling into dep and mania have resolved on it.. Still problems with her sleep. Increased Seroquel to 400 mg on her own for sleep added gabapentin 600 and sleep is better but wants off both of them. Still on mirtazapine 30.   Cholecystectomy scheduled for August. Had stopped Seroquel and then had intrusive thoughts which have now resolved.  At the time she called they were bad. Plan: Continue lithium 600 mg nightly. Check another level before next appt Wants to try again to taper quetiapine, will proceed as follows: Stop mirtazapine DT NR Retry Doxepin at higher dose of 50 mg up to 100 mg HS Once sleeping well then try to taper Seroquel very slowly such as reduce by 50 mg every 2 to 3 weeks or even slower if necessary.  09/05/2021 appointment with the following noted: Got down to Seroquel 200 and taking gabapentin 600 mg HS. Never tried 50-100 mg for sleep. Off track for 6 weeks without enough sleep.  Cholecystectomy recently. On phone with BF too much at night talking to him bc of his work schedule.   More irritable and tired.   Older cousin moved in for 3 mos and stressed by that and esp bc he brought a dog.  He's taking advantage of her and brought his GF. Stopped lithium AMA bc "I was afraid of it".   I want to go back on lithium bc it helped and retry doxepin at higher dose and try wean Seroquel.  Then wean gabapenin.   Was more focused and better mood stability on lithium. Mirtazapine does not help sleep much. Paranoid about being hurt if she dates and no  history of it.  Develop a whole story about what might happen. Plan: Stop mirtazapine DT NR Retry Doxepin at higher dose of 50 mg up to 100 mg HS Once sleeping well then try to taper Seroquel very slowly such as reduce by 50 mg every 2 to 3 weeks or even slower if necessary. Restart lithium 600 mg nightly. Check another level before next appt Don't stop meds AMA.   Call if there's a problem.  11/16/21 appt noted: Second week of Seroquel 150 mg daily. Is sleeping currently but not the same quality but not terrible.  On doxepin 75 mg HS Mood is good.  Likes the lithium better than Seroquel for mood.  Fewer hypomanic episones and more stable with less anger. Never got lithium level.  Says she'll do so. Rare lorazepam. No SE right now. Asks about Ozempic. Plan: continue Doxepin at higher dose of 50 mg up to 100 mg HS Once sleeping well then try to taper Seroquel very slowly such as reduce by 50 mg every 2 to 3 weeks or even  slower if necessary. Continue lithium 600 mg nightly. Get lithium level ASAP.  Then repeat lithium level with every 20 # of weight loss Don't stop meds AMA.   Call if there's a problem.  01/18/2022 appointment with the following noted: Ran out of doxepin and says refill rejected for 3 weeks. Was feeling kind of down. Down to Seroquel 25 mg HS for 2 + weeks. Still sleeping with some awakening.  Surprising good sleep.  6 and 1/2 hours of sleep and not drowsy daytime. Mood is fine.   Not sure if doxepin helped sleep but would like to have it. Patient reports stable mood and denies depressed or irritable moods.  Patient denies any recent difficulty with anxiety.  Patient denies difficulty with sleep initiation or maintenance. Denies appetite disturbance.  Patient reports that energy and motivation have been good.  Patient denies any difficulty with concentration.  Patient denies any suicidal ideation. Plan no med chages  04/17/22 appt noted: Takes gabapentin prn sleep and  it helps. Felt dull on lithium and U frequency so reduced it to 1of 300 mg  lithium daily. Down to Seroquel 25 mg HS and Sonata on occasion. Feels less dull with less lithium.  Thinks she was more depressed on lihtium at 600 mg daily.  Feels better the last 5 days.  Less urinary frequency. Sleep is less deep with less Seroquel and shorter duration 6 hours.  It's not terrrible but not great. 100 times better cognitively with less Seroquel.   Noticed it at work. Anxiety in check and she's surprised. Doesn't want med changes today. Plan; Cogniton better with less serotquel 25 mg HS. She wants to try lorazepam 0.5 mg HS in place of Sonata to see if she can sleep longer. Continue lithium 600 mg nightly was recommended but she dropped it to 300 mg nightly because she complained of dullness at the 600 mg and perhaps even some depression.  We agreed on a compromise of 450 mg nightly.  Even that will be below the usual therapeutic range and there is significant risk of mood instability and relapse with this and she accepts that risk.   07/04/22 appt noted: Using lorazepam about every 3 mos. Taking Sonata more about 1/2 the time. Taking quetiapine 25 mg nightly and lithium 450 mg daily. Don't think meds working out for her.  Not taking lithium enough.  More hypomania and sleep not enough nor restorative. Hypomania includes talking to herself a lot and tell funny stories to herself. Excitable mood and speech. More energy and need to burn it off.  Therapist noticed.   EMA.  About 5 hours sleep since off more Seroquel. On more lithium had less hypomania but felt flat on more lithium.  Still frequent urination.   Saw a urologist. Ativan not helpful sleep Plan: Cogniton complaints resolved with less serotquel 25 mg HS. But not sleeping.   Option switch to Thorazine 25 mg for less hangover.  She will call in a couple of weeks and if sleep is not better we will switch to the Thorazine.  She understands this is not  an antipsychotic at these low dosages but it can have other side effects like dizziness or drowsiness. Increase lithium to 450/600 mg QOD bc hypomania at 450 daily and dullness at 600 mg daily. Check lithium level in 2 weks.   09/07/22 NS  10/19/2022 appointment noted: Chlorpromazine made her feel wired and only taken once or twice and stopped. Still trouble with sleep unless takes  more Seroquel.  Seroquel helps her go to sleep but not stay asleep. Thinks lithium is dulling and more periods of hypomania.  Wants to stay on lithium bc doesn't want to take antipsychotic for mood stabilizer.  Don't feel as connected to people on it bc feels flat.  Hard to feel emotional about things.  On Seroquel felt like personality was more normal.  Cognition definitely better off the Seroquel. Sonata about once every 3 weeks.    Avg 5 hours of sleep nightly without napping.  Not drowsy but is tired during the day.  Can't sleep withoout meds. Has been having a lot of hypomania but no one else is complaining.  More than I usually have and can notice it at work.  More chatty and more inclined to be outspoken and borderline inappopriate.  No anger problems.   Not depressed much ever.   Gall bladder removed last year and GI Sx ongoing with ongoing workup.  GI problems predate lithium she thinks.  02/19/23 appt noted:  Switch lithium in AM 2 weeks ago and less compliant.  Mood less stable and more irritable.  CO polyuria and nocturia. Sleep impacted by not usually taking quetiapine.  Sleep irregular 5-6 hours.  Recognizes she can be rude to her kids.. I'm not open to an antipsychotic.   Only taking lithium about 300 mg 3 days in last week. Taking ativan helped her feel much better.  She wants to take one of the 0.5 mg in the AM to calm her irritability. Plan discussed off-label clonidine for bipolar irritability and potential antimanic effect.  We did this given her reluctance to take alternatives and failure of  multiple options noted below  05/22/23 appt noted: Meds: clonidine 0.1 mg BID, Namenda , lithium 150 mg daily, Seroquel 25 mg HS and occ extra. Sonata prn for sleep. Seroquel helps her fall asleep.  Recently hypomania is different with clonidine.  Seeing it more often and lasts longer and wonders about taking more lithium when it happens.  Not happened in a while. First noted with subtherapeutic dose lithium when coming off of it.  Saying things she wouldn't normally say.  Clonidine helped but didn't stop it from happening.  Hypomania can last up to a week but feels more normal now.   When took steroid was hypomanic.  Still on NAC, levoxyl, vit D.  Taking atenolol also. Not particularly dep.  Anxiety manageable.  Not hypomanic now.  Feels more unrestrained in conversations and does concern her.  She feels her brain is clearer off the mood stabilizers.  Better in conversation.  Feels better emotionally bc feels more emotionally available to people than before.. Not currently irritable with clonidine, it helps a good bit.   Monitors BP and it is normal.  Saw card who said is ok to take clonidine.  Normal pulse.   Likes the clonidine as an off label mood stabilizer with benefit for hypomania and resolved irritablity. Plan: Clonidine 0.1 mg increase to 1 in the AM and 2 at night off label for manic irritability and mild hypomania.   08/22/23 appt noted: Psych med: NAC, clonidine 0.1 mg BID and 0.2 mg HS, quetiapine 50-75 mg HS, lithium 150 mg daily. Needed increase clonidine and card ok'd that.  Really liked clonidine initially but now jury is still out.   Don't generally have depressive sx but had been feeling down.  Also sometimes feels like she might explode with anger.   Sleep is not good with  initial and terminal. Awakens 330 and can't go back to sleep.   No sig paranoia but admits to feeling unsafe in public.  Something unexpected could happen  to she or her kids.  Don't feel that safe in grocery  store. But not avoiding necessary shopping.  Partly bc Cousin killed by train 6 mos ago. Asks about trying Remeron for sleep Cog function much better and thinks being off high dose quetiapine has helped.  Very happy in that regard.  10/22/23 appt note: Psych med: NAC, clonidine 0.1 mg BID and 0.2 mg HS, quetiapine 50-75 mg HS, lithium 150 mg daily, added mirtazapine 30 mg HS, sonata prn EMA,  The best I've ever been.  Better mood and sleep with mirtazapine.  Clonidine helped.  Not as impulsive, better self control.  Feels sharper with less Seroquel. Sleep can still be erratic.  Nocturia wakes her.   Occ takes more Neurontin 400 with 200 mg Seroquel when needs a good night sleep about once monthly.  Biggest change with clonidine and mirtazapine.   Tolerating meds.   Not jittery nor lightheaded.     When manic feels more reckless, spending, talk to herself, driven, reduced sleep, irritable and angry.  Past Psychiatric Medication Trials: Depakote NR, carbamazepine questionable side effects, lamotrigine lost response, Trileptal,   lithium , Vraylar 3 SE, Abilify, brief Latuda ? effect,  clozapine, olanzapine , Seroquel 600, risperidone 1 mg twice daily  , Saphris CO anxiety  Gabapentin cog SE,  Trazodone 200, doxepin 30 not sedating,  mirtazapine,  Thorazine 25 mg NR and activated Lorazepam 1 mg poor response for sleep  several SSRIs, clomipramine had vision side effects,   failed an attempt to switch from Seroquel to Vraylar early 2020  Failed switch from Seroquel to Jordan, Saphris, Vraylar and loxapine No psych hosp.  Review of Systems:  Review of Systems  Cardiovascular:  Negative for chest pain and palpitations.  Gastrointestinal:  Negative for diarrhea.  Genitourinary:  Positive for frequency.  Neurological:  Negative for dizziness, tremors and weakness.  Psychiatric/Behavioral:  Positive for decreased concentration and sleep disturbance. Negative for agitation, behavioral  problems, confusion, dysphoric mood, hallucinations, self-injury and suicidal ideas. The patient is not nervous/anxious and is not hyperactive.     Medications: I have reviewed the patient's current medications.  Current Outpatient Medications  Medication Sig Dispense Refill   Acetylcysteine (NAC) 600 MG CAPS Take 1 capsule (600 mg total) by mouth daily. 90 capsule 1   atenolol (TENORMIN) 50 MG tablet Take 25 mg by mouth at bedtime.     B Complex Vitamins (VITAMIN B-COMPLEX) TABS Take 1 tablet by mouth at bedtime.     Calcium Carbonate-Vitamin D (CALCIUM-D PO) Take by mouth at bedtime.     Cholecalciferol (VITAMIN D3) 5000 units CAPS Take 1 capsule by mouth at bedtime.     ESTRING 7.5 MCG/24HR vaginal ring      fluticasone (FLONASE) 50 MCG/ACT nasal spray Place into the nose.     folic acid (FOLVITE) 400 MCG tablet Take 400 mcg by mouth at bedtime.     levothyroxine (SYNTHROID, LEVOTHROID) 112 MCG tablet Take 112 mcg by mouth at bedtime.     linaclotide (LINZESS) 290 MCG CAPS capsule Take 290 mcg by mouth at bedtime.     lithium carbonate 150 MG capsule Take 1 capsule (150 mg total) by mouth daily. 90 capsule 0   LORazepam (ATIVAN) 0.5 MG tablet TAKE 1 TO 2 TABLETS EVERY 8HOURS AS NEEDED FOR ANXIETY  60 tablet 0   MAGNESIUM PO Take 400 mg by mouth at bedtime.     metroNIDAZOLE (FLAGYL) 500 MG tablet Take 1 tablet (500 mg total) by mouth 2 (two) times daily. 14 tablet 0   QUEtiapine (SEROQUEL) 50 MG tablet TAKE 1 TABLET AT BEDTIME 90 tablet 0   temazepam (RESTORIL) 15 MG capsule 2 times in 6 mos     tretinoin (RETIN-A) 0.025 % cream Apply topically at bedtime. face     cloNIDine (CATAPRES) 0.1 MG tablet 1 in the AM and afternoon and 2 at night 360 tablet 1   memantine (NAMENDA) 10 MG tablet Take 1 tablet (10 mg total) by mouth 2 (two) times daily. 180 tablet 0   mirtazapine (REMERON) 30 MG tablet Take 1 tablet (30 mg total) by mouth at bedtime. 90 tablet 0   zaleplon (SONATA) 10 MG capsule  Take 1 capsule (10 mg total) by mouth at bedtime. 90 capsule 0   No current facility-administered medications for this visit.    Medication Side Effects: None  Allergies: No Known Allergies  Past Medical History:  Diagnosis Date   Anxiety    Bipolar 1 disorder (HCC)    followed by dr c. cottle   Carpal tunnel syndrome on both sides    CKD (chronic kidney disease), stage II    followed by pcp   Endometriosis    Hypothyroidism, postablative    endocrinologist--- dr c. Dewitt Hoes--- dx toxic multinodular thyroid 2000 w/ hyperthyroidism s/p RAI  2000, 2002, and 2004;  post hypothryoid in 2008;   last bx 2012 left side, benign per pt   Irritable bowel syndrome with constipation    followed by dr w. Jason Fila ([facility])   MDD (major depressive disorder)    Multinodular thyroid    in 2000 dx toxic post ablative   NAFLD (nonalcoholic fatty liver disease)    OSA (obstructive sleep apnea)    05-18-2021  per pt study done approx. 2018, told moderat OSA,  does uses cpap   Tachycardia    cardiologist--- Guerry Bruin NP @ [facility] in [place],  w/ palpitations, controlled w/ atenolol;  pt has had work-up per note echo 2014 normal ,  event monitor 2018 showedST w/ mild ST no arrhythmia,  ETT 02/ 2022 normal no ischemia, ef 67%   Wears contact lenses     Family History  Problem Relation Age of Onset   Thyroid disease Mother    Diabetes Mother    Hypertension Mother     Social History   Socioeconomic History   Marital status: Divorced    Spouse name: Not on file   Number of children: Not on file   Years of education: Not on file   Highest education level: Not on file  Occupational History   Not on file  Tobacco Use   Smoking status: Former    Current packs/day: 0.00    Types: Cigarettes    Start date: 21    Quit date: 2000    Years since quitting: 24.9   Smokeless tobacco: Never  Vaping Use   Vaping status: Never Used  Substance and Sexual Activity   Alcohol use: Yes    Alcohol/week: 1.0  standard drink of alcohol    Types: 1 Standard drinks or equivalent per week   Drug use: Never   Sexual activity: Not on file  Other Topics Concern   Not on file  Social History Narrative   Not on file   Social Determinants of  Health   Financial Resource Strain: Low Risk  (06/25/2023)   Received from [facility]   Overall Financial Resource Strain (CARDIA)    Difficulty of Paying Living Expenses: Not hard at all  Food Insecurity: No Food Insecurity (06/25/2023)   Received from [facility]   Hunger Vital Sign    Worried About Running Out of Food in the Last Year: Never true    Ran Out of Food in the Last Year: Never true  Transportation Needs: No Transportation Needs (06/25/2023)   Received from [facility] - Transportation    Lack of Transportation (Medical): No    Lack of Transportation (Non-Medical): No  Physical Activity: Insufficiently Active (06/25/2023)   Received from [facility]   Exercise Vital Sign    Days of Exercise per Week: 1 day    Minutes of Exercise per Session: 30 min  Stress: No Stress Concern Present (06/25/2023)   Received from [facility] - Occupational Stress Questionnaire    Feeling of Stress : Not at all  Social Connections: Somewhat Isolated (06/25/2023)   Received from [facility]   Social Network    How would you rate your social network (family, work, friends)?: Restricted participation with some degree of social isolation  Intimate Partner Violence: Not At Risk (06/25/2023)   Received from [facility]   HITS    Over the last 12 months how often did your partner physically hurt you?: Never    Over the last 12 months how often did your partner insult you or talk down to you?: Never    Over the last 12 months how often did your partner threaten you with physical harm?: Never    Over the last 12 months how often did your partner scream or curse at you?: Never    Past Medical History,  Surgical history, Social history, and Family history were reviewed and updated as appropriate.   Please see review of systems for further details on the patient's review from today.   Objective:   Physical Exam:  There were no vitals taken for this visit.  Physical Exam Neurological:     Mental Status: She is alert and oriented to person, place, and time.     Cranial Nerves: No dysarthria.  Psychiatric:        Attention and Perception: She is inattentive.        Mood and Affect: Mood is anxious. Mood is not depressed.        Speech: Speech normal. Speech is not rapid and pressured or slurred.        Behavior: Behavior is cooperative.        Thought Content: Thought content is not paranoid or delusional. Thought content does not include homicidal or suicidal ideation. Thought content does not include suicidal plan.        Cognition and Memory: She does not exhibit impaired recent memory or impaired remote memory.        Judgment: Judgment normal.     Comments: Insight and judgment appear good. Irritable better with current med regimen No significant pressured speech or flight of ideas     Lab Review:     Component Value Date/Time   NA 136 04/10/2021 1035   K 4.3 04/10/2021 1035   CL 97 04/10/2021 1035   CO2 25 04/10/2021 1035   GLUCOSE 77 04/10/2021 1035   BUN 13 04/10/2021 1035   CREATININE 0.94 04/10/2021 1035  CALCIUM 9.7 04/10/2021 1035       Component Value Date/Time   WBC 6.6 05/24/2021 0630   RBC 3.79 (L) 05/24/2021 0630   HGB 11.3 (L) 05/24/2021 0630   HCT 35.0 (L) 05/24/2021 0630   PLT 325 05/24/2021 0630   MCV 92.3 05/24/2021 0630   MCH 29.8 05/24/2021 0630   MCHC 32.3 05/24/2021 0630   RDW 13.2 05/24/2021 0630    Lithium Lvl  Date Value Ref Range Status  10/10/2022 0.6 0.5 - 1.2 mmol/L Final    Comment:    A concentration of 0.5-0.8 mmol/L is advised for long-term use; concentrations of up to 1.2 mmol/L may be necessary during acute treatment.                                   Detection Limit = 0.1                           <0.1 indicates None Detected   12/05/2021 lithium level 0.6 on 600 mg daily stable  04/10/2021 lithium level 0.8 on 600 mg daily.  No results found for: "PHENYTOIN", "PHENOBARB", "VALPROATE", "CBMZ"   Took lab orders to PCP from visit in September and reports results: B12 >2000 Folate > 20 D 43.5 Iron TIBC 297 (250-450)  UIBC 156 (131-425)  Iron 141 (27-159)  Iron saturation 47 (15-55)   .res Assessment: Plan:    Bipolar disorder, in partial remission, most recent episode mixed (HCC) - Plan: cloNIDine (CATAPRES) 0.1 MG tablet, mirtazapine (REMERON) 30 MG tablet  Generalized anxiety disorder - Plan: mirtazapine (REMERON) 30 MG tablet  Mixed obsessional thoughts and acts  MCI (mild cognitive impairment) - Plan: memantine (NAMENDA) 10 MG tablet  Insomnia due to mental condition - Plan: mirtazapine (REMERON) 30 MG tablet, zaleplon (SONATA) 10 MG capsule  Low vitamin D level  Agoraphobia  Lithium use   30-minute video time with patient was spent . We discussed nature of her psychiatric diagnoses .   She was motivated to get off Seroquel because she believes it caused cognitive SE and fears brain matter loss.  Has reduced to 50 mg which remains required for sleep.  Bipolar disorder Was worse with reduction in Seroquel until addtion of lithium helped and earlier She felt lithium had provided better mood stability than Seroquel. But she complained of dullness on lithium 600 and then became noncompliant and mood sx got worse with irritablity.  She refused to increase it back to therapeuticlevels,  though we disc at length amiloride might remedy the polyuria.   She remains on 150 mg daily for neurotropic effects.  Counseled patient regarding potential benefits, risks, and side effects of lithium to include potential risk of lithium affecting thyroid and renal function.  Discussed need for periodic lab  monitoring to determine drug level and to assess for potential adverse effects.  Counseled patient regarding signs and symptoms of lithium toxicity and advised that they notify office immediately or seek urgent medical attention if experiencing these signs and symptoms.  Patient advised to contact office with any questions or concerns.  Hypomania disc extensively. She is much better with clonidine 0.1 mg BID and 0.2 mg HS.  Feels best she has in years.    We discussed alternatives including switching to an antipsychotic type mood stabilizer.  She has been on all the traditional mood stabilizers that are not antipsychotics.  She does not want to try another antipsychotic mood stabilizer at this time though there are options like Caplyta. But she doesn't want to increase lithium but agrees to ultral low 150-300 mg daily; nor does she agree to antipsychotic.   Disc my recommendation for standard care as above but  The only reasonable option would be off label clonidine for irritability though this is not a traditional mood stabilize r and may fail.  Disc SE. This has been helpful but not perfect.  She wants to continue to use low dose Seroquel 50 mg HS until sometihing else works which also helps sleep.  She agrees. Sleep options Belsomra, Dayvigo, doxepin, Rozerem, hydroxyzine.  Prefer avoid BZ and bc cognitive concerns she has. Wants Sonata rarely.  Benefit some for mood and sleep so continue mirtazapine 30 mg HS for sleep and mood.    Normal CR and CA in 8/23 at [facility] 12/05/2021 lithium level 0.6 on 600 mg daily stable Option amiloride to manage polyuria but she doesn't want to stay on lithium anyway.  If we could get a higher lithium level it might work.  Don't stop meds AMA.   Call if there's a problem.  Sleep hygiene incl no bed without sleep.  She wants to continue memantine 10 BID off label for cognitive problems.  Because the cognitive problems resolved with reduction in Seroquel and  lithium and the onset of hypomania.  Discussed potential metabolic side effects associated with atypical antipsychotics, as well as potential risk for movement side effects. Advised pt to contact office if movement side effects occur.   We discussed the short-term risks associated with benzodiazepines including sedation and increased fall risk among others.  Discussed long-term side effect risk including dependence, potential withdrawal symptoms, and the potential eventual dose-related risk of dementia.  But recent studies from 2020 dispute this association between benzodiazepines and dementia risk. Newer studies in 2020 do not support an association with dementia.  Thyroid was normal recently. B12 and folate are normal D is OK but goal with her should be 50s-60s Disc reasons in detail including Dr. Charm Barges recommendation. Marland Kitchen  gave neuroprotective literature on lithium. Dr. Arlie Solomons article and gave article before.    Clonidine 0.1 mg  1 in the AM and 2 at night off label for manic irritability and mild hypomania.  Disc SE .    Might try Belsomra   FU fEb  Meredith Staggers, MD, DFAPA   Please see After Visit Summary for patient specific instructions.  Future Appointments  Date Time Provider Department Center  11/05/2023  8:00 AM Mendelson, Hulda Humphrey, PhD [facility] None  11/19/2023  8:00 AM Dewayne Hatch, Hulda Humphrey, PhD [facility] None  12/11/2023  1:00 PM Dewayne Hatch, Hulda Humphrey, PhD [facility] None  12/25/2023  9:00 AM Dewayne Hatch, Hulda Humphrey, PhD [facility] None  01/06/2024 12:00 PM Dewayne Hatch, Hulda Humphrey, PhD [facility] None  01/20/2024 10:00 AM Dewayne Hatch, Hulda Humphrey, PhD [facility] None  02/12/2024  1:00 PM Mendelson, Hulda Humphrey, PhD [facility] None      No orders of the defined types were placed in this encounter.      -------------------------------

## 2023-11-05 ENCOUNTER — Ambulatory Visit: Payer: Federal, State, Local not specified - PPO | Admitting: Clinical

## 2023-11-05 DIAGNOSIS — F3177 Bipolar disorder, in partial remission, most recent episode mixed: Secondary | ICD-10-CM | POA: Diagnosis not present
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
## 2023-11-13 ENCOUNTER — Other Ambulatory Visit: Payer: Self-pay | Admitting: Psychiatry

## 2023-11-19 ENCOUNTER — Ambulatory Visit: Payer: Federal, State, Local not specified - PPO | Admitting: Clinical

## 2023-11-19 DIAGNOSIS — F3181 Bipolar II disorder: Secondary | ICD-10-CM
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
## 2023-12-11 ENCOUNTER — Ambulatory Visit: Payer: Federal, State, Local not specified - PPO | Admitting: Clinical

## 2023-12-11 DIAGNOSIS — F3177 Bipolar disorder, in partial remission, most recent episode mixed: Secondary | ICD-10-CM

## 2023-12-11 DIAGNOSIS — F3181 Bipolar II disorder: Secondary | ICD-10-CM
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
## 2023-12-25 ENCOUNTER — Ambulatory Visit: Payer: Federal, State, Local not specified - PPO | Admitting: Clinical

## 2023-12-25 DIAGNOSIS — F3181 Bipolar II disorder: Secondary | ICD-10-CM | POA: Diagnosis not present
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
## 2023-12-27 ENCOUNTER — Other Ambulatory Visit: Payer: Self-pay | Admitting: Psychiatry

## 2023-12-27 DIAGNOSIS — F3162 Bipolar disorder, current episode mixed, moderate: Secondary | ICD-10-CM

## 2023-12-27 DIAGNOSIS — F5105 Insomnia due to other mental disorder: Secondary | ICD-10-CM

## 2024-01-06 ENCOUNTER — Ambulatory Visit (INDEPENDENT_AMBULATORY_CARE_PROVIDER_SITE_OTHER): Payer: Federal, State, Local not specified - PPO | Admitting: Clinical

## 2024-01-06 DIAGNOSIS — F3181 Bipolar II disorder: Secondary | ICD-10-CM | POA: Diagnosis not present
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
## 2024-01-18 ENCOUNTER — Other Ambulatory Visit: Payer: Self-pay | Admitting: Psychiatry

## 2024-01-18 DIAGNOSIS — G3184 Mild cognitive impairment, so stated: Secondary | ICD-10-CM

## 2024-01-20 ENCOUNTER — Ambulatory Visit: Payer: Federal, State, Local not specified - PPO | Admitting: Clinical

## 2024-01-20 DIAGNOSIS — F3181 Bipolar II disorder: Secondary | ICD-10-CM | POA: Diagnosis not present
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
## 2024-01-24 ENCOUNTER — Other Ambulatory Visit: Payer: Self-pay | Admitting: Psychiatry

## 2024-01-29 ENCOUNTER — Telehealth (INDEPENDENT_AMBULATORY_CARE_PROVIDER_SITE_OTHER): Payer: Federal, State, Local not specified - PPO | Admitting: Psychiatry

## 2024-01-29 ENCOUNTER — Encounter: Payer: Self-pay | Admitting: Psychiatry

## 2024-01-29 DIAGNOSIS — F3181 Bipolar II disorder: Secondary | ICD-10-CM

## 2024-01-29 DIAGNOSIS — F411 Generalized anxiety disorder: Secondary | ICD-10-CM

## 2024-01-29 DIAGNOSIS — F5105 Insomnia due to other mental disorder: Secondary | ICD-10-CM

## 2024-01-29 DIAGNOSIS — F422 Mixed obsessional thoughts and acts: Secondary | ICD-10-CM | POA: Diagnosis not present

## 2024-01-29 DIAGNOSIS — G3184 Mild cognitive impairment, so stated: Secondary | ICD-10-CM | POA: Diagnosis not present

## 2024-01-29 DIAGNOSIS — F3162 Bipolar disorder, current episode mixed, moderate: Secondary | ICD-10-CM

## 2024-01-29 MED ORDER — QUETIAPINE FUMARATE 300 MG PO TABS
300.0000 mg | ORAL_TABLET | Freq: Every day | ORAL | 0 refills | Status: DC
Start: 2024-01-29 — End: 2024-04-12

## 2024-01-29 NOTE — Progress Notes (Signed)
 Kristen Leon [id number] [birth year] 58 y.o.  Video Visit via My Chart  I connected with pt by My Chart video and verified that I am speaking with the correct person using two identifiers.   I discussed the limitations, risks, security and privacy concerns of performing an evaluation and management service by My Chart  and the availability of in person appointments. I also discussed with the patient that there may be a patient responsible charge related to this service. The patient expressed understanding and agreed to proceed.  I discussed the assessment and treatment plan with the patient. The patient was provided an opportunity to ask questions and all were answered. The patient agreed with the plan and demonstrated an understanding of the instructions.   The patient was advised to call back or seek an in-person evaluation if the symptoms worsen or if the condition fails to improve as anticipated.  I provided 30 minutes of video time during this encounter.  The patient was located at home and the provider was located office. Session started 1130-1200  Subjective:   Patient ID:  Kristen Leon is a 58 y.o. (DOB 03-23-1966) female.  Chief Complaint:  Chief Complaint  Patient presents with   Follow-up     Kristen Leon presents  today for follow-up ofBipolar disorder, generalized anxiety disorder, nocturnal panic attacks, history of OCD, and mild cognitive impairment and worsening driving anxiety.  She has a goal of getting off the Seroquel in hopes of improving memory.  at visit August 06, 2019.  We needed to start a mood stabilizer and discussed variety of options and decided to start with Latuda.  We started at 20 mg and the plan was to increase gradually to 80 mg daily.  If that was successful this follow-up was going to lead to a potential reduction in an attempt to wean Seroquel. Never got up to 80 mg Latuda.    Did not noticed much of a difference.  visit September 03, 2019.  The  following changes were made: Increase vitamin D from 5000 units daily to 10K units daily Start Latuda  80 mg daily Start reduction in Seroquel next week and reduce once every 3 weeks. Reduced Seroquel from 600 to 200 mg HS.  At first had sleep and mood problems and angry.  Added in  gabapentin 300 mg total week ago and sleep better.  Able to sleep on her own.  Mood is better also.  visit October 12, 2019.  The following changes were made: Increase vitamin D from 5000 units daily to 10K units daily Continue Latuda  80 mg daily Continue gradual reduction in Seroquel next week and reduce once every 3 to 4 weeks by 50 mg. Trying to get rid of gabapentin and Seroquel for cognitive reasons primarily  seen December 16, 2019.  The following was noted: Down to Seroquel 150 HS for 3 weeks.  Quality of sleepy slightly off.  How long on this dose?  Tends to wake 30 min earlier than usual.  Bought magnesium.   Feels fine in the day.  Slightly less harried and mentally cloudy with reduction in Seroquel. Likes the Springer as mood medication.   Overall mood and anxiety are under control.  Other concern is focus and memory. Wants to get off gabapentin and Seroquel for that reason.  Feels the meds may increase risk of dementia.  Overall she has seen some cognitive improvement since the reduction in Seroquel as noted. She was in courage to try reducing the  Seroquel further gradually.  She was encouraged to leave the gabapentin at 300 mg daily because it was helpful.  seen March 14, 2020.  She was not doing well.  The following was noted: Couldn't sleep without Seroquel 200 mg.  Felt unstable and anxious.  For weeks felt she still struggled at 200 mg Seroquel and raised the dose all the way back up to where she started bc felt desperate and was on edge.   Early 2000's not on Seroquel and had a lot of anxiety and obsessive thought.  It helps anxiety. Mood better on Seroquel and feels better physically and mentally.   A little down with stress and failure of switch to [place].  little anger but not much depression and no other sx of mania.    Driving anxiety and sense unreality resolved.  Fine with driving now. The following changes were made: Continue vitamin D from 5000 units daily to 10K units daily Wean Latuda to 40 mg for a week and then stop it. Then start Saphris 5mg  HS and reduce Seroquel 400 mg at night for 5 days, then increase Saphris 10 mg at night and reduce Seroquel to 200 mg at night for 5 days, then increase Saphris to 15 HS and stop sEroquel.  04/12/2020 appointment the following is noted: As noted pt has been wanting off Seroquel so med changes last visit wre intended to help. More anxious with reduced gabapentin and Seroquel.  Hard to stop Seroquel bc both anxiety and insomnia and using 100 mg Seroquel.  Missed a day of work over it yesterday.  More sleep with Saphris than Latuda.  No SE with saphris. Anxiety included fidgety. Hasn't tried Saphris 15 mg daily yet.   Has felt in a little less foggy headed with reduced Seroquel and gabapentin.  Down on gabapentin for a while now and only a little change with that reduction.  A little better focus and memory now. Neuropsych testing next week. Plan: Increase Saphris 15 mg HS and stop Seroquel again.  Can reduce Seroquel to 50 or even lower if needed.    05/09/2020 phone call with the following noted: Patient had to go back up to 600 mg Seroquel at hs, she has tried the last few nights at 500 mg. She's been unsuccessful tapering down and just wanted to have 200 mg and 100 mg tablets available to try and get it lowered.   07/07/2020 appointment with the following noted: Got close to off Seroquel but felt untethered and trouble with sleep initial and terminal. 5-6 hours on low dose Seroquel. Never tried Saphris 15 mg for reasons that are unclear.  Stopped shortly after the phone session from last time. Started Seroquel by 2002 and gabapentin in 2003.   Finds it very difficult to get off the meds and worry over instability. Plan: Start Saphris 10 mg at night and reduce Seroquel to 400 mg nightly for 4 nights, Then increase Saphris to 15 mg at night and reduce Seroquel to 200 mg for 4 nights, Then reduce Seroquel to 100 mg and continue Saphris at 15 mg nightly for 4 nights, Then stop Seroquel and if insomnia occurs, increase Saphris to 20 mg at night.  07/19/2020 phone call.  Restless legs on Saphris.  Given instructions to change to loxapine 100 mg nightly. 07/25/2020 phone call patient stating she wanted to try to continue Saphris but have treatment for the restless legs side effects. Prescription sent for ropinirole  09/13/2020 appointment with the following noted:  Not taking Saphris.  At 10 mg HS CO jitteriness and anxiety the next day. Never took loxapine. Wonders about taking loxapine now.  Plan: Give her this change in instructions for transition from Seroquel to loxapine: Start loxapine 25 mg capsule 1 at night and reduce Seroquel to 400 mg nightly for 4 nights, Then increase loxapine to 2 capsules at night and reduce Seroquel to 200 mg for 4 nights, Then reduce Seroquel to 100 mg and increase loxapine to 3 capsules nightly for 4 nights, Then stop Seroquel and if insomnia occurs, increase loxapine to 4 capsules at night and notify MD.   Multiple phone calls since the last visit including the following. 10/24/2020:Telephone call to patient.  She experiences an increase in anxiety whenever she reduces the Seroquel.  She is just started the transition from Seroquel to loxapine.  She is tolerating the loxapine well.  Encouraged her to just use the lorazepam and transition from Seroquel to loxapine.  It is hoped and expected that the loxapine will effectively manage anxiety as the Seroquel when she is on the full dose.  She agrees to the plan.  Lorazepam prescription sent in. 11/17/2020: RTC  See prior note.  Took 50 mg Seroquel last night.   Realized she made a mistake in dosing.  She stopped from 200 mg Seroquel instead of reducing to 100 mg as instructed.  Had gotten up to 4 loxapine without seroquel was jittery and like she'd come out of her skin and took lorazepam.  Still some problems with memory.  Last night took 50 mg Seroquel and was a little better. Much better today than yesterday.   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   Memory testing with visuospatial problems and was worked up over that.   Take 4 loxapine and start 75 mg Seroquel and taper from there more slowly. Appt 11/22/20  11/22/2020:Pt requesting Rx for Loxapine 25 mg 4/d  30 day and Seroquel 200 mg 3/d   30 day and Lorazepam 0.5 mg 1-2 tab every 8 hrs PRN for anxiety  @ [facility] [employer] on file. Apt RS from 12/21 to 2/9 and on canc list. Pt going out of town 12/27-1/3 asking for Rx before 12/27.  11/22/2020 MD response: I sent in the prescription for lorazepam.  Patient is not on quetiapine 200 mg 3 daily and I will not prescribe that dose.  Ask her what dose of quetiapine she is actually taking.  Also the insurance will not cover 4 loxapine capsules daily.  If she is taking 4 I will need to change it to 2 of the 50 mg capsules.  Please verify her current dose of loxapine and quetiapine.  I am weaning her off of quetiapine she should not be on that high of a dose of quetiapine. 11/23/2020:Pt called back stating she would like to stop Loxapine completely. She is having blurred vision and jittery. She did not pick up Loxapine Rx. She is currently taking quetiapine 100 mg (4-25 mg tabs daily). Requesting to go back to 600 mg daily. Requesting Rx for this.  Agreed 12/01/2020:Kristen Leon called to request that you decrease her gabapentin and prescribe Buspar.  Please call to discuss this.  appt 01/11/21.  MD response: Took buspar years ago and wants to try it again and reduce gabapentin.  Currently on 1200 mg HS.  Reduce to 900  mg gabapentin.   Start buspirone 5 mg twice daily, and increase to  15 mg BID. Disc SE.  She agrees.  01/11/2021 appointment with the following noted: Completely off gabapentin for a couple of weeks maybe.  Initially anxiety a little high in AM.  Increased buspirone. Had increase anxiety when off Seroquel.  Was off about 4 days and memory was not better.  Anxiety is better now. Anxiety is a little higher off gabapentin but not unmanageable.  Sleep is not as good off the gabapentin.  More initial insomnia and takes melatonin to manage. Not marked difference with cognition off gabapentin. Read about antipsychotics reducing brain volume and still wants to stop Seroquel despite multiple failed attempts. Wants to switch from Seroquel to trazodone.  Plan: OK trial Seroquel reduction to 400 mg daily with no other med changes.  02/17/2021 appointment with the following noted: Stopped buspirone without change. Stopped gabapentin. Reduced Seroquel to 400 mg HS. She is convinced antipsychotic is causing brain loss.  She wants to be off antipsychotics bc of this concern and risk of dementia.  Don't think fears of dementia are unfounded. Doesn't believe she has psychotic sx. Wants to retry lithium and then trazodone in place of Seroquel.  Took lithium a long time.   Acknowledges prior failures weaning Seroquel multiple times but thinks maybe it was too fast.  Sleep most of the night and sleep less deep than 600 mg Seroquel.  Also less deep without gabapentin.  Altogether 6-7 hours but leaves the TV on.   In bed 8 watching TV and doesn't try to sleep until 1030. Plan: She  failed attempts to get off Seroquel multiple times. OK trial Seroquel reduction to 300 mg daily for 3-4 weeks, then 200 mg for 3-4 weeks. Disc withdrawal effects from Seroquel. Start lithium 300 mg nightly for a week then 600 mg nightly. Wait a week and get blood level.    04/12/2021 appointment with the following noted: Did get down  to Seroquel 200 mg HS and up to lithium 600 HS and trazodone 100 mg HS. Sleep more interrupted but 6-7 hours with poor quality and less well rested with change.  Mood really good with lithium and maybe better. Often had rapid cycling and it seemed to stop. But "sleep sucks" and feels it affects her during the day Still takes mirtazapine 15 mg and added trazodone.  Sleep worse without trazodone. No SE with lithium. Cognition  more clear with better focus with less Seroquel.   Patient reports stable mood and denies depressed or irritable moods.  Patient denies any recent difficulty with anxiety, surprisingly. Denies appetite disturbance.  Patient reports that energy and motivation have been good.  Patient denies any difficulty with concentration.  Patient denies any suicidal ideation.  Plan: Continue Seroquel  200 mg for 3-4 weeks. Continue lithium 600 mg nightly. Check another level  Stop mirtazapine and trazodone Doxepin 10-30 mg HS   04/24/2021 phone call complaining of obsessive thinking and wanting medicine sent into the pharmacy. MD response: Her case is very complicated because she has complained of side effects from atypicals and worried about cognitive side effects of benzodiazepines.  She is bipolar and therefore we cannot use SSRIs.  She does not want to take atypical antipsychotics.  She is on a low dose of Seroquel now and I am sure does not want to increase the dosage back up.  She has a as needed prescription for lorazepam she can use that.  I will not make any other major med changes over the phone outside of a scheduled  appointment.  If the symptoms are bothersome enough however going to the cancellation list and we will attempt to get her into an appointment sooner.  06/01/2021 appointment with the following noted: Thinks she stopped trazodone and ? Took doxepin.  She vaguely think she tried it and it didn't help sleep. Mood more stable on lithium 600 HS.  Prior mood cycling into dep  and mania have resolved on it.. Still problems with her sleep. Increased Seroquel to 400 mg on her own for sleep added gabapentin 600 and sleep is better but wants off both of them. Still on mirtazapine 30.   Cholecystectomy scheduled for August. Had stopped Seroquel and then had intrusive thoughts which have now resolved.  At the time she called they were bad. Plan: Continue lithium 600 mg nightly. Check another level before next appt Wants to try again to taper quetiapine, will proceed as follows: Stop mirtazapine DT NR Retry Doxepin at higher dose of 50 mg up to 100 mg HS Once sleeping well then try to taper Seroquel very slowly such as reduce by 50 mg every 2 to 3 weeks or even slower if necessary.  09/05/2021 appointment with the following noted: Got down to Seroquel 200 and taking gabapentin 600 mg HS. Never tried 50-100 mg for sleep. Off track for 6 weeks without enough sleep.  Cholecystectomy recently. On phone with BF too much at night talking to him bc of his work schedule.   More irritable and tired.   Older cousin moved in for 3 mos and stressed by that and esp bc he brought a dog.  He's taking advantage of her and brought his GF. Stopped lithium AMA bc "I was afraid of it".   I want to go back on lithium bc it helped and retry doxepin at higher dose and try wean Seroquel.  Then wean gabapenin.   Was more focused and better mood stability on lithium. Mirtazapine does not help sleep much. Paranoid about being hurt if she dates and no history of it.  Develop a whole story about what might happen. Plan: Stop mirtazapine DT NR Retry Doxepin at higher dose of 50 mg up to 100 mg HS Once sleeping well then try to taper Seroquel very slowly such as reduce by 50 mg every 2 to 3 weeks or even slower if necessary. Restart lithium 600 mg nightly. Check another level before next appt Don't stop meds AMA.   Call if there's a problem.  11/16/21 appt noted: Second week of Seroquel 150 mg  daily. Is sleeping currently but not the same quality but not terrible.  On doxepin 75 mg HS Mood is good.  Likes the lithium better than Seroquel for mood.  Fewer hypomanic episones and more stable with less anger. Never got lithium level.  Says she'll do so. Rare lorazepam. No SE right now. Asks about Ozempic. Plan: continue Doxepin at higher dose of 50 mg up to 100 mg HS Once sleeping well then try to taper Seroquel very slowly such as reduce by 50 mg every 2 to 3 weeks or even slower if necessary. Continue lithium 600 mg nightly. Get lithium level ASAP.  Then repeat lithium level with every 20 # of weight loss Don't stop meds AMA.   Call if there's a problem.  01/18/2022 appointment with the following noted: Ran out of doxepin and says refill rejected for 3 weeks. Was feeling kind of down. Down to Seroquel 25 mg HS for 2 + weeks. Still sleeping with  some awakening.  Surprising good sleep.  6 and 1/2 hours of sleep and not drowsy daytime. Mood is fine.   Not sure if doxepin helped sleep but would like to have it. Patient reports stable mood and denies depressed or irritable moods.  Patient denies any recent difficulty with anxiety.  Patient denies difficulty with sleep initiation or maintenance. Denies appetite disturbance.  Patient reports that energy and motivation have been good.  Patient denies any difficulty with concentration.  Patient denies any suicidal ideation. Plan no med chages  04/17/22 appt noted: Takes gabapentin prn sleep and it helps. Felt dull on lithium and U frequency so reduced it to 1of 300 mg  lithium daily. Down to Seroquel 25 mg HS and Sonata on occasion. Feels less dull with less lithium.  Thinks she was more depressed on lihtium at 600 mg daily.  Feels better the last 5 days.  Less urinary frequency. Sleep is less deep with less Seroquel and shorter duration 6 hours.  It's not terrrible but not great. 100 times better cognitively with less Seroquel.   Noticed  it at work. Anxiety in check and she's surprised. Doesn't want med changes today. Plan; Cogniton better with less serotquel 25 mg HS. She wants to try lorazepam 0.5 mg HS in place of Sonata to see if she can sleep longer. Continue lithium 600 mg nightly was recommended but she dropped it to 300 mg nightly because she complained of dullness at the 600 mg and perhaps even some depression.  We agreed on a compromise of 450 mg nightly.  Even that will be below the usual therapeutic range and there is significant risk of mood instability and relapse with this and she accepts that risk.   07/04/22 appt noted: Using lorazepam about every 3 mos. Taking Sonata more about 1/2 the time. Taking quetiapine 25 mg nightly and lithium 450 mg daily. Don't think meds working out for her.  Not taking lithium enough.  More hypomania and sleep not enough nor restorative. Hypomania includes talking to herself a lot and tell funny stories to herself. Excitable mood and speech. More energy and need to burn it off.  Therapist noticed.   EMA.  About 5 hours sleep since off more Seroquel. On more lithium had less hypomania but felt flat on more lithium.  Still frequent urination.   Saw a urologist. Ativan not helpful sleep Plan: Cogniton complaints resolved with less serotquel 25 mg HS. But not sleeping.   Option switch to Thorazine 25 mg for less hangover.  She will call in a couple of weeks and if sleep is not better we will switch to the Thorazine.  She understands this is not an antipsychotic at these low dosages but it can have other side effects like dizziness or drowsiness. Increase lithium to 450/600 mg QOD bc hypomania at 450 daily and dullness at 600 mg daily. Check lithium level in 2 weks.   09/07/22 NS  10/19/2022 appointment noted: Chlorpromazine made her feel wired and only taken once or twice and stopped. Still trouble with sleep unless takes more Seroquel.  Seroquel helps her go to sleep but not stay  asleep. Thinks lithium is dulling and more periods of hypomania.  Wants to stay on lithium bc doesn't want to take antipsychotic for mood stabilizer.  Don't feel as connected to people on it bc feels flat.  Hard to feel emotional about things.  On Seroquel felt like personality was more normal.  Cognition definitely better off the  Seroquel. Sonata about once every 3 weeks.    Avg 5 hours of sleep nightly without napping.  Not drowsy but is tired during the day.  Can't sleep withoout meds. Has been having a lot of hypomania but no one else is complaining.  More than I usually have and can notice it at work.  More chatty and more inclined to be outspoken and borderline inappopriate.  No anger problems.   Not depressed much ever.   Gall bladder removed last year and GI Sx ongoing with ongoing workup.  GI problems predate lithium she thinks.  02/19/23 appt noted:  Switch lithium in AM 2 weeks ago and less compliant.  Mood less stable and more irritable.  CO polyuria and nocturia. Sleep impacted by not usually taking quetiapine.  Sleep irregular 5-6 hours.  Recognizes she can be rude to her kids.. I'm not open to an antipsychotic.   Only taking lithium about 300 mg 3 days in last week. Taking ativan helped her feel much better.  She wants to take one of the 0.5 mg in the AM to calm her irritability. Plan discussed off-label clonidine for bipolar irritability and potential antimanic effect.  We did this given her reluctance to take alternatives and failure of multiple options noted below  05/22/23 appt noted: Meds: clonidine 0.1 mg BID, Namenda , lithium 150 mg daily, Seroquel 25 mg HS and occ extra. Sonata prn for sleep. Seroquel helps her fall asleep.  Recently hypomania is different with clonidine.  Seeing it more often and lasts longer and wonders about taking more lithium when it happens.  Not happened in a while. First noted with subtherapeutic dose lithium when coming off of it.  Saying things she  wouldn't normally say.  Clonidine helped but didn't stop it from happening.  Hypomania can last up to a week but feels more normal now.   When took steroid was hypomanic.  Still on NAC, levoxyl, vit D.  Taking atenolol also. Not particularly dep.  Anxiety manageable.  Not hypomanic now.  Feels more unrestrained in conversations and does concern her.  She feels her brain is clearer off the mood stabilizers.  Better in conversation.  Feels better emotionally bc feels more emotionally available to people than before.. Not currently irritable with clonidine, it helps a good bit.   Monitors BP and it is normal.  Saw card who said is ok to take clonidine.  Normal pulse.   Likes the clonidine as an off label mood stabilizer with benefit for hypomania and resolved irritablity. Plan: Clonidine 0.1 mg increase to 1 in the AM and 2 at night off label for manic irritability and mild hypomania.   08/22/23 appt noted: Psych med: NAC, clonidine 0.1 mg BID and 0.2 mg HS, quetiapine 50-75 mg HS, lithium 150 mg daily. Needed increase clonidine and card ok'd that.  Really liked clonidine initially but now jury is still out.   Don't generally have depressive sx but had been feeling down.  Also sometimes feels like she might explode with anger.   Sleep is not good with initial and terminal. Awakens 330 and can't go back to sleep.   No sig paranoia but admits to feeling unsafe in public.  Something unexpected could happen  to she or her kids.  Don't feel that safe in grocery store. But not avoiding necessary shopping.  Partly bc Cousin killed by train 6 mos ago. Asks about trying Remeron for sleep Cog function much better and thinks being off  high dose quetiapine has helped.  Very happy in that regard.  10/22/23 appt note: Psych med: NAC, clonidine 0.1 mg BID and 0.2 mg HS, quetiapine 50-75 mg HS, lithium 150 mg daily, added mirtazapine 30 mg HS, sonata prn EMA,  The best I've ever been.  Better mood and sleep with  mirtazapine.  Clonidine helped.  Not as impulsive, better self control.  Feels sharper with less Seroquel. Sleep can still be erratic.  Nocturia wakes her.   Occ takes more Neurontin 400 with 200 mg Seroquel when needs a good night sleep about once monthly.  Biggest change with clonidine and mirtazapine.   Tolerating meds.   Not jittery nor lightheaded.   Plan: no changes  01/29/24 appt noted: Med: no NAC out, clonidine 0.1 mg BID and 0.2 mg HS, quetiapine  200 mg HS, lithium 150 mg daily, stopped mirtazapine 30 mg HS, sonata rare prn EMA, Memantine 10 BID Sleep very disturbed.  Went to neuro, will be doing more memory testing.  Worry over lack of sleep affecting brain.  Neuro rec increase quetiapine bc less risk from it than chronic insomnia.  She increased from 50 to 200 mg HS. Stopped mirtazapine bc felt better and didn't think she needed it.  Sleep not worse off it.  Getting about 6 hours now and feels better about it than before increase quetiapine. Works for [employer]. And supervises a number of attorneys.  For a couple of mos was working 18 hours per day and doesn't feel she ever recovered.  More normal schedule in early Jan.  Was getting 3 hours sleep at the time and didn't feel she needed sleep at the time.   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 She recognizes was a bit impulsive during the prolonged ins noted end of the year.   Less dep off the alcohol.    When manic feels more reckless, spending, talk to herself, driven, reduced sleep, irritable and angry.  Past Psychiatric Medication Trials: Depakote NR, carbamazepine questionable side effects, lamotrigine lost response, Trileptal,   lithium , Vraylar 3 SE, Abilify, brief Latuda ? effect,  clozapine, olanzapine , Seroquel 600, risperidone 1 mg twice daily  , Saphris CO anxiety  Gabapentin cog SE,  Trazodone 200, doxepin 30 not sedating,  mirtazapine,  Thorazine 25 mg NR and  activated Lorazepam 1 mg poor response for sleep  several SSRIs, clomipramine had vision side effects,   failed an attempt to switch from Seroquel to Vraylar early 2020  Failed switch from Seroquel to Jordan, Saphris, Vraylar and loxapine No psych hosp.  Review of Systems:  Review of Systems  Cardiovascular:  Negative for chest pain and palpitations.  Gastrointestinal:  Negative for diarrhea.  Genitourinary:  Positive for frequency.  Neurological:  Negative for dizziness, tremors and weakness.  Psychiatric/Behavioral:  Positive for decreased concentration and sleep disturbance. Negative for agitation, behavioral problems, confusion, dysphoric mood, hallucinations, self-injury and suicidal ideas. The patient is not nervous/anxious and is not hyperactive.     Medications: I have reviewed the patient's current medications.  Current Outpatient Medications  Medication Sig Dispense Refill   Acetylcysteine (NAC) 600 MG CAPS Take 1 capsule (600 mg total) by mouth daily. 90 capsule 1   atenolol (TENORMIN) 50 MG tablet Take 25 mg by mouth at bedtime.     B Complex Vitamins (VITAMIN B-COMPLEX) TABS Take 1 tablet by mouth at bedtime.     Calcium Carbonate-Vitamin D (CALCIUM-D PO) Take by  mouth at bedtime.     Cholecalciferol (VITAMIN D3) 5000 units CAPS Take 1 capsule by mouth at bedtime.     cloNIDine (CATAPRES) 0.1 MG tablet 1 in the AM and afternoon and 2 at night 360 tablet 1   ESTRING 7.5 MCG/24HR vaginal ring      fluticasone (FLONASE) 50 MCG/ACT nasal spray Place into the nose.     folic acid (FOLVITE) 400 MCG tablet Take 400 mcg by mouth at bedtime.     levothyroxine (SYNTHROID, LEVOTHROID) 112 MCG tablet Take 112 mcg by mouth at bedtime.     linaclotide (LINZESS) 290 MCG CAPS capsule Take 290 mcg by mouth at bedtime.     lithium carbonate 150 MG capsule TAKE 1 CAPSULE DAILY ;     TAKES THIS WITH 300MG       CAPSULE TO EQUAL 450MG  90 capsule 0   LORazepam (ATIVAN) 0.5 MG tablet TAKE 1 TO  2 TABLETS EVERY 8HOURS AS NEEDED FOR ANXIETY 60 tablet 0   MAGNESIUM PO Take 400 mg by mouth at bedtime.     memantine (NAMENDA) 10 MG tablet TAKE 1 TABLET TWICE A DAY 180 tablet 0   metroNIDAZOLE (FLAGYL) 500 MG tablet Take 1 tablet (500 mg total) by mouth 2 (two) times daily. 14 tablet 0   QUEtiapine (SEROQUEL) 50 MG tablet TAKE 1 TABLET AT BEDTIME (Patient taking differently: Take 200 mg by mouth at bedtime.) 90 tablet 1   tretinoin (RETIN-A) 0.025 % cream Apply topically at bedtime. face     zaleplon (SONATA) 10 MG capsule Take 1 capsule (10 mg total) by mouth at bedtime. 90 capsule 0   temazepam (RESTORIL) 15 MG capsule 2 times in 6 mos (Patient not taking: Reported on 01/29/2024)     No current facility-administered medications for this visit.    Medication Side Effects: None  Allergies: No Known Allergies  Past Medical History:  Diagnosis Date   Anxiety    Bipolar 1 disorder (HCC)    followed by dr c. cottle   Carpal tunnel syndrome on both sides    CKD (chronic kidney disease), stage II    followed by pcp   Endometriosis    Hypothyroidism, postablative    endocrinologist--- dr c. Dewitt Hoes--- dx toxic multinodular thyroid 2000 w/ hyperthyroidism s/p RAI  2000, 2002, and 2004;  post hypothryoid in 2008;   last bx 2012 left side, benign per pt   Irritable bowel syndrome with constipation    followed by dr w. Jason Fila ([facility])   MDD (major depressive disorder)    Multinodular thyroid    in 2000 dx toxic post ablative   NAFLD (nonalcoholic fatty liver disease)    OSA (obstructive sleep apnea)    05-18-2021  per pt study done approx. 2018, told moderat OSA,  does uses cpap   Tachycardia    cardiologist--- Guerry Bruin NP @ [facility] in [place],  w/ palpitations, controlled w/ atenolol;  pt has had work-up per note echo 2014 normal ,  event monitor 2018 showedST w/ mild ST no arrhythmia,  ETT 02/ 2022 normal no ischemia, ef 67%   Wears contact lenses     Family History  Problem Relation Age  of Onset   Thyroid disease Mother    Diabetes Mother    Hypertension Mother     Social History   Socioeconomic History   Marital status: Divorced    Spouse name: Not on file   Number of children: Not on file   Years of  education: Not on file   Highest education level: Not on file  Occupational History   Not on file  Tobacco Use   Smoking status: Former    Current packs/day: 0.00    Types: Cigarettes    Start date: 15    Quit date: 2000    Years since quitting: 25.1   Smokeless tobacco: Never  Vaping Use   Vaping status: Never Used  Substance and Sexual Activity   Alcohol use: Yes    Alcohol/week: 1.0 standard drink of alcohol    Types: 1 Standard drinks or equivalent per week   Drug use: Never   Sexual activity: Not on file  Other Topics Concern   Not on file  Social History Narrative   Not on file   Social Drivers of Health   Financial Resource Strain: Low Risk  (12/19/2023)   Received [facility]   Overall Financial Resource Strain (CARDIA)    Difficulty of Paying Living Expenses: Not very hard  Food Insecurity: No Food Insecurity (12/19/2023)   Received from [facility]   Hunger Vital Sign    Worried About Running Out of Food in the Last Year: Never true    Ran Out of Food in the Last Year: Never true  Transportation Needs: No Transportation Needs (12/19/2023)   Received from [facility] - Transportation    Lack of Transportation (Medical): No    Lack of Transportation (Non-Medical): No  Physical Activity: Insufficiently Active (06/25/2023)   Received [facility]   Exercise Vital Sign    Days of Exercise per Week: 1 day    Minutes of Exercise per Session: 30 min  Stress: No Stress Concern Present (06/25/2023)   Received from [facility] - Occupational Stress Questionnaire    Feeling of Stress : Not at all  Social Connections: Somewhat Isolated (06/25/2023)   Received from [facility]   Social Network    How would you rate your social network (family, work, friends)?: Restricted participation with some degree of social isolation  Intimate Partner Violence: Not At Risk (06/25/2023)   Received from [facility]   HITS    Over the last 12 months how often did your partner physically hurt you?: Never    Over the last 12 months how often did your partner insult you or talk down to you?: Never    Over the last 12 months how often did your partner threaten you with physical harm?: Never    Over the last 12 months how often did your partner scream or curse at you?: Never    Past Medical History, Surgical history, Social history, and Family history were reviewed and updated as appropriate.   Please see review of systems for further details on the patient's review from today.   Objective:   Physical Exam:  There were no vitals taken for this visit.  Physical Exam Neurological:     Mental Status: She is alert and oriented to person, place, and time.     Cranial Nerves: No dysarthria.  Psychiatric:        Attention and Perception: She is inattentive.        Mood and Affect: Mood is anxious. Mood is not depressed.        Speech: Speech normal. Speech is not rapid and pressured or slurred.        Behavior: Behavior is cooperative.  Thought Content: Thought content is not paranoid or delusional. Thought content does not include homicidal or suicidal ideation. Thought content does not include suicidal plan.        Cognition and Memory: She does not exhibit impaired recent memory or impaired remote memory.        Judgment: Judgment normal.     Comments: Insight and judgment appear good. Irritable better with current med regimen No significant pressured speech or flight of ideas     Lab Review:     Component Value Date/Time   NA 136 04/10/2021 1035   K 4.3 04/10/2021 1035   CL 97 04/10/2021 1035   CO2 25 04/10/2021 1035   GLUCOSE 77 04/10/2021 1035   BUN  13 04/10/2021 1035   CREATININE 0.94 04/10/2021 1035   CALCIUM 9.7 04/10/2021 1035       Component Value Date/Time   WBC 6.6 05/24/2021 0630   RBC 3.79 (L) 05/24/2021 0630   HGB 11.3 (L) 05/24/2021 0630   HCT 35.0 (L) 05/24/2021 0630   PLT 325 05/24/2021 0630   MCV 92.3 05/24/2021 0630   MCH 29.8 05/24/2021 0630   MCHC 32.3 05/24/2021 0630   RDW 13.2 05/24/2021 0630    Lithium Lvl  Date Value Ref Range Status  10/10/2022 0.6 0.5 - 1.2 mmol/L Final    Comment:    A concentration of 0.5-0.8 mmol/L is advised for long-term use; concentrations of up to 1.2 mmol/L may be necessary during acute treatment.                                  Detection Limit = 0.1                           <0.1 indicates None Detected   12/05/2021 lithium level 0.6 on 600 mg daily stable  04/10/2021 lithium level 0.8 on 600 mg daily.  No results found for: "PHENYTOIN", "PHENOBARB", "VALPROATE", "CBMZ"   Took lab orders to PCP from visit in September and reports results: B12 >2000 Folate > 20 D 43.5 Iron TIBC 297 (250-450)  UIBC 156 (131-425)  Iron 141 (27-159)  Iron saturation 47 (15-55)   .res Assessment: Plan:    Bipolar II disorder, mild, depressed, with mixed features, in partial remission (HCC)  Generalized anxiety disorder  Mixed obsessional thoughts and acts  MCI (mild cognitive impairment)  Insomnia due to mental condition   30-minute video time with patient was spent . We discussed nature of her psychiatric diagnoses .   She was motivated to get off Seroquel because she believes it caused cognitive SE and fears brain matter loss.  Has reduced to 50 mg which remains required for sleep.  Bipolar disorder Was worse with reduction in Seroquel until addtion of lithium helped and earlier She felt lithium had provided better mood stability than Seroquel. But she complained of dullness on lithium 600 and then became noncompliant and mood sx got worse with irritablity.  She refused to  increase it back to therapeuticlevels,  though we disc at length amiloride might remedy the polyuria.   She remains on 150 mg daily for neurotropic effects.  Had another hypomanic episode end of 2024.    Counseled patient regarding potential benefits, risks, and side effects of lithium to include potential risk of lithium affecting thyroid and renal function.  Discussed need for periodic lab monitoring to determine  drug level and to assess for potential adverse effects.  Counseled patient regarding signs and symptoms of lithium toxicity and advised that they notify office immediately or seek urgent medical attention if experiencing these signs and symptoms.  Patient advised to contact office with any questions or concerns.  Hypomania disc extensively.  there are options like Caplyta. But she doesn't want to increase lithium but agrees to ultral low 150-300 mg daily;  Disc my recommendation for standard care as above but  The only reasonable option would be off label clonidine for irritability though this is not a traditional mood stabilize r and may fail.  Disc SE. This has been helpful but not perfect.  Seroquel 300 mg HS until sometihing else works which also helps sleep.  She agrees. Sleep options Belsomra, Dayvigo, doxepin, Rozerem, hydroxyzine.  Prefer avoid BZ and bc cognitive concerns she has. Wants Sonata rarely.  Benefit some for mood and sleep so continue mirtazapine 30 mg HS for sleep and mood.    Normal CR and CA in 8/23 at [facility] 12/05/2021 lithium level 0.6 on 600 mg daily stable Option amiloride to manage polyuria but she doesn't want to stay on lithium anyway.  If we could get a higher lithium level it might work.  Don't stop meds AMA.   Call if there's a problem.  Sleep hygiene incl no bed without sleep.  She wants to continue memantine 10 BID off label for cognitive problems.   Disc the off label use of it for milder cognitive problems.  She's not sure if it helps orf not.   Remains concerned about cognition. Pending neuropsych testing [facility] Because the cognitive problems resolved with reduction in Seroquel and lithium and the onset of hypomania.  Discussed potential metabolic side effects associated with atypical antipsychotics, as well as potential risk for movement side effects. Advised pt to contact office if movement side effects occur.  Disc her fears of brain matter loss with chronic antipsychotic is likely less risky than poorly managed mood cycling and chronic insomnia.  We discussed the short-term risks associated with benzodiazepines including sedation and increased fall risk among others.  Discussed long-term side effect risk including dependence, potential withdrawal symptoms, and the potential eventual dose-related risk of dementia.  But recent studies from 2020 dispute this association between benzodiazepines and dementia risk. Newer studies in 2020 do not support an association with dementia.  Thyroid was normal recently. B12 and folate are normal D is OK but goal with her should be 50s-60s Disc reasons in detail including Dr. Charm Barges recommendation. Marland Kitchen  gave neuroprotective literature on lithium. Dr. Arlie Solomons article and gave article before.    Clonidine 0.1 mg  1 in the AM and 2 at night off label for manic irritability and mild hypomania.  Disc SE .    Might try Belsomra   FU fEb  Meredith Staggers, MD, DFAPA   Please see After Visit Summary for patient specific instructions.  Future Appointments  Date Time Provider Department Center  02/12/2024  1:00 PM Mendelson, Hulda Humphrey, PhD [facility] None  02/26/2024  8:00 AM Dewayne Hatch, Hulda Humphrey, PhD [facility] None  03/10/2024 10:00 AM Dewayne Hatch, Hulda Humphrey, PhD [facility] None  03/25/2024  8:00 AM Dewayne Hatch, Hulda Humphrey, PhD [facility] None  04/07/2024  8:00 AM Dewayne Hatch Hulda Humphrey, PhD [facility] None  05/05/2024  8:00 AM Dewayne Hatch Hulda Humphrey, PhD [facility] None  05/20/2024  8:00 AM Dewayne Hatch Hulda Humphrey, PhD [facility] None   06/02/2024  8:00 AM Dewayne Hatch, Hulda Humphrey, PhD  [facility] None      No orders of the defined types were placed in this encounter.      -------------------------------

## 2024-02-12 ENCOUNTER — Ambulatory Visit: Payer: Federal, State, Local not specified - PPO | Admitting: Clinical

## 2024-02-12 DIAGNOSIS — F3181 Bipolar II disorder: Secondary | ICD-10-CM
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
## 2024-02-26 ENCOUNTER — Ambulatory Visit (INDEPENDENT_AMBULATORY_CARE_PROVIDER_SITE_OTHER): Payer: Federal, State, Local not specified - PPO | Admitting: Clinical

## 2024-02-26 DIAGNOSIS — F3181 Bipolar II disorder: Secondary | ICD-10-CM
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
## 2024-03-10 ENCOUNTER — Ambulatory Visit (INDEPENDENT_AMBULATORY_CARE_PROVIDER_SITE_OTHER): Payer: Federal, State, Local not specified - PPO | Admitting: Clinical

## 2024-03-10 DIAGNOSIS — F3177 Bipolar disorder, in partial remission, most recent episode mixed: Secondary | ICD-10-CM

## 2024-03-10 DIAGNOSIS — F3181 Bipolar II disorder: Secondary | ICD-10-CM
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
## 2024-03-16 ENCOUNTER — Ambulatory Visit
Admission: EM | Admit: 2024-03-16 | Discharge: 2024-03-16 | Disposition: A | Discharge status: Home / Self Care | Attending: Family Medicine | Admitting: Family Medicine

## 2024-03-16 DIAGNOSIS — N3000 Acute cystitis without hematuria: Secondary | ICD-10-CM

## 2024-03-16 LAB — POCT URINALYSIS DIP (MANUAL ENTRY)
Bilirubin, UA: NEGATIVE
Blood, UA: NEGATIVE
Glucose, UA: NEGATIVE mg/dL
Ketones, POC UA: NEGATIVE mg/dL
Nitrite, UA: POSITIVE — AB
Protein Ur, POC: NEGATIVE mg/dL
Spec Grav, UA: 1.025 (ref 1.010–1.025)
Urobilinogen, UA: 0.2 U/dL
pH, UA: 7 (ref 5.0–8.0)

## 2024-03-16 MED ORDER — CEFDINIR 300 MG PO CAPS
300.0000 mg | ORAL_CAPSULE | Freq: Two times a day (BID) | ORAL | 0 refills | Status: AC
Start: 2024-03-16 — End: 2024-03-23

## 2024-03-16 NOTE — ED Provider Notes (Signed)
 [name]    CSN: [id number] Arrival date & time: 03/16/24  1902      History   Chief Complaint Chief Complaint  Patient presents with   Dysuria    HPI Kristen Leon is a 58 y.o. female.   HPI 58 year old female presents with dysuria and frequency for 2 days.  PMH significant for obesity, IBS, and OSA.  Past Medical History:  Diagnosis Date   Anxiety    Bipolar 1 disorder (HCC)    followed by dr c. cottle   Carpal tunnel syndrome on both sides    CKD (chronic kidney disease), stage II    followed by pcp   Endometriosis    Hypothyroidism, postablative    endocrinologist--- dr c. Dewitt Hoes--- dx toxic multinodular thyroid 2000 w/ hyperthyroidism s/p RAI  2000, 2002, and 2004;  post hypothryoid in 2008;   last bx 2012 left side, benign per pt   Irritable bowel syndrome with constipation    followed by dr w. Jason Fila ([facility])   MDD (major depressive disorder)    Multinodular thyroid    in 2000 dx toxic post ablative   NAFLD (nonalcoholic fatty liver disease)    OSA (obstructive sleep apnea)    05-18-2021  per pt study done approx. 2018, told moderat OSA,  does uses cpap   Tachycardia    cardiologist--- Guerry Bruin NP @ [facility] in [place],  w/ palpitations, controlled w/ atenolol;  pt has had work-up per note echo 2014 normal ,  event monitor 2018 showedST w/ mild ST no arrhythmia,  ETT 02/ 2022 normal no ischemia, ef 67%   Wears contact lenses     Patient Active Problem List   Diagnosis Date Noted   Bipolar 1 disorder, mixed, moderate (HCC) 08/29/2018   OCD (obsessive compulsive disorder) 08/29/2018   MCI (mild cognitive impairment) 08/29/2018    Past Surgical History:  Procedure Laterality Date   COLONOSCOPY  2019   DIAGNOSTIC LAPAROSCOPY  1995   w/ fulgeration endometriosis   DILATATION & CURETTAGE/HYSTEROSCOPY WITH MYOSURE N/A 05/24/2021   Procedure: Melton Krebs WITH ENDOMETRIAL SAMPLING;  Surgeon: Huel Cote, MD;  Location: [facility] [employer] [facility];   Service: Gynecology;  Laterality: N/A;   ESOPHAGOGASTRODUODENOSCOPY  2008   FOOT SURGERY Bilateral 2016   approx---  bilateral 5th toe , removal bone    OB History   No obstetric history on file.      Home Medications    Prior to Admission medications   Medication Sig Start Date End Date Taking? Authorizing Provider  cefdinir (OMNICEF) 300 MG capsule Take 1 capsule (300 mg total) by mouth 2 (two) times daily for 7 days. 03/16/24 03/23/24 Yes [name], [name]  Acetylcysteine (NAC) 600 MG CAPS Take 1 capsule (600 mg total) by mouth daily. 09/13/20   Cottle, Steva Ready., MD  atenolol (TENORMIN) 50 MG tablet Take 25 mg by mouth at bedtime.    [provider]  B Complex Vitamins (VITAMIN B-COMPLEX) TABS Take 1 tablet by mouth at bedtime.    [provider]  Calcium Carbonate-Vitamin D (CALCIUM-D PO) Take by mouth at bedtime.    [provider]  Cholecalciferol (VITAMIN D3) 5000 units CAPS Take 1 capsule by mouth at bedtime.    [provider]  cloNIDine (CATAPRES) 0.1 MG tablet 1 in the AM and afternoon and 2 at night 10/22/23   Cottle, Steva Ready., MD  ESTRING 7.5 MCG/24HR vaginal ring  07/17/22   [provider]  fluticasone (  FLONASE) 50 MCG/ACT nasal spray Place into the nose. 01/31/23 01/31/24  [provider]  folic acid (FOLVITE) 400 MCG tablet Take 400 mcg by mouth at bedtime.    [provider]  levothyroxine (SYNTHROID, LEVOTHROID) 112 MCG tablet Take 112 mcg by mouth at bedtime.    [provider]  linaclotide (LINZESS) 290 MCG CAPS capsule Take 290 mcg by mouth at bedtime. 09/06/17   [provider]  lithium carbonate 150 MG capsule TAKE 1 CAPSULE DAILY ;     TAKES THIS WITH 300MG       CAPSULE TO EQUAL 450MG  01/24/24   Cottle, Steva Ready., MD  LORazepam (ATIVAN) 0.5 MG tablet TAKE 1 TO 2 TABLETS EVERY 8HOURS AS NEEDED FOR ANXIETY 08/07/23   Cottle, Steva Ready., MD  MAGNESIUM PO Take 400 mg by mouth at  bedtime.    [provider]  memantine (NAMENDA) 10 MG tablet TAKE 1 TABLET TWICE A DAY 01/20/24   Cottle, Steva Ready., MD  metroNIDAZOLE (FLAGYL) 500 MG tablet Take 1 tablet (500 mg total) by mouth 2 (two) times daily. 06/21/22   Judyann Munson, MD  QUEtiapine (SEROQUEL) 300 MG tablet Take 1 tablet (300 mg total) by mouth at bedtime. 01/29/24   Cottle, Steva Ready., MD  temazepam (RESTORIL) 15 MG capsule 2 times in 6 mos Patient not taking: Reported on 01/29/2024 01/01/17   [provider]  tretinoin (RETIN-A) 0.025 % cream Apply topically at bedtime. face    [provider]  zaleplon (SONATA) 10 MG capsule Take 1 capsule (10 mg total) by mouth at bedtime. 10/22/23   Cottle, Steva Ready., MD    Family History Family History  Problem Relation Age of Onset   Thyroid disease Mother    Diabetes Mother    Hypertension Mother     Social History Social History   Tobacco Use   Smoking status: Former    Current packs/day: 0.00    Types: Cigarettes    Start date: 1995    Quit date: 2000    Years since quitting: 25.3   Smokeless tobacco: Never  Vaping Use   Vaping status: Never Used  Substance Use Topics   Alcohol use: Yes    Alcohol/week: 1.0 standard drink of alcohol    Types: 1 Standard drinks or equivalent per week   Drug use: Never     Allergies   Patient has no known allergies.   Review of Systems Review of Systems   Physical Exam Triage Vital Signs ED Triage Vitals  Encounter Vitals Group     BP      Systolic BP Percentile      Diastolic BP Percentile      Pulse      Resp      Temp      Temp src      SpO2      Weight      Height      Head Circumference      Peak Flow      Pain Score      Pain Loc      Pain Education      Exclude from Growth Chart    No data found.  Updated Vital Signs BP 123/78   Pulse 69   Temp 98.3 F (36.8 C)   Resp 18   SpO2 98%    Physical Exam Vitals and nursing note reviewed.  Constitutional:       Appearance:  Normal appearance. She is normal weight.  HENT:     Head: Normocephalic and atraumatic.     Mouth/Throat:     Mouth: Mucous membranes are moist.     Pharynx: Oropharynx is clear.  Eyes:     Extraocular Movements: Extraocular movements intact.     Conjunctiva/sclera: Conjunctivae normal.     Pupils: Pupils are equal, round, and reactive to light.  Cardiovascular:     Rate and Rhythm: Normal rate and regular rhythm.     Pulses: Normal pulses.     Heart sounds: Normal heart sounds.  Pulmonary:     Effort: Pulmonary effort is normal.     Breath sounds: Normal breath sounds. No wheezing, rhonchi or rales.  Musculoskeletal:        General: Normal range of motion.     Cervical back: Normal range of motion and neck supple.  Skin:    General: Skin is warm and dry.  Neurological:     General: No focal deficit present.     Mental Status: She is alert and oriented to person, place, and time. Mental status is at baseline.  Psychiatric:        Mood and Affect: Mood normal.        Behavior: Behavior normal.      UC Treatments / Results  Labs (all labs ordered are listed, but only abnormal results are displayed) Labs Reviewed  POCT URINALYSIS DIP (MANUAL ENTRY) - Abnormal; Notable for the following components:      Result Value   Nitrite, UA Positive (*)    Leukocytes, UA Trace (*)    All other components within normal limits  URINE CULTURE    EKG   Radiology No results found.  Procedures Procedures (including critical care time)  Medications Ordered in UC Medications - No data to display  Initial Impression / Assessment and Plan / UC Course  I have reviewed the triage vital signs and the nursing notes.  Pertinent labs & imaging results that were available during my care of the patient were reviewed by me and considered in my medical decision making (see chart for details).     MDM: 1.  Acute cystitis without hematuria-UA revealed above, urine culture  ordered, Rx'd cefdinir 300 mg capsule: Take 1 capsule twice daily x 7 days. Advised patient to take medication as directed with food to completion.  Encouraged to increase daily water intake to 64 ounces per day while taking this medication.  Advised we will follow-up with urine culture results once received.  Advised if symptoms worsen and/or unresolved please follow-up with your PCP or here for further evaluation.  Patient discharged home, hemodynamically stable. Final Clinical Impressions(s) / UC Diagnoses   Final diagnoses:  Acute cystitis without hematuria     Discharge Instructions      Advised patient to take medication as directed with food to completion.  Encouraged to increase daily water intake to 64 ounces per day while taking this medication.  Advised we will follow-up with urine culture results once received.  Advised if symptoms worsen and/or unresolved please follow-up with your PCP or here for further evaluation.     ED Prescriptions     Medication Sig Dispense Auth. Provider   cefdinir (OMNICEF) 300 MG capsule Take 1 capsule (300 mg total) by mouth 2 (two) times daily for 7 days. 14 capsule [name], [name]      PDMP not reviewed this encounter.   [name], [name] 03/16/24 1952

## 2024-03-16 NOTE — Discharge Instructions (Addendum)
 Advised patient to take medication as directed with food to completion.  Encouraged to increase daily water intake to 64 ounces per day while taking this medication.  Advised we will follow-up with urine culture results once received.  Advised if symptoms worsen and/or unresolved please follow-up with your PCP or here for further evaluation.

## 2024-03-16 NOTE — ED Triage Notes (Signed)
 Pt presents to uc with co of dysuria and frequency for a few days.

## 2024-03-19 LAB — URINE CULTURE: Culture: >=100000 — AB

## 2024-03-25 ENCOUNTER — Ambulatory Visit: Payer: Federal, State, Local not specified - PPO | Admitting: Clinical

## 2024-03-25 DIAGNOSIS — F3177 Bipolar disorder, in partial remission, most recent episode mixed: Secondary | ICD-10-CM | POA: Diagnosis not present

## 2024-03-25 DIAGNOSIS — F3181 Bipolar II disorder: Secondary | ICD-10-CM
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
## 2024-03-30 ENCOUNTER — Other Ambulatory Visit: Payer: Self-pay | Admitting: Psychiatry

## 2024-03-30 DIAGNOSIS — G3184 Mild cognitive impairment, so stated: Secondary | ICD-10-CM

## 2024-04-05 ENCOUNTER — Other Ambulatory Visit: Payer: Self-pay | Admitting: Psychiatry

## 2024-04-07 ENCOUNTER — Ambulatory Visit (INDEPENDENT_AMBULATORY_CARE_PROVIDER_SITE_OTHER): Payer: Federal, State, Local not specified - PPO | Admitting: Clinical

## 2024-04-07 DIAGNOSIS — F3181 Bipolar II disorder: Secondary | ICD-10-CM
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
## 2024-04-10 ENCOUNTER — Other Ambulatory Visit: Payer: Self-pay | Admitting: Psychiatry

## 2024-04-10 DIAGNOSIS — F3162 Bipolar disorder, current episode mixed, moderate: Secondary | ICD-10-CM

## 2024-04-10 DIAGNOSIS — F5105 Insomnia due to other mental disorder: Secondary | ICD-10-CM

## 2024-04-15 ENCOUNTER — Encounter: Payer: Self-pay | Admitting: Psychiatry

## 2024-04-15 ENCOUNTER — Telehealth: Admitting: Psychiatry

## 2024-04-15 DIAGNOSIS — G3184 Mild cognitive impairment, so stated: Secondary | ICD-10-CM | POA: Diagnosis not present

## 2024-04-15 DIAGNOSIS — F3181 Bipolar II disorder: Secondary | ICD-10-CM

## 2024-04-15 DIAGNOSIS — F422 Mixed obsessional thoughts and acts: Secondary | ICD-10-CM

## 2024-04-15 DIAGNOSIS — F411 Generalized anxiety disorder: Secondary | ICD-10-CM | POA: Diagnosis not present

## 2024-04-15 DIAGNOSIS — F5105 Insomnia due to other mental disorder: Secondary | ICD-10-CM

## 2024-04-15 NOTE — Progress Notes (Signed)
 Kristen Leon [id number] 1965-12-18 58 y.o.  Video Visit via My Chart  I connected with pt by My Chart video and verified that I am speaking with the correct person using two identifiers.   I discussed the limitations, risks, security and privacy concerns of performing an evaluation and management service by My Chart  and the availability of in person appointments. I also discussed with the patient that there may be a patient responsible charge related to this service. The patient expressed understanding and agreed to proceed.  I discussed the assessment and treatment plan with the patient. The patient was provided an opportunity to ask questions and all were answered. The patient agreed with the plan and demonstrated an understanding of the instructions.   The patient was advised to call back or seek an in-person evaluation if the symptoms worsen or if the condition fails to improve as anticipated.  I provided 30 minutes of video time during this encounter.  The patient was located at home and the provider was located office. Session started [id number]  Subjective:   Patient ID:  Kristen Leon is a 58 y.o. (DOB Mar 08, 1966) female.  Chief Complaint:  Chief Complaint  Patient presents with   Follow-up   Depression   Anxiety     Kristen Leon presents  today for follow-up ofBipolar disorder, generalized anxiety disorder, nocturnal panic attacks, history of OCD, and mild cognitive impairment and worsening driving anxiety.  She has a goal of getting off the Seroquel  in hopes of improving memory.  at visit August 06, 2019.  We needed to start a mood stabilizer and discussed variety of options and decided to start with Latuda .  We started at 20 mg and the plan was to increase gradually to 80 mg daily.  If that was successful this follow-up was going to lead to a potential reduction in an attempt to wean Seroquel . Never got up to 80 mg Latuda .    Did not noticed much of a difference.  visit  September 03, 2019.  The following changes were made: Increase vitamin D from 5000 units daily to 10K units daily Start Latuda   80 mg daily Start reduction in Seroquel  next week and reduce once every 3 weeks. Reduced Seroquel  from 600 to 200 mg HS.  At first had sleep and mood problems and angry.  Added in  gabapentin  300 mg total week ago and sleep better.  Able to sleep on her own.  Mood is better also.  visit October 12, 2019.  The following changes were made: Increase vitamin D from 5000 units daily to 10K units daily Continue Latuda   80 mg daily Continue gradual reduction in Seroquel  next week and reduce once every 3 to 4 weeks by 50 mg. Trying to get rid of gabapentin  and Seroquel  for cognitive reasons primarily  seen December 16, 2019.  The following was noted: Down to Seroquel  150 HS for 3 weeks.  Quality of sleepy slightly off.  How long on this dose?  Tends to wake 30 min earlier than usual.  Bought magnesium.   Feels fine in the day.  Slightly less harried and mentally cloudy with reduction in Seroquel . Likes the Latuda  as mood medication.   Overall mood and anxiety are under control.  Other concern is focus and memory. Wants to get off gabapentin  and Seroquel  for that reason.  Feels the meds may increase risk of dementia.  Overall she has seen some cognitive improvement since the reduction in Seroquel  as noted. She was  in courage to try reducing the Seroquel  further gradually.  She was encouraged to leave the gabapentin  at 300 mg daily because it was helpful.  seen March 14, 2020.  She was not doing well.  The following was noted: Couldn't sleep without Seroquel  200 mg.  Felt unstable and anxious.  For weeks felt she still struggled at 200 mg Seroquel  and raised the dose all the way back up to where she started bc felt desperate and was on edge.   Early 2000's not on Seroquel  and had a lot of anxiety and obsessive thought.  It helps anxiety. Mood better on Seroquel  and feels better  physically and mentally.  A little down with stress and failure of switch to Latuda .  little anger but not much depression and no other sx of mania.    Driving anxiety and sense unreality resolved.  Fine with driving now. The following changes were made: Continue vitamin D from 5000 units daily to 10K units daily Wean Latuda  to 40 mg for a week and then stop it. Then start Saphris  5mg  HS and reduce Seroquel  400 mg at night for 5 days, then increase Saphris  10 mg at night and reduce Seroquel  to 200 mg at night for 5 days, then increase Saphris  to 15 HS and stop sEroquel .  04/12/2020 appointment the following is noted: As noted pt has been wanting off Seroquel  so med changes last visit wre intended to help. More anxious with reduced gabapentin  and Seroquel .  Hard to stop Seroquel  bc both anxiety and insomnia and using 100 mg Seroquel .  Missed a day of work over it yesterday.  More sleep with Saphris  than Latuda .  No SE with saphris . Anxiety included fidgety. Hasn't tried Saphris  15 mg daily yet.   Has felt in a little less foggy headed with reduced Seroquel  and gabapentin .  Down on gabapentin  for a while now and only a little change with that reduction.  A little better focus and memory now. Neuropsych testing next week. Plan: Increase Saphris  15 mg HS and stop Seroquel  again.  Can reduce Seroquel  to 50 or even lower if needed.    05/09/2020 phone call with the following noted: Patient had to go back up to 600 mg Seroquel  at hs, she has tried the last few nights at 500 mg. She's been unsuccessful tapering down and just wanted to have 200 mg and 100 mg tablets available to try and get it lowered.   07/07/2020 appointment with the following noted: Got close to off Seroquel  but felt untethered and trouble with sleep initial and terminal. 5-6 hours on low dose Seroquel . Never tried Saphris  15 mg for reasons that are unclear.  Stopped shortly after the phone session from last time. Started Seroquel  by 2002  and gabapentin  in 2003.  Finds it very difficult to get off the meds and worry over instability. Plan: Start Saphris  10 mg at night and reduce Seroquel  to 400 mg nightly for 4 nights, Then increase Saphris  to 15 mg at night and reduce Seroquel  to 200 mg for 4 nights, Then reduce Seroquel  to 100 mg and continue Saphris  at 15 mg nightly for 4 nights, Then stop Seroquel  and if insomnia occurs, increase Saphris  to 20 mg at night.  07/19/2020 phone call.  Restless legs on Saphris .  Given instructions to change to loxapine  100 mg nightly. 07/25/2020 phone call patient stating she wanted to try to continue Saphris  but have treatment for the restless legs side effects. Prescription sent for ropinirole   09/13/2020 appointment with the following noted: Not taking Saphris .  At 10 mg HS CO jitteriness and anxiety the next day. Never took loxapine . Wonders about taking loxapine  now.  Plan: Give her this change in instructions for transition from Seroquel  to loxapine : Start loxapine  25 mg capsule 1 at night and reduce Seroquel  to 400 mg nightly for 4 nights, Then increase loxapine  to 2 capsules at night and reduce Seroquel  to 200 mg for 4 nights, Then reduce Seroquel  to 100 mg and increase loxapine  to 3 capsules nightly for 4 nights, Then stop Seroquel  and if insomnia occurs, increase loxapine  to 4 capsules at night and notify MD.   Multiple phone calls since the last visit including the following. 10/24/2020:Telephone call to patient.  She experiences an increase in anxiety whenever she reduces the Seroquel .  She is just started the transition from Seroquel  to loxapine .  She is tolerating the loxapine  well.  Encouraged her to just use the lorazepam  and transition from Seroquel  to loxapine .  It is hoped and expected that the loxapine  will effectively manage anxiety as the Seroquel  when she is on the full dose.  She agrees to the plan.  Lorazepam  prescription sent in. 11/17/2020: RTC  See prior note.  Took 50  mg Seroquel  last night.  Realized she made a mistake in dosing.  She stopped from 200 mg Seroquel  instead of reducing to 100 mg as instructed.  Had gotten up to 4 loxapine  without seroquel  was jittery and like she'd come out of her skin and took lorazepam .  Still some problems with memory.  Last night took 50 mg Seroquel  and was a little better. Much better today than yesterday.   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   Memory testing with visuospatial problems and was worked up over that.   Take 4 loxapine  and start 75 mg Seroquel  and taper from there more slowly. Appt 11/22/20  11/22/2020:Pt requesting Rx for Loxapine  25 mg 4/d  30 day and Seroquel  200 mg 3/d   30 day and Lorazepam  0.5 mg 1-2 tab every 8 hrs PRN for anxiety  @ [facility] [employer] on file. Apt RS from 12/21 to 2/9 and on canc list. Pt going out of town 12/27-1/3 asking for Rx before 12/27.  11/22/2020 MD response: I sent in the prescription for lorazepam .  Patient is not on quetiapine  200 mg 3 daily and I will not prescribe that dose.  Ask her what dose of quetiapine  she is actually taking.  Also the insurance will not cover 4 loxapine  capsules daily.  If she is taking 4 I will need to change it to 2 of the 50 mg capsules.  Please verify her current dose of loxapine  and quetiapine .  I am weaning her off of quetiapine  she should not be on that high of a dose of quetiapine . 11/23/2020:Pt called back stating she would like to stop Loxapine  completely. She is having blurred vision and jittery. She did not pick up Loxapine  Rx. She is currently taking quetiapine  100 mg (4-25 mg tabs daily). Requesting to go back to 600 mg daily. Requesting Rx for this.  Agreed 12/01/2020:Clair called to request that you decrease her gabapentin  and prescribe Buspar .  Please call to discuss this.  appt 01/11/21.  MD response: Took buspar  years ago and wants to try it again and reduce gabapentin .  Currently on  1200 mg HS.  Reduce to 900 mg gabapentin .   Start buspirone  5  mg twice daily, and increase to 15 mg BID. Disc SE.  She agrees.  01/11/2021 appointment with the following noted: Completely off gabapentin  for a couple of weeks maybe.  Initially anxiety a little high in AM.  Increased buspirone . Had increase anxiety when off Seroquel .  Was off about 4 days and memory was not better.  Anxiety is better now. Anxiety is a little higher off gabapentin  but not unmanageable.  Sleep is not as good off the gabapentin .  More initial insomnia and takes melatonin to manage. Not marked difference with cognition off gabapentin . Read about antipsychotics reducing brain volume and still wants to stop Seroquel  despite multiple failed attempts. Wants to switch from Seroquel  to trazodone .  Plan: OK trial Seroquel  reduction to 400 mg daily with no other med changes.  02/17/2021 appointment with the following noted: Stopped buspirone  without change. Stopped gabapentin . Reduced Seroquel  to 400 mg HS. She is convinced antipsychotic is causing brain loss.  She wants to be off antipsychotics bc of this concern and risk of dementia.  Don't think fears of dementia are unfounded. Doesn't believe she has psychotic sx. Wants to retry lithium  and then trazodone  in place of Seroquel .  Took lithium  a long time.   Acknowledges prior failures weaning Seroquel  multiple times but thinks maybe it was too fast.  Sleep most of the night and sleep less deep than 600 mg Seroquel .  Also less deep without gabapentin .  Altogether 6-7 hours but leaves the TV on.   In bed 8 watching TV and doesn't try to sleep until 1030. Plan: She  failed attempts to get off Seroquel  multiple times. OK trial Seroquel  reduction to 300 mg daily for 3-4 weeks, then 200 mg for 3-4 weeks. Disc withdrawal effects from Seroquel . Start lithium  300 mg nightly for a week then 600 mg nightly. Wait a week and get blood level.    04/12/2021 appointment with the  following noted: Did get down to Seroquel  200 mg HS and up to lithium  600 HS and trazodone  100 mg HS. Sleep more interrupted but 6-7 hours with poor quality and less well rested with change.  Mood really good with lithium  and maybe better. Often had rapid cycling and it seemed to stop. But "sleep sucks" and feels it affects her during the day Still takes mirtazapine  15 mg and added trazodone .  Sleep worse without trazodone . No SE with lithium . Cognition  more clear with better focus with less Seroquel .   Patient reports stable mood and denies depressed or irritable moods.  Patient denies any recent difficulty with anxiety, surprisingly. Denies appetite disturbance.  Patient reports that energy and motivation have been good.  Patient denies any difficulty with concentration.  Patient denies any suicidal ideation.  Plan: Continue Seroquel   200 mg for 3-4 weeks. Continue lithium  600 mg nightly. Check another level  Stop mirtazapine  and trazodone  Doxepin  10-30 mg HS   04/24/2021 phone call complaining of obsessive thinking and wanting medicine sent into the pharmacy. MD response: Her case is very complicated because she has complained of side effects from atypicals and worried about cognitive side effects of benzodiazepines.  She is bipolar and therefore we cannot use SSRIs.  She does not want to take atypical antipsychotics.  She is on a low dose of Seroquel  now and I am sure does not want to increase the dosage back up.  She has a as needed prescription for lorazepam  she can use that.  I will not make any other major med changes over  the phone outside of a scheduled appointment.  If the symptoms are bothersome enough however going to the cancellation list and we will attempt to get her into an appointment sooner.  06/01/2021 appointment with the following noted: Thinks she stopped trazodone  and ? Took doxepin .  She vaguely think she tried it and it didn't help sleep. Mood more stable on lithium  600  HS.  Prior mood cycling into dep and mania have resolved on it.. Still problems with her sleep. Increased Seroquel  to 400 mg on her own for sleep added gabapentin  600 and sleep is better but wants off both of them. Still on mirtazapine  30.   Cholecystectomy scheduled for August. Had stopped Seroquel  and then had intrusive thoughts which have now resolved.  At the time she called they were bad. Plan: Continue lithium  600 mg nightly. Check another level before next appt Wants to try again to taper quetiapine , will proceed as follows: Stop mirtazapine  DT NR Retry Doxepin  at higher dose of 50 mg up to 100 mg HS Once sleeping well then try to taper Seroquel  very slowly such as reduce by 50 mg every 2 to 3 weeks or even slower if necessary.  09/05/2021 appointment with the following noted: Got down to Seroquel  200 and taking gabapentin  600 mg HS. Never tried 50-100 mg for sleep. Off track for 6 weeks without enough sleep.  Cholecystectomy recently. On phone with BF too much at night talking to him bc of his work schedule.   More irritable and tired.   Older cousin moved in for 3 mos and stressed by that and esp bc he brought a dog.  He's taking advantage of her and brought his GF. Stopped lithium  AMA bc "I was afraid of it".   I want to go back on lithium  bc it helped and retry doxepin  at higher dose and try wean Seroquel .  Then wean gabapenin.   Was more focused and better mood stability on lithium . Mirtazapine  does not help sleep much. Paranoid about being hurt if she dates and no history of it.  Develop a whole story about what might happen. Plan: Stop mirtazapine  DT NR Retry Doxepin  at higher dose of 50 mg up to 100 mg HS Once sleeping well then try to taper Seroquel  very slowly such as reduce by 50 mg every 2 to 3 weeks or even slower if necessary. Restart lithium  600 mg nightly. Check another level before next appt Don't stop meds AMA.   Call if there's a problem.  11/16/21 appt  noted: Second week of Seroquel  150 mg daily. Is sleeping currently but not the same quality but not terrible.  On doxepin  75 mg HS Mood is good.  Likes the lithium  better than Seroquel  for mood.  Fewer hypomanic episones and more stable with less anger. Never got lithium  level.  Says she'll do so. Rare lorazepam . No SE right now. Asks about Ozempic. Plan: continue Doxepin  at higher dose of 50 mg up to 100 mg HS Once sleeping well then try to taper Seroquel  very slowly such as reduce by 50 mg every 2 to 3 weeks or even slower if necessary. Continue lithium  600 mg nightly. Get lithium  level ASAP.  Then repeat lithium  level with every 20 # of weight loss Don't stop meds AMA.   Call if there's a problem.  01/18/2022 appointment with the following noted: Ran out of doxepin  and says refill rejected for 3 weeks. Was feeling kind of down. Down to Seroquel  25 mg HS for  2 + weeks. Still sleeping with some awakening.  Surprising good sleep.  6 and 1/2 hours of sleep and not drowsy daytime. Mood is fine.   Not sure if doxepin  helped sleep but would like to have it. Patient reports stable mood and denies depressed or irritable moods.  Patient denies any recent difficulty with anxiety.  Patient denies difficulty with sleep initiation or maintenance. Denies appetite disturbance.  Patient reports that energy and motivation have been good.  Patient denies any difficulty with concentration.  Patient denies any suicidal ideation. Plan no med chages  04/17/22 appt noted: Takes gabapentin  prn sleep and it helps. Felt dull on lithium  and U frequency so reduced it to 1of 300 mg  lithium  daily. Down to Seroquel  25 mg HS and Sonata  on occasion. Feels less dull with less lithium .  Thinks she was more depressed on lihtium at 600 mg daily.  Feels better the last 5 days.  Less urinary frequency. Sleep is less deep with less Seroquel  and shorter duration 6 hours.  It's not terrrible but not great. 100 times better  cognitively with less Seroquel .   Noticed it at work. Anxiety in check and she's surprised. Doesn't want med changes today. Plan; Cogniton better with less serotquel 25 mg HS. She wants to try lorazepam  0.5 mg HS in place of Sonata  to see if she can sleep longer. Continue lithium  600 mg nightly was recommended but she dropped it to 300 mg nightly because she complained of dullness at the 600 mg and perhaps even some depression.  We agreed on a compromise of 450 mg nightly.  Even that will be below the usual therapeutic range and there is significant risk of mood instability and relapse with this and she accepts that risk.   07/04/22 appt noted: Using lorazepam  about every 3 mos. Taking Sonata  more about 1/2 the time. Taking quetiapine  25 mg nightly and lithium  450 mg daily. Don't think meds working out for her.  Not taking lithium  enough.  More hypomania and sleep not enough nor restorative. Hypomania includes talking to herself a lot and tell funny stories to herself. Excitable mood and speech. More energy and need to burn it off.  Therapist noticed.   EMA.  About 5 hours sleep since off more Seroquel . On more lithium  had less hypomania but felt flat on more lithium .  Still frequent urination.   Saw a urologist. Ativan  not helpful sleep Plan: Cogniton complaints resolved with less serotquel 25 mg HS. But not sleeping.   Option switch to Thorazine  25 mg for less hangover.  She will call in a couple of weeks and if sleep is not better we will switch to the Thorazine .  She understands this is not an antipsychotic at these low dosages but it can have other side effects like dizziness or drowsiness. Increase lithium  to 450/600 mg QOD bc hypomania at 450 daily and dullness at 600 mg daily. Check lithium  level in 2 weks.   09/07/22 NS  10/19/2022 appointment noted: Chlorpromazine  made her feel wired and only taken once or twice and stopped. Still trouble with sleep unless takes more Seroquel .   Seroquel  helps her go to sleep but not stay asleep. Thinks lithium  is dulling and more periods of hypomania.  Wants to stay on lithium  bc doesn't want to take antipsychotic for mood stabilizer.  Don't feel as connected to people on it bc feels flat.  Hard to feel emotional about things.  On Seroquel  felt like personality was more normal.  Cognition definitely better off the Seroquel . Sonata  about once every 3 weeks.    Avg 5 hours of sleep nightly without napping.  Not drowsy but is tired during the day.  Can't sleep withoout meds. Has been having a lot of hypomania but no one else is complaining.  More than I usually have and can notice it at work.  More chatty and more inclined to be outspoken and borderline inappopriate.  No anger problems.   Not depressed much ever.   Gall bladder removed last year and GI Sx ongoing with ongoing workup.  GI problems predate lithium  she thinks.  02/19/23 appt noted:  Switch lithium  in AM 2 weeks ago and less compliant.  Mood less stable and more irritable.  CO polyuria and nocturia. Sleep impacted by not usually taking quetiapine .  Sleep irregular 5-6 hours.  Recognizes she can be rude to her kids.. I'm not open to an antipsychotic.   Only taking lithium  about 300 mg 3 days in last week. Taking ativan  helped her feel much better.  She wants to take one of the 0.5 mg in the AM to calm her irritability. Plan discussed off-label clonidine  for bipolar irritability and potential antimanic effect.  We did this given her reluctance to take alternatives and failure of multiple options noted below  05/22/23 appt noted: Meds: clonidine  0.1 mg BID, Namenda  , lithium  150 mg daily, Seroquel  25 mg HS and occ extra. Sonata  prn for sleep. Seroquel  helps her fall asleep.  Recently hypomania is different with clonidine .  Seeing it more often and lasts longer and wonders about taking more lithium  when it happens.  Not happened in a while. First noted with subtherapeutic dose  lithium  when coming off of it.  Saying things she wouldn't normally say.  Clonidine  helped but didn't stop it from happening.  Hypomania can last up to a week but feels more normal now.   When took steroid was hypomanic.  Still on NAC, levoxyl, vit D.  Taking atenolol also. Not particularly dep.  Anxiety manageable.  Not hypomanic now.  Feels more unrestrained in conversations and does concern her.  She feels her brain is clearer off the mood stabilizers.  Better in conversation.  Feels better emotionally bc feels more emotionally available to people than before.. Not currently irritable with clonidine , it helps a good bit.   Monitors BP and it is normal.  Saw card who said is ok to take clonidine .  Normal pulse.   Likes the clonidine  as an off label mood stabilizer with benefit for hypomania and resolved irritablity. Plan: Clonidine  0.1 mg increase to 1 in the AM and 2 at night off label for manic irritability and mild hypomania.   08/22/23 appt noted: Psych med: NAC, clonidine  0.1 mg BID and 0.2 mg HS, quetiapine  50-75 mg HS, lithium  150 mg daily. Needed increase clonidine  and card ok'd that.  Really liked clonidine  initially but now jury is still out.   Don't generally have depressive sx but had been feeling down.  Also sometimes feels like she might explode with anger.   Sleep is not good with initial and terminal. Awakens 330 and can't go back to sleep.   No sig paranoia but admits to feeling unsafe in public.  Something unexpected could happen  to she or her kids.  Don't feel that safe in grocery store. But not avoiding necessary shopping.  Partly bc Cousin killed by train 6 mos ago. Asks about trying Remeron  for sleep Cog function much  better and thinks being off high dose quetiapine  has helped.  Very happy in that regard.  10/22/23 appt note: Psych med: NAC, clonidine  0.1 mg BID and 0.2 mg HS, quetiapine  50-75 mg HS, lithium  150 mg daily, added mirtazapine  30 mg HS, sonata  prn EMA,  The  best I've ever been.  Better mood and sleep with mirtazapine .  Clonidine  helped.  Not as impulsive, better self control.  Feels sharper with less Seroquel . Sleep can still be erratic.  Nocturia wakes her.   Occ takes more Neurontin  400 with 200 mg Seroquel  when needs a good night sleep about once monthly.  Biggest change with clonidine  and mirtazapine .   Tolerating meds.   Not jittery nor lightheaded.   Plan: no changes  01/29/24 appt noted: Med: no NAC out, clonidine  0.1 mg BID and 0.2 mg HS, quetiapine   200 mg HS, lithium  150 mg daily, stopped mirtazapine  30 mg HS, sonata  rare prn EMA, Memantine  10 BID Sleep very disturbed.  Went to neuro, will be doing more memory testing.  Worry over lack of sleep affecting brain.  Neuro rec increase quetiapine  bc less risk from it than chronic insomnia.  She increased from 50 to 200 mg HS. Stopped mirtazapine  bc felt better and didn't think she needed it.  Sleep not worse off it.  Getting about 6 hours now and feels better about it than before increase quetiapine . Works for [employer]. And supervises a number of attorneys.  For a couple of mos was working 18 hours per day and doesn't feel she ever recovered.  More normal schedule in early Jan.  Was getting 3 hours sleep at the time and didn't feel she needed sleep at the time.   Continued concerns about chronic brain loss caused by Seroquel  in her opinion. She recognizes was a bit impulsive during the prolonged ins noted end of the year.   Less dep off the alcohol.     04/15/24 appt noted:  Med: clonidine  0.1 mg BID and 0.2 mg HS, quetiapine   350 mg HS, lithium  150 mg daily,  sonata  rare prn EMA, Memantine  10 BID Recently did more memory testing and had brain scan. Memory testing showed improvement in several areas.  Mild executive dysfunction.   More clear with less meds overall.  Sleep got worse until increased quetiapine .  Feels better about sleep Mood pretty good but some irritability that feels  physical like needs physical release for anger.  Worked on relaxation exercises. Stress with D with Down's syndrome.  Thinks clonidine  helped some with irritabilty stress and anxiety.   Feels better with overall mood balance and sleep with current med combo of Seroquel  and clonidine .   Wants to stop memeantine. Doubts it helps and neuro said probably doesn't help.  When manic feels more reckless, spending, talk to herself, driven, reduced sleep, irritable and angry.  Past Psychiatric Medication Trials: Depakote NR, carbamazepine questionable side effects, lamotrigine lost response, Trileptal,   lithium  , Vraylar  3 SE, Abilify, brief Latuda  ? effect,  clozapine, olanzapine , Seroquel  600, risperidone 1 mg twice daily  , Saphris  CO anxiety  Gabapentin  cog SE,  Trazodone  200, doxepin  30 not sedating,  mirtazapine ,  Thorazine  25 mg NR and activated Lorazepam  1 mg poor response for sleep  several SSRIs, clomipramine had vision side effects,   failed an attempt to switch from Seroquel  to Vraylar  early 2020  Failed switch from Seroquel  to Latuda , Saphris , Vraylar  and loxapine  No psych hosp.  Review of Systems:  Review  of Systems  Cardiovascular:  Negative for chest pain and palpitations.  Gastrointestinal:  Negative for diarrhea.  Genitourinary:  Positive for frequency.  Neurological:  Negative for dizziness, tremors and weakness.  Psychiatric/Behavioral:  Positive for decreased concentration and sleep disturbance. Negative for agitation, behavioral problems, confusion, dysphoric mood, hallucinations, self-injury and suicidal ideas. The patient is not nervous/anxious and is not hyperactive.     Medications: I have reviewed the patient's current medications.  Current Outpatient Medications  Medication Sig Dispense Refill   Acetylcysteine (NAC) 600 MG CAPS Take 1 capsule (600 mg total) by mouth daily. 90 capsule 1   atenolol (TENORMIN) 50 MG tablet Take 25 mg by mouth at bedtime.      B Complex Vitamins (VITAMIN B-COMPLEX) TABS Take 1 tablet by mouth at bedtime.     Calcium Carbonate-Vitamin D (CALCIUM-D PO) Take by mouth at bedtime.     Cholecalciferol (VITAMIN D3) 5000 units CAPS Take 1 capsule by mouth at bedtime.     cloNIDine  (CATAPRES ) 0.1 MG tablet 1 in the AM and afternoon and 2 at night 360 tablet 1   ESTRING 7.5 MCG/24HR vaginal ring      folic acid (FOLVITE) 400 MCG tablet Take 400 mcg by mouth at bedtime.     levothyroxine (SYNTHROID, LEVOTHROID) 112 MCG tablet Take 112 mcg by mouth at bedtime.     linaclotide (LINZESS) 290 MCG CAPS capsule Take 290 mcg by mouth at bedtime.     lithium  carbonate 150 MG capsule TAKE 1 CAPSULE DAILY; TAKE THIS WITH 300MG  CAPSULE TO EQUAL 450MG  (Patient taking differently: Take 150 mg by mouth daily at 12 noon.) 90 capsule 0   LORazepam  (ATIVAN ) 0.5 MG tablet TAKE 1 TO 2 TABLETS EVERY 8HOURS AS NEEDED FOR ANXIETY 60 tablet 0   MAGNESIUM PO Take 400 mg by mouth at bedtime.     metroNIDAZOLE  (FLAGYL ) 500 MG tablet Take 1 tablet (500 mg total) by mouth 2 (two) times daily. 14 tablet 0   QUEtiapine  (SEROQUEL ) 300 MG tablet TAKE 1 TABLET AT BEDTIME (Patient taking differently: Take 350 mg by mouth at bedtime.) 90 tablet 0   tretinoin (RETIN-A) 0.025 % cream Apply topically at bedtime. face     zaleplon  (SONATA ) 10 MG capsule Take 1 capsule (10 mg total) by mouth at bedtime. 90 capsule 0   fluticasone (FLONASE) 50 MCG/ACT nasal spray Place into the nose.     temazepam (RESTORIL) 15 MG capsule 2 times in 6 mos (Patient not taking: Reported on 04/15/2024)     No current facility-administered medications for this visit.    Medication Side Effects: None  Allergies: No Known Allergies  Past Medical History:  Diagnosis Date   Anxiety    Bipolar 1 disorder (HCC)    followed by dr c. cottle   Carpal tunnel syndrome on both sides    CKD (chronic kidney disease), stage II    followed by pcp   Endometriosis    Hypothyroidism, postablative     endocrinologist--- dr c. Marga Share--- dx toxic multinodular thyroid 2000 w/ hyperthyroidism s/p RAI  2000, 2002, and 2004;  post hypothryoid in 2008;   last bx 2012 left side, benign per pt   Irritable bowel syndrome with constipation    followed by dr w. Algie Antis (gi)   MDD (major depressive disorder)    Multinodular thyroid    in 2000 dx toxic post ablative   NAFLD (nonalcoholic fatty liver disease)    OSA (obstructive sleep apnea)  05-18-2021  per pt study done approx. 2018, told moderat OSA,  does uses cpap   Tachycardia    cardiologist--- Francesca Ink NP @ [facility] in [place],  w/ palpitations, controlled w/ atenolol;  pt has had work-up per note echo 2014 normal ,  event monitor 2018 showedST w/ mild ST no arrhythmia,  ETT 02/ 2022 normal no ischemia, ef 67%   Wears contact lenses     Family History  Problem Relation Age of Onset   Thyroid disease Mother    Diabetes Mother    Hypertension Mother     Social History   Socioeconomic History   Marital status: Divorced    Spouse name: Not on file   Number of children: Not on file   Years of education: Not on file   Highest education level: Not on file  Occupational History   Not on file  Tobacco Use   Smoking status: Former    Current packs/day: 0.00    Types: Cigarettes    Start date: 29    Quit date: 2000    Years since quitting: 25.3   Smokeless tobacco: Never  Vaping Use   Vaping status: Never Used  Substance and Sexual Activity   Alcohol use: Yes    Alcohol/week: 1.0 standard drink of alcohol    Types: 1 Standard drinks or equivalent per week   Drug use: Never   Sexual activity: Not on file  Other Topics Concern   Not on file  Social History Narrative   Not on file   Social Drivers of Health   Financial Resource Strain: Low Risk  (04/07/2024)   Received from [facility]   Overall Financial Resource Strain (CARDIA)    Difficulty of Paying Living Expenses: Not hard at all  Food Insecurity: No Food Insecurity  (04/07/2024)   Received from [facility]   Hunger Vital Sign    Worried About Running Out of Food in the Last Year: Never true    Ran Out of Food in the Last Year: Never true  Transportation Needs: No Transportation Needs (04/07/2024)   Received from [facility] - Transportation    Lack of Transportation (Medical): No    Lack of Transportation (Non-Medical): No  Physical Activity: Insufficiently Active (04/07/2024)   Received from [facility]   Exercise Vital Sign    Days of Exercise per Week: 2 days    Minutes of Exercise per Session: 40 min  Stress: No Stress Concern Present (04/07/2024)   Received from [facility] - Occupational Stress Questionnaire    Feeling of Stress : Only a little  Social Connections: Somewhat Isolated (04/07/2024)   Received from [facility]   Social Network    How would you rate your social network (family, work, friends)?: Restricted participation with some degree of social isolation  Intimate Partner Violence: Not At Risk (04/07/2024)   Received from [facility]   HITS    Over the last 12 months how often did your partner physically hurt you?: Never    Over the last 12 months how often did your partner insult you or talk down to you?: Never    Over the last 12 months how often did your partner threaten you with physical harm?: Never    Over the last 12 months how often did your partner scream or curse at you?: Never    Past Medical History, Surgical history, Social history, and Family history were  reviewed and updated as appropriate.   Please see review of systems for further details on the patient's review from today.   Objective:   Physical Exam:  There were no vitals taken for this visit.  Physical Exam Neurological:     Mental Status: She is alert and oriented to person, place, and time.     Cranial Nerves: No dysarthria.  Psychiatric:        Attention and Perception: She is  inattentive.        Mood and Affect: Mood is anxious. Mood is not depressed.        Speech: Speech normal. Speech is not rapid and pressured or slurred.        Behavior: Behavior is cooperative.        Thought Content: Thought content is not paranoid or delusional. Thought content does not include homicidal or suicidal ideation. Thought content does not include suicidal plan.        Cognition and Memory: She does not exhibit impaired recent memory or impaired remote memory.        Judgment: Judgment normal.     Comments: Insight and judgment appear good. Irritable better with current med regimen No significant pressured speech or flight of ideas     Lab Review:     Component Value Date/Time   NA 136 04/10/2021 1035   K 4.3 04/10/2021 1035   CL 97 04/10/2021 1035   CO2 25 04/10/2021 1035   GLUCOSE 77 04/10/2021 1035   BUN 13 04/10/2021 1035   CREATININE 0.94 04/10/2021 1035   CALCIUM 9.7 04/10/2021 1035       Component Value Date/Time   WBC 6.6 05/24/2021 0630   RBC 3.79 (L) 05/24/2021 0630   HGB 11.3 (L) 05/24/2021 0630   HCT 35.0 (L) 05/24/2021 0630   PLT 325 05/24/2021 0630   MCV 92.3 05/24/2021 0630   MCH 29.8 05/24/2021 0630   MCHC 32.3 05/24/2021 0630   RDW 13.2 05/24/2021 0630    Lithium  Lvl  Date Value Ref Range Status  10/10/2022 0.6 0.5 - 1.2 mmol/L Final    Comment:    A concentration of 0.5-0.8 mmol/L is advised for long-term use; concentrations of up to 1.2 mmol/L may be necessary during acute treatment.                                  Detection Limit = 0.1                           <0.1 indicates None Detected   12/05/2021 lithium  level 0.6 on 600 mg daily stable  04/10/2021 lithium  level 0.8 on 600 mg daily.  No results found for: "PHENYTOIN", "PHENOBARB", "VALPROATE", "CBMZ"   Took lab orders to PCP from visit in September and reports results: B12 >2000 Folate > 20 D 43.5 Iron TIBC 297 (250-450)  UIBC 156 (131-425)  Iron 141 (27-159)  Iron  saturation 47 (15-55)   .res Assessment: Plan:    Bipolar II disorder, mild, depressed, with mixed features, in partial remission (HCC)  Generalized anxiety disorder  Mixed obsessional thoughts and acts  MCI (mild cognitive impairment)  Insomnia due to mental condition   30-minute video time with patient was spent . We discussed nature of her psychiatric diagnoses .   She was motivated to get off Seroquel  because she believes it caused cognitive SE and  fears brain matter loss.   Had reduced to 50 mg but mood and sleep got worse and has resumed a more therapeutic dos.   Bipolar disorder Was worse with reduction in Seroquel  until addtion of lithium  helped and earlier She felt lithium  had provided better mood stability than Seroquel . But she complained of dullness on lithium  600 and then became noncompliant and mood sx got worse with irritablity.  She refused to increase it back to therapeutic levels,  though we disc at length amiloride might remedy the polyuria.   She remains on 150 mg daily for neurotropic effects.  Had another hypomanic episode end of 2024.    Counseled patient regarding potential benefits, risks, and side effects of lithium  to include potential risk of lithium  affecting thyroid and renal function.  Discussed need for periodic lab monitoring to determine drug level and to assess for potential adverse effects.  Counseled patient regarding signs and symptoms of lithium  toxicity and advised that they notify office immediately or seek urgent medical attention if experiencing these signs and symptoms.  Patient advised to contact office with any questions or concerns.  Hypomania disc before.  there are options like Caplyta. But she doesn't want to increase lithium  but agrees to ultral low 150 mg daily;  Disc my recommendation for standard care as above but  The only reasonable option would be off label clonidine  for irritability though this is not a traditional mood stabilize  r and may fail.  Disc SE. This has been helpful but not perfect.  Continue Seroquel  350 mg HS until sometihing else works which also helps sleep.  She agrees. Sleep options Belsomra, Dayvigo, doxepin , Rozerem, hydroxyzine.  Prefer avoid BZ and bc cognitive concerns she has. Wants Sonata  rarely and lorazepam  even more rarely.  Don't stop meds AMA.   Call if there's a problem.  Sleep hygiene incl no bed without sleep.  She wants to stop memantine  10 BID off label for cognitive problems bc neuro said it's probably not helping.   Disc the off label use of it for milder cognitive problems.   Rec pay attention to be sur e cognition doesn' t worsen off this bc it does sometimes help mild cog px.    She was greatly reassured by improved cognitive function testing done March 11, 2024 at [facility] neurology.  She is less fearful about her cognitive functioning then she was in the past.  Overall she is satisfied with current med regimen except as noted wanting to stop memantine .  Discussed potential metabolic side effects associated with atypical antipsychotics, as well as potential risk for movement side effects. Advised pt to contact office if movement side effects occur.  Disc her fears of brain matter loss with chronic antipsychotic is likely less risky than poorly managed mood cycling and chronic insomnia.  We discussed the short-term risks associated with benzodiazepines including sedation and increased fall risk among others.  Discussed long-term side effect risk including dependence, potential withdrawal symptoms, and the potential eventual dose-related risk of dementia.  But recent studies from 2020 dispute this association between benzodiazepines and dementia risk. Newer studies in 2020 do not support an association with dementia.  Thyroid was normal recently. B12 and folate are normal D is OK but goal with her should be 50s-60s Disc reasons in detail including Dr. Mariane Shire recommendation.  .  Previously gave neuroprotective literature on lithium . Dr. Rochell Chroman article and gave article before.   Disc in depth value of sleep for protecting brain function and  esp recent studies in last couple of years.  Did not sleep much with less seroquel  and mood was worse.  Clonidine  0.1 mg  1 in the AM and 2 at night off label for manic irritability and mild hypomania.  Disc SE .    Medication plan: Continue lithium  150 nightly, continue quetiapine  350 nightly, continue clonidine  0.1 mg twice daily and 0.2 mg nightly, she will stop memantine .  Might try Belsomra   FU fEb  Nori Beat, MD, DFAPA   Please see After Visit Summary for patient specific instructions.  Future Appointments  Date Time Provider Department Center  05/05/2024  8:00 AM Shirlean Doss, Jenna L, PhD [facility] None  05/20/2024  8:00 AM Shirlean Doss, Jenna L, PhD [facility] None  06/02/2024  8:00 AM Shirlean Doss, Jenna L, PhD [facility] None  06/15/2024  1:00 PM Shirlean Doss, Jenna L, PhD [facility] None  06/30/2024  8:00 AM Shirlean Doss, Jenna L, PhD [facility] None  07/28/2024  8:00 AM Shirlean Doss, Jenna L, PhD [facility] None  08/12/2024  8:00 AM Mendelson, Jenna L, PhD [facility] None      No orders of the defined types were placed in this encounter.      -------------------------------

## 2024-04-22 ENCOUNTER — Ambulatory Visit: Payer: Federal, State, Local not specified - PPO | Admitting: Clinical

## 2024-05-05 ENCOUNTER — Ambulatory Visit (INDEPENDENT_AMBULATORY_CARE_PROVIDER_SITE_OTHER): Payer: Federal, State, Local not specified - PPO | Admitting: Clinical

## 2024-05-05 DIAGNOSIS — F3181 Bipolar II disorder: Secondary | ICD-10-CM
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
## 2024-05-08 ENCOUNTER — Telehealth: Payer: Self-pay | Admitting: Psychiatry

## 2024-05-08 NOTE — Telephone Encounter (Signed)
 Pt last seen 5/14: Medication plan: Continue lithium  150 nightly, continue quetiapine  350 nightly, continue clonidine  0.1 mg twice daily and 0.2 mg nightly, she will stop memantine .   She is reporting a lot of intrusive thoughts, but no active plan. Feels like she just doesn't want to be here. Said there is a lot of stress at work. She has trouble sleeping, both getting to sleep and staying asleep, sleeping approximately 5 hours. Rates anxiety as 7/10, depression 2-3/10. No A/VH. Able to complete ADLs.  Lots of sadness. She was tearful. FU 8/19.

## 2024-05-08 NOTE — Telephone Encounter (Signed)
 Put her on cancellation list.    I don't want to start something new without seeing her.  She historically does not like taking psych meds.   So we can either increase the lithium  to a therapeutic level of 600 mg daily or increase the quetiapine  which would likely help and specifically help sleep quicker.  She can increase it to 400 mg HS. Based on notes from 2022 she told me she liked lithium  for mood better than Seroquel .  But she can choose either of the options until I see her. Nori Beat, MD, DFAPA

## 2024-05-08 NOTE — Telephone Encounter (Signed)
 Patient lvm today at 11:11 stating that she is really struggling. She request to speak with Dr Toi Foster or someone to discuss the issue.  Appointment scheduled 07/21/24

## 2024-05-11 NOTE — Telephone Encounter (Signed)
 Please put on CXL per Dr. Toi Foster

## 2024-05-11 NOTE — Telephone Encounter (Signed)
 LVM that I was going to send recommendations via MyChart and I did send.

## 2024-05-17 ENCOUNTER — Other Ambulatory Visit: Payer: Self-pay | Admitting: Psychiatry

## 2024-05-17 DIAGNOSIS — F3177 Bipolar disorder, in partial remission, most recent episode mixed: Secondary | ICD-10-CM

## 2024-05-17 DIAGNOSIS — F422 Mixed obsessional thoughts and acts: Secondary | ICD-10-CM

## 2024-05-17 DIAGNOSIS — F411 Generalized anxiety disorder: Secondary | ICD-10-CM

## 2024-05-20 ENCOUNTER — Ambulatory Visit: Payer: Federal, State, Local not specified - PPO | Admitting: Clinical

## 2024-05-20 DIAGNOSIS — F3177 Bipolar disorder, in partial remission, most recent episode mixed: Secondary | ICD-10-CM | POA: Diagnosis not present
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
## 2024-06-02 ENCOUNTER — Ambulatory Visit (INDEPENDENT_AMBULATORY_CARE_PROVIDER_SITE_OTHER): Payer: Federal, State, Local not specified - PPO | Admitting: Clinical

## 2024-06-02 DIAGNOSIS — F3181 Bipolar II disorder: Secondary | ICD-10-CM | POA: Diagnosis not present
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
## 2024-06-15 ENCOUNTER — Other Ambulatory Visit: Payer: Self-pay | Admitting: Psychiatry

## 2024-06-15 ENCOUNTER — Ambulatory Visit (INDEPENDENT_AMBULATORY_CARE_PROVIDER_SITE_OTHER): Admitting: Clinical

## 2024-06-15 DIAGNOSIS — F3181 Bipolar II disorder: Secondary | ICD-10-CM | POA: Diagnosis not present
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
## 2024-06-30 ENCOUNTER — Ambulatory Visit: Admitting: Clinical

## 2024-07-02 ENCOUNTER — Ambulatory Visit (INDEPENDENT_AMBULATORY_CARE_PROVIDER_SITE_OTHER): Admitting: Clinical

## 2024-07-02 DIAGNOSIS — F3181 Bipolar II disorder: Secondary | ICD-10-CM
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
## 2024-07-21 ENCOUNTER — Encounter: Payer: Self-pay | Admitting: Psychiatry

## 2024-07-21 ENCOUNTER — Telehealth: Admitting: Psychiatry

## 2024-07-21 ENCOUNTER — Telehealth: Payer: Self-pay | Admitting: Psychiatry

## 2024-07-21 DIAGNOSIS — G3184 Mild cognitive impairment, so stated: Secondary | ICD-10-CM | POA: Diagnosis not present

## 2024-07-21 DIAGNOSIS — F4000 Agoraphobia, unspecified: Secondary | ICD-10-CM

## 2024-07-21 DIAGNOSIS — F422 Mixed obsessional thoughts and acts: Secondary | ICD-10-CM | POA: Diagnosis not present

## 2024-07-21 DIAGNOSIS — F3181 Bipolar II disorder: Secondary | ICD-10-CM | POA: Diagnosis not present

## 2024-07-21 DIAGNOSIS — F3177 Bipolar disorder, in partial remission, most recent episode mixed: Secondary | ICD-10-CM

## 2024-07-21 DIAGNOSIS — F411 Generalized anxiety disorder: Secondary | ICD-10-CM

## 2024-07-21 DIAGNOSIS — F5105 Insomnia due to other mental disorder: Secondary | ICD-10-CM

## 2024-07-21 DIAGNOSIS — F3162 Bipolar disorder, current episode mixed, moderate: Secondary | ICD-10-CM

## 2024-07-21 DIAGNOSIS — F4010 Social phobia, unspecified: Secondary | ICD-10-CM

## 2024-07-21 MED ORDER — PROPRANOLOL HCL 20 MG PO TABS: 20.0000 mg | ORAL_TABLET | Freq: Two times a day (BID) | ORAL | 0 refills | Status: DC | PRN

## 2024-07-21 MED ORDER — CLONIDINE HCL 0.1 MG PO TABS: ORAL_TABLET | ORAL | 1 refills | Status: DC

## 2024-07-21 MED ORDER — QUETIAPINE FUMARATE 300 MG PO TABS: 300.0000 mg | ORAL_TABLET | Freq: Every day | ORAL | 0 refills | Status: DC

## 2024-07-21 NOTE — Progress Notes (Signed)
 Kristen Leon [id number] October 28, 1966 58 y.o.  Video Visit via My Chart  I connected with pt by My Chart video and verified that I am speaking with the correct person using two identifiers.   I discussed the limitations, risks, security and privacy concerns of performing an evaluation and management service by My Chart  and the availability of in person appointments. I also discussed with the patient that there may be a patient responsible charge related to this service. The patient expressed understanding and agreed to proceed.  I discussed the assessment and treatment plan with the patient. The patient was provided an opportunity to ask questions and all were answered. The patient agreed with the plan and demonstrated an understanding of the instructions.   The patient was advised to call back or seek an in-person evaluation if the symptoms worsen or if the condition fails to improve as anticipated.  I provided 30 minutes of video time during this encounter.  The patient was located at home and the provider was located office. Session started  100-130  Subjective:   Patient ID:  Kristen Leon is a 58 y.o. (DOB [birth year]) female.  Chief Complaint:  Chief Complaint  Patient presents with   Follow-up   Depression   Anxiety     Kristen Leon presents  today for follow-up ofBipolar disorder, generalized anxiety disorder, nocturnal panic attacks, history of OCD, and mild cognitive impairment and worsening driving anxiety.  She has a goal of getting off the Seroquel  in hopes of improving memory.  at visit August 06, 2019.  We needed to start a mood stabilizer and discussed variety of options and decided to start with Latuda .  We started at 20 mg and the plan was to increase gradually to 80 mg daily.  If that was successful this follow-up was going to lead to a potential reduction in an attempt to wean Seroquel . Never got up to 80 mg Latuda .    Did not noticed much of a  difference.  visit September 03, 2019.  The following changes were made: Increase vitamin D from 5000 units daily to 10K units daily Start Latuda   80 mg daily Start reduction in Seroquel  next week and reduce once every 3 weeks. Reduced Seroquel  from 600 to 200 mg HS.  At first had sleep and mood problems and angry.  Added in  gabapentin  300 mg total week ago and sleep better.  Able to sleep on her own.  Mood is better also.  visit October 12, 2019.  The following changes were made: Increase vitamin D from 5000 units daily to 10K units daily Continue Latuda   80 mg daily Continue gradual reduction in Seroquel  next week and reduce once every 3 to 4 weeks by 50 mg. Trying to get rid of gabapentin  and Seroquel  for cognitive reasons primarily  seen December 16, 2019.  The following was noted: Down to Seroquel  150 HS for 3 weeks.  Quality of sleepy slightly off.  How long on this dose?  Tends to wake 30 min earlier than usual.  Bought magnesium.   Feels fine in the day.  Slightly less harried and mentally cloudy with reduction in Seroquel . Likes the Latuda  as mood medication.   Overall mood and anxiety are under control.  Other concern is focus and memory. Wants to get off gabapentin  and Seroquel  for that reason.  Feels the meds may increase risk of dementia.  Overall she has seen some cognitive improvement since the reduction in Seroquel  as noted. She  was in courage to try reducing the Seroquel  further gradually.  She was encouraged to leave the gabapentin  at 300 mg daily because it was helpful.  seen March 14, 2020.  She was not doing well.  The following was noted: Couldn't sleep without Seroquel  200 mg.  Felt unstable and anxious.  For weeks felt she still struggled at 200 mg Seroquel  and raised the dose all the way back up to where she started bc felt desperate and was on edge.   Early 2000's not on Seroquel  and had a lot of anxiety and obsessive thought.  It helps anxiety. Mood better on Seroquel   and feels better physically and mentally.  A little down with stress and failure of switch to Latuda .  little anger but not much depression and no other sx of mania.    Driving anxiety and sense unreality resolved.  Fine with driving now. The following changes were made: Continue vitamin D from 5000 units daily to 10K units daily Wean Latuda  to 40 mg for a week and then stop it. Then start Saphris  5mg  HS and reduce Seroquel  400 mg at night for 5 days, then increase Saphris  10 mg at night and reduce Seroquel  to 200 mg at night for 5 days, then increase Saphris  to 15 HS and stop sEroquel .  04/12/2020 appointment the following is noted: As noted pt has been wanting off Seroquel  so med changes last visit wre intended to help. More anxious with reduced gabapentin  and Seroquel .  Hard to stop Seroquel  bc both anxiety and insomnia and using 100 mg Seroquel .  Missed a day of work over it yesterday.  More sleep with Saphris  than Latuda .  No SE with saphris . Anxiety included fidgety. Hasn't tried Saphris  15 mg daily yet.   Has felt in a little less foggy headed with reduced Seroquel  and gabapentin .  Down on gabapentin  for a while now and only a little change with that reduction.  A little better focus and memory now. Neuropsych testing next week. Plan: Increase Saphris  15 mg HS and stop Seroquel  again.  Can reduce Seroquel  to 50 or even lower if needed.    05/09/2020 phone call with the following noted: Patient had to go back up to 600 mg Seroquel  at hs, she has tried the last few nights at 500 mg. She's been unsuccessful tapering down and just wanted to have 200 mg and 100 mg tablets available to try and get it lowered.   07/07/2020 appointment with the following noted: Got close to off Seroquel  but felt untethered and trouble with sleep initial and terminal. 5-6 hours on low dose Seroquel . Never tried Saphris  15 mg for reasons that are unclear.  Stopped shortly after the phone session from last time. Started  Seroquel  by 2002 and gabapentin  in 2003.  Finds it very difficult to get off the meds and worry over instability. Plan: Start Saphris  10 mg at night and reduce Seroquel  to 400 mg nightly for 4 nights, Then increase Saphris  to 15 mg at night and reduce Seroquel  to 200 mg for 4 nights, Then reduce Seroquel  to 100 mg and continue Saphris  at 15 mg nightly for 4 nights, Then stop Seroquel  and if insomnia occurs, increase Saphris  to 20 mg at night.  07/19/2020 phone call.  Restless legs on Saphris .  Given instructions to change to loxapine  100 mg nightly. 07/25/2020 phone call patient stating she wanted to try to continue Saphris  but have treatment for the restless legs side effects. Prescription sent for ropinirole   09/13/2020 appointment with the following noted: Not taking Saphris .  At 10 mg HS CO jitteriness and anxiety the next day. Never took loxapine . Wonders about taking loxapine  now.  Plan: Give her this change in instructions for transition from Seroquel  to loxapine : Start loxapine  25 mg capsule 1 at night and reduce Seroquel  to 400 mg nightly for 4 nights, Then increase loxapine  to 2 capsules at night and reduce Seroquel  to 200 mg for 4 nights, Then reduce Seroquel  to 100 mg and increase loxapine  to 3 capsules nightly for 4 nights, Then stop Seroquel  and if insomnia occurs, increase loxapine  to 4 capsules at night and notify MD.   Multiple phone calls since the last visit including the following. 10/24/2020:Telephone call to patient.  She experiences an increase in anxiety whenever she reduces the Seroquel .  She is just started the transition from Seroquel  to loxapine .  She is tolerating the loxapine  well.  Encouraged her to just use the lorazepam  and transition from Seroquel  to loxapine .  It is hoped and expected that the loxapine  will effectively manage anxiety as the Seroquel  when she is on the full dose.  She agrees to the plan.  Lorazepam  prescription sent in. 11/17/2020: RTC  See  prior note.  Took 50 mg Seroquel  last night.  Realized she made a mistake in dosing.  She stopped from 200 mg Seroquel  instead of reducing to 100 mg as instructed.  Had gotten up to 4 loxapine  without seroquel  was jittery and like she'd come out of her skin and took lorazepam .  Still some problems with memory.  Last night took 50 mg Seroquel  and was a little better. Much better today than yesterday.   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   Memory testing with visuospatial problems and was worked up over that.   Take 4 loxapine  and start 75 mg Seroquel  and taper from there more slowly. Appt 11/22/20  11/22/2020:Pt requesting Rx for Loxapine  25 mg 4/d  30 day and Seroquel  200 mg 3/d   30 day and Lorazepam  0.5 mg 1-2 tab every 8 hrs PRN for anxiety  @ [facility] [employer] on file. Apt RS from 12/21 to 2/9 and on canc list. Pt going out of town 12/27-1/3 asking for Rx before 12/27.  11/22/2020 MD response: I sent in the prescription for lorazepam .  Patient is not on quetiapine  200 mg 3 daily and I will not prescribe that dose.  Ask her what dose of quetiapine  she is actually taking.  Also the insurance will not cover 4 loxapine  capsules daily.  If she is taking 4 I will need to change it to 2 of the 50 mg capsules.  Please verify her current dose of loxapine  and quetiapine .  I am weaning her off of quetiapine  she should not be on that high of a dose of quetiapine . 11/23/2020:Pt called back stating she would like to stop Loxapine  completely. She is having blurred vision and jittery. She did not pick up Loxapine  Rx. She is currently taking quetiapine  100 mg (4-25 mg tabs daily). Requesting to go back to 600 mg daily. Requesting Rx for this.  Agreed 12/01/2020:Wadie called to request that you decrease her gabapentin  and prescribe Buspar .  Please call to discuss this.  appt 01/11/21.  MD response: Took buspar  years ago and wants to try it again and reduce  gabapentin .  Currently on 1200 mg HS.  Reduce to 900 mg gabapentin .   Start buspirone  5  mg twice daily, and increase to 15 mg BID. Disc SE.  She agrees.  01/11/2021 appointment with the following noted: Completely off gabapentin  for a couple of weeks maybe.  Initially anxiety a little high in AM.  Increased buspirone . Had increase anxiety when off Seroquel .  Was off about 4 days and memory was not better.  Anxiety is better now. Anxiety is a little higher off gabapentin  but not unmanageable.  Sleep is not as good off the gabapentin .  More initial insomnia and takes melatonin to manage. Not marked difference with cognition off gabapentin . Read about antipsychotics reducing brain volume and still wants to stop Seroquel  despite multiple failed attempts. Wants to switch from Seroquel  to trazodone .  Plan: OK trial Seroquel  reduction to 400 mg daily with no other med changes.  02/17/2021 appointment with the following noted: Stopped buspirone  without change. Stopped gabapentin . Reduced Seroquel  to 400 mg HS. She is convinced antipsychotic is causing brain loss.  She wants to be off antipsychotics bc of this concern and risk of dementia.  Don't think fears of dementia are unfounded. Doesn't believe she has psychotic sx. Wants to retry lithium  and then trazodone  in place of Seroquel .  Took lithium  a long time.   Acknowledges prior failures weaning Seroquel  multiple times but thinks maybe it was too fast.  Sleep most of the night and sleep less deep than 600 mg Seroquel .  Also less deep without gabapentin .  Altogether 6-7 hours but leaves the TV on.   In bed 8 watching TV and doesn't try to sleep until 1030. Plan: She  failed attempts to get off Seroquel  multiple times. OK trial Seroquel  reduction to 300 mg daily for 3-4 weeks, then 200 mg for 3-4 weeks. Disc withdrawal effects from Seroquel . Start lithium  300 mg nightly for a week then 600 mg nightly. Wait a week and get blood level.    04/12/2021  appointment with the following noted: Did get down to Seroquel  200 mg HS and up to lithium  600 HS and trazodone  100 mg HS. Sleep more interrupted but 6-7 hours with poor quality and less well rested with change.  Mood really good with lithium  and maybe better. Often had rapid cycling and it seemed to stop. But sleep sucks and feels it affects her during the day Still takes mirtazapine  15 mg and added trazodone .  Sleep worse without trazodone . No SE with lithium . Cognition  more clear with better focus with less Seroquel .   Patient reports stable mood and denies depressed or irritable moods.  Patient denies any recent difficulty with anxiety, surprisingly. Denies appetite disturbance.  Patient reports that energy and motivation have been good.  Patient denies any difficulty with concentration.  Patient denies any suicidal ideation.  Plan: Continue Seroquel   200 mg for 3-4 weeks. Continue lithium  600 mg nightly. Check another level  Stop mirtazapine  and trazodone  Doxepin  10-30 mg HS   04/24/2021 phone call complaining of obsessive thinking and wanting medicine sent into the pharmacy. MD response: Her case is very complicated because she has complained of side effects from atypicals and worried about cognitive side effects of benzodiazepines.  She is bipolar and therefore we cannot use SSRIs.  She does not want to take atypical antipsychotics.  She is on a low dose of Seroquel  now and I am sure does not want to increase the dosage back up.  She has a as needed prescription for lorazepam  she can use that.  I will not make any other major med changes over  the phone outside of a scheduled appointment.  If the symptoms are bothersome enough however going to the cancellation list and we will attempt to get her into an appointment sooner.  06/01/2021 appointment with the following noted: Thinks she stopped trazodone  and ? Took doxepin .  She vaguely think she tried it and it didn't help sleep. Mood more  stable on lithium  600 HS.  Prior mood cycling into dep and mania have resolved on it.. Still problems with her sleep. Increased Seroquel  to 400 mg on her own for sleep added gabapentin  600 and sleep is better but wants off both of them. Still on mirtazapine  30.   Cholecystectomy scheduled for August. Had stopped Seroquel  and then had intrusive thoughts which have now resolved.  At the time she called they were bad. Plan: Continue lithium  600 mg nightly. Check another level before next appt Wants to try again to taper quetiapine , will proceed as follows: Stop mirtazapine  DT NR Retry Doxepin  at higher dose of 50 mg up to 100 mg HS Once sleeping well then try to taper Seroquel  very slowly such as reduce by 50 mg every 2 to 3 weeks or even slower if necessary.  09/05/2021 appointment with the following noted: Got down to Seroquel  200 and taking gabapentin  600 mg HS. Never tried 50-100 mg for sleep. Off track for 6 weeks without enough sleep.  Cholecystectomy recently. On phone with BF too much at night talking to him bc of his work schedule.   More irritable and tired.   Older cousin moved in for 3 mos and stressed by that and esp bc he brought a dog.  He's taking advantage of her and brought his GF. Stopped lithium  AMA bc I was afraid of it.   I want to go back on lithium  bc it helped and retry doxepin  at higher dose and try wean Seroquel .  Then wean gabapenin.   Was more focused and better mood stability on lithium . Mirtazapine  does not help sleep much. Paranoid about being hurt if she dates and no history of it.  Develop a whole story about what might happen. Plan: Stop mirtazapine  DT NR Retry Doxepin  at higher dose of 50 mg up to 100 mg HS Once sleeping well then try to taper Seroquel  very slowly such as reduce by 50 mg every 2 to 3 weeks or even slower if necessary. Restart lithium  600 mg nightly. Check another level before next appt Don't stop meds AMA.   Call if there's a  problem.  11/16/21 appt noted: Second week of Seroquel  150 mg daily. Is sleeping currently but not the same quality but not terrible.  On doxepin  75 mg HS Mood is good.  Likes the lithium  better than Seroquel  for mood.  Fewer hypomanic episones and more stable with less anger. Never got lithium  level.  Says she'll do so. Rare lorazepam . No SE right now. Asks about Ozempic. Plan: continue Doxepin  at higher dose of 50 mg up to 100 mg HS Once sleeping well then try to taper Seroquel  very slowly such as reduce by 50 mg every 2 to 3 weeks or even slower if necessary. Continue lithium  600 mg nightly. Get lithium  level ASAP.  Then repeat lithium  level with every 20 # of weight loss Don't stop meds AMA.   Call if there's a problem.  01/18/2022 appointment with the following noted: Ran out of doxepin  and says refill rejected for 3 weeks. Was feeling kind of down. Down to Seroquel  25 mg HS for  2 + weeks. Still sleeping with some awakening.  Surprising good sleep.  6 and 1/2 hours of sleep and not drowsy daytime. Mood is fine.   Not sure if doxepin  helped sleep but would like to have it. Patient reports stable mood and denies depressed or irritable moods.  Patient denies any recent difficulty with anxiety.  Patient denies difficulty with sleep initiation or maintenance. Denies appetite disturbance.  Patient reports that energy and motivation have been good.  Patient denies any difficulty with concentration.  Patient denies any suicidal ideation. Plan no med chages  04/17/22 appt noted: Takes gabapentin  prn sleep and it helps. Felt dull on lithium  and U frequency so reduced it to 1of 300 mg  lithium  daily. Down to Seroquel  25 mg HS and Sonata  on occasion. Feels less dull with less lithium .  Thinks she was more depressed on lihtium at 600 mg daily.  Feels better the last 5 days.  Less urinary frequency. Sleep is less deep with less Seroquel  and shorter duration 6 hours.  It's not terrrible but not  great. 100 times better cognitively with less Seroquel .   Noticed it at work. Anxiety in check and she's surprised. Doesn't want med changes today. Plan; Cogniton better with less serotquel 25 mg HS. She wants to try lorazepam  0.5 mg HS in place of Sonata  to see if she can sleep longer. Continue lithium  600 mg nightly was recommended but she dropped it to 300 mg nightly because she complained of dullness at the 600 mg and perhaps even some depression.  We agreed on a compromise of 450 mg nightly.  Even that will be below the usual therapeutic range and there is significant risk of mood instability and relapse with this and she accepts that risk.   07/04/22 appt noted: Using lorazepam  about every 3 mos. Taking Sonata  more about 1/2 the time. Taking quetiapine  25 mg nightly and lithium  450 mg daily. Don't think meds working out for her.  Not taking lithium  enough.  More hypomania and sleep not enough nor restorative. Hypomania includes talking to herself a lot and tell funny stories to herself. Excitable mood and speech. More energy and need to burn it off.  Therapist noticed.   EMA.  About 5 hours sleep since off more Seroquel . On more lithium  had less hypomania but felt flat on more lithium .  Still frequent urination.   Saw a urologist. Ativan  not helpful sleep Plan: Cogniton complaints resolved with less serotquel 25 mg HS. But not sleeping.   Option switch to Thorazine  25 mg for less hangover.  She will call in a couple of weeks and if sleep is not better we will switch to the Thorazine .  She understands this is not an antipsychotic at these low dosages but it can have other side effects like dizziness or drowsiness. Increase lithium  to 450/600 mg QOD bc hypomania at 450 daily and dullness at 600 mg daily. Check lithium  level in 2 weks.   09/07/22 NS  10/19/2022 appointment noted: Chlorpromazine  made her feel wired and only taken once or twice and stopped. Still trouble with sleep unless  takes more Seroquel .  Seroquel  helps her go to sleep but not stay asleep. Thinks lithium  is dulling and more periods of hypomania.  Wants to stay on lithium  bc doesn't want to take antipsychotic for mood stabilizer.  Don't feel as connected to people on it bc feels flat.  Hard to feel emotional about things.  On Seroquel  felt like personality was more normal.  Cognition definitely better off the Seroquel . Sonata  about once every 3 weeks.    Avg 5 hours of sleep nightly without napping.  Not drowsy but is tired during the day.  Can't sleep withoout meds. Has been having a lot of hypomania but no one else is complaining.  More than I usually have and can notice it at work.  More chatty and more inclined to be outspoken and borderline inappopriate.  No anger problems.   Not depressed much ever.   Gall bladder removed last year and GI Sx ongoing with ongoing workup.  GI problems predate lithium  she thinks.  02/19/23 appt noted:  Switch lithium  in AM 2 weeks ago and less compliant.  Mood less stable and more irritable.  CO polyuria and nocturia. Sleep impacted by not usually taking quetiapine .  Sleep irregular 5-6 hours.  Recognizes she can be rude to her kids.. I'm not open to an antipsychotic.   Only taking lithium  about 300 mg 3 days in last week. Taking ativan  helped her feel much better.  She wants to take one of the 0.5 mg in the AM to calm her irritability. Plan discussed off-label clonidine  for bipolar irritability and potential antimanic effect.  We did this given her reluctance to take alternatives and failure of multiple options noted below  05/22/23 appt noted: Meds: clonidine  0.1 mg BID, Namenda  , lithium  150 mg daily, Seroquel  25 mg HS and occ extra. Sonata  prn for sleep. Seroquel  helps her fall asleep.  Recently hypomania is different with clonidine .  Seeing it more often and lasts longer and wonders about taking more lithium  when it happens.  Not happened in a while. First noted with  subtherapeutic dose lithium  when coming off of it.  Saying things she wouldn't normally say.  Clonidine  helped but didn't stop it from happening.  Hypomania can last up to a week but feels more normal now.   When took steroid was hypomanic.  Still on NAC, levoxyl, vit D.  Taking atenolol also. Not particularly dep.  Anxiety manageable.  Not hypomanic now.  Feels more unrestrained in conversations and does concern her.  She feels her brain is clearer off the mood stabilizers.  Better in conversation.  Feels better emotionally bc feels more emotionally available to people than before.. Not currently irritable with clonidine , it helps a good bit.   Monitors BP and it is normal.  Saw card who said is ok to take clonidine .  Normal pulse.   Likes the clonidine  as an off label mood stabilizer with benefit for hypomania and resolved irritablity. Plan: Clonidine  0.1 mg increase to 1 in the AM and 2 at night off label for manic irritability and mild hypomania.   08/22/23 appt noted: Psych med: NAC, clonidine  0.1 mg BID and 0.2 mg HS, quetiapine  50-75 mg HS, lithium  150 mg daily. Needed increase clonidine  and card ok'd that.  Really liked clonidine  initially but now jury is still out.   Don't generally have depressive sx but had been feeling down.  Also sometimes feels like she might explode with anger.   Sleep is not good with initial and terminal. Awakens 330 and can't go back to sleep.   No sig paranoia but admits to feeling unsafe in public.  Something unexpected could happen  to she or her kids.  Don't feel that safe in grocery store. But not avoiding necessary shopping.  Partly bc Cousin killed by train 6 mos ago. Asks about trying Remeron  for sleep Cog function much  better and thinks being off high dose quetiapine  has helped.  Very happy in that regard.  10/22/23 appt note: Psych med: NAC, clonidine  0.1 mg BID and 0.2 mg HS, quetiapine  50-75 mg HS, lithium  150 mg daily, added mirtazapine  30 mg HS,  sonata  prn EMA,  The best I've ever been.  Better mood and sleep with mirtazapine .  Clonidine  helped.  Not as impulsive, better self control.  Feels sharper with less Seroquel . Sleep can still be erratic.  Nocturia wakes her.   Occ takes more Neurontin  400 with 200 mg Seroquel  when needs a good night sleep about once monthly.  Biggest change with clonidine  and mirtazapine .   Tolerating meds.   Not jittery nor lightheaded.   Plan: no changes  01/29/24 appt noted: Med: no NAC out, clonidine  0.1 mg BID and 0.2 mg HS, quetiapine   200 mg HS, lithium  150 mg daily, stopped mirtazapine  30 mg HS, sonata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

## 2024-07-22 ENCOUNTER — Ambulatory Visit
Admission: RE | Admit: 2024-07-22 | Discharge: 2024-07-22 | Disposition: A | Discharge status: Home / Self Care | Payer: Self-pay | Attending: Emergency Medicine | Admitting: Emergency Medicine

## 2024-07-22 VITALS — BP 109/69 | HR 79 | Temp 97.8°F | Resp 17

## 2024-07-22 DIAGNOSIS — T24539A Corrosion of first degree of unspecified lower leg, initial encounter: Secondary | ICD-10-CM

## 2024-07-22 MED ORDER — BACITRACIN ZINC 500 UNIT/GM EX OINT: 1.0000 | TOPICAL_OINTMENT | Freq: Two times a day (BID) | CUTANEOUS | 0 refills | Status: DC
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
## 2024-07-23 NOTE — Telephone Encounter (Signed)
 Error

## 2024-07-28 ENCOUNTER — Ambulatory Visit (INDEPENDENT_AMBULATORY_CARE_PROVIDER_SITE_OTHER): Admitting: Clinical

## 2024-07-28 DIAGNOSIS — F3181 Bipolar II disorder: Secondary | ICD-10-CM
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
## 2024-08-12 ENCOUNTER — Ambulatory Visit (INDEPENDENT_AMBULATORY_CARE_PROVIDER_SITE_OTHER): Admitting: Clinical

## 2024-08-12 DIAGNOSIS — F3181 Bipolar II disorder: Secondary | ICD-10-CM | POA: Diagnosis not present

## 2024-08-12 DIAGNOSIS — F3177 Bipolar disorder, in partial remission, most recent episode mixed: Secondary | ICD-10-CM
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
## 2024-08-26 ENCOUNTER — Ambulatory Visit: Admitting: Clinical

## 2024-08-26 DIAGNOSIS — F3181 Bipolar II disorder: Secondary | ICD-10-CM | POA: Diagnosis not present
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
## 2024-08-27 ENCOUNTER — Other Ambulatory Visit: Payer: Self-pay | Admitting: Psychiatry

## 2024-09-09 ENCOUNTER — Ambulatory Visit: Admitting: Clinical

## 2024-09-09 DIAGNOSIS — F3181 Bipolar II disorder: Secondary | ICD-10-CM | POA: Diagnosis not present
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
## 2024-09-14 ENCOUNTER — Ambulatory Visit: Admitting: Psychiatry

## 2024-09-23 ENCOUNTER — Ambulatory Visit: Admitting: Clinical

## 2024-09-23 DIAGNOSIS — F3181 Bipolar II disorder: Secondary | ICD-10-CM | POA: Diagnosis not present
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
## 2024-10-01 ENCOUNTER — Ambulatory Visit: Admitting: Clinical

## 2024-10-01 ENCOUNTER — Other Ambulatory Visit: Payer: Self-pay | Admitting: Psychiatry

## 2024-10-01 DIAGNOSIS — F5105 Insomnia due to other mental disorder: Secondary | ICD-10-CM

## 2024-10-01 DIAGNOSIS — F3162 Bipolar disorder, current episode mixed, moderate: Secondary | ICD-10-CM

## 2024-10-07 ENCOUNTER — Ambulatory Visit: Admitting: Clinical

## 2024-10-07 DIAGNOSIS — F3171 Bipolar disorder, in partial remission, most recent episode hypomanic: Secondary | ICD-10-CM

## 2024-10-07 DIAGNOSIS — F3181 Bipolar II disorder: Secondary | ICD-10-CM
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
## 2024-10-26 ENCOUNTER — Ambulatory Visit: Admitting: Clinical

## 2024-11-03 ENCOUNTER — Ambulatory Visit
Admission: RE | Admit: 2024-11-03 | Discharge: 2024-11-03 | Disposition: A | Discharge status: Home / Self Care | Attending: Family Medicine | Admitting: Family Medicine

## 2024-11-03 ENCOUNTER — Other Ambulatory Visit: Payer: Self-pay

## 2024-11-03 VITALS — BP 111/73 | HR 96 | Temp 99.5°F | Resp 18 | Ht 66.0 in | Wt 173.0 lb

## 2024-11-03 DIAGNOSIS — R6889 Other general symptoms and signs: Secondary | ICD-10-CM | POA: Diagnosis not present

## 2024-11-03 DIAGNOSIS — J029 Acute pharyngitis, unspecified: Secondary | ICD-10-CM

## 2024-11-03 LAB — POCT INFLUENZA A/B
Influenza A, POC: NEGATIVE
Influenza B, POC: NEGATIVE

## 2024-11-03 LAB — POC SOFIA SARS ANTIGEN FIA: SARS Coronavirus 2 Ag: NEGATIVE

## 2024-11-03 LAB — POCT RAPID STREP A (OFFICE): Rapid Strep A Screen: NEGATIVE

## 2024-11-03 MED ORDER — PREDNISONE 20 MG PO TABS: 40.0000 mg | ORAL_TABLET | Freq: Every day | ORAL | 0 refills | Status: AC

## 2024-11-03 MED ORDER — HYDROCOD POLI-CHLORPHE POLI ER 10-8 MG/5ML PO SUER: 5.0000 mL | Freq: Two times a day (BID) | ORAL | 0 refills | Status: AC | PRN

## 2024-11-03 NOTE — ED Triage Notes (Signed)
 Pt presenting with c/o sore throat, chill, headache, nasal congestion and non productive cough x3 days. Pt stated that she used Theraflu,Tylenol  with Codeine, and Aspirin which were ineffective.

## 2024-11-03 NOTE — Discharge Instructions (Signed)
 Take the prednisone  once a day for 5 days Take Tussionex cough syrup every 12 hours.  When you are working you will only take it at bedtime because it causes drowsiness Sure you are drinking lots of fluids See your doctor if not improving by next week

## 2024-11-03 NOTE — ED Provider Notes (Signed)
 [facility]    CSN: [id number] Arrival date & time: 11/03/24  1337      History   Chief Complaint Chief Complaint  Patient presents with   Sore Throat    Head congestion, throat painful - Entered by patient    HPI [facility] is a 58 y.o. female.   HPI  Patient has a painful sore throat, head congestion, fever, cough.  Symptoms present 2 to 3 days.  Here for evaluation.  Not exposed to any illness that she knows of.  Generally in good health with prior smoking but no known lung disease.  Under care of psychiatry, doing well.  Past Medical History:  Diagnosis Date   Anxiety    Bipolar 1 disorder (HCC)    followed by dr c. cottle   Carpal tunnel syndrome on both sides    CKD (chronic kidney disease), stage II    followed by pcp   Endometriosis    Hypothyroidism, postablative    endocrinologist--- dr c. charlane--- dx toxic multinodular thyroid 2000 w/ hyperthyroidism s/p RAI  2000, 2002, and 2004;  post hypothryoid in 2008;   last bx 2012 left side, benign per pt   Irritable bowel syndrome with constipation    followed by dr w. lucio ([facility])   MDD (major depressive disorder)    Multinodular thyroid    in 2000 dx toxic post ablative   NAFLD (nonalcoholic fatty liver disease)    OSA (obstructive sleep apnea)    05-18-2021  per pt study done approx. 2018, told moderat OSA,  does uses cpap   Tachycardia    cardiologist--- [facility] @ [facility] in [place],  w/ palpitations, controlled w/ atenolol;  pt has had work-up per note echo 2014 normal ,  event monitor 2018 showedST w/ mild ST no arrhythmia,  ETT 02/ 2022 normal no ischemia, ef 67%   Wears contact lenses     Patient Active Problem List   Diagnosis Date Noted   Bipolar 1 disorder, mixed, moderate (HCC) 08/29/2018   OCD (obsessive compulsive disorder) 08/29/2018   MCI (mild cognitive impairment) 08/29/2018    Past Surgical History:  Procedure Laterality Date   COLONOSCOPY  2019   DIAGNOSTIC LAPAROSCOPY   1995   w/ fulgeration endometriosis   DILATATION & CURETTAGE/HYSTEROSCOPY WITH MYOSURE N/A 05/24/2021   Procedure: LELDON WITH ENDOMETRIAL SAMPLING;  Surgeon: Estelle Service, MD;  Location: [facility] [place];  Service: Gynecology;  Laterality: N/A;   ESOPHAGOGASTRODUODENOSCOPY  2008   FOOT SURGERY Bilateral 2016   approx---  bilateral 5th toe , removal bone    OB History   No obstetric history on file.      Home Medications    Prior to Admission medications   Medication Sig Start Date End Date Taking? Authorizing Provider  chlorpheniramine-HYDROcodone (TUSSIONEX) 10-8 MG/5ML Take 5 mLs by mouth every 12 (twelve) hours as needed for cough. 11/03/24  Yes Maranda Jamee Jacob, MD  predniSONE  (DELTASONE ) 20 MG tablet Take 2 tablets (40 mg total) by mouth daily with breakfast. 11/03/24  Yes Maranda Jamee Jacob, MD  Acetylcysteine (NAC) 600 MG CAPS Take 1 capsule (600 mg total) by mouth daily. 09/13/20   Cottle, Lorene KANDICE Raddle., MD  atenolol (TENORMIN) 50 MG tablet Take 25 mg by mouth at bedtime.    [provider]  B Complex Vitamins (VITAMIN B-COMPLEX) TABS Take 1 tablet by mouth at bedtime.    [provider]  Calcium Carbonate-Vitamin D (CALCIUM-D PO) Take by  mouth at bedtime.    [provider]  Cholecalciferol (VITAMIN D3) 5000 units CAPS Take 1 capsule by mouth at bedtime.    [provider]  cloNIDine  (CATAPRES ) 0.1 MG tablet 1 in the AM and afternoon and 2 at night 07/21/24   Cottle, Lorene KANDICE Raddle., MD  ESTRING 7.5 MCG/24HR vaginal ring  07/17/22   [provider]  levothyroxine (SYNTHROID, LEVOTHROID) 112 MCG tablet Take 112 mcg by mouth at bedtime.    [provider]  linaclotide (LINZESS) 290 MCG CAPS capsule Take 290 mcg by mouth at bedtime. 09/06/17   [provider]  lithium  carbonate 150 MG capsule TAKE 1 CAPSULE DAILY AT 12 NOON 08/28/24   Cottle, Lorene KANDICE Raddle., MD  LORazepam  (ATIVAN ) 0.5 MG tablet TAKE 1 TO 2  TABLETS EVERY 8HOURS AS NEEDED FOR        ANXIETY. 05/18/24   Cottle, Carey G Jr., MD  MAGNESIUM PO Take 400 mg by mouth at bedtime.    [provider]  QUEtiapine  (SEROQUEL ) 300 MG tablet TAKE 1 TABLET AT BEDTIME 10/04/24   Cottle, Carey G Jr., MD  temazepam (RESTORIL) 15 MG capsule 2 times in 6 mos 01/01/17   [provider]  tretinoin (RETIN-A) 0.025 % cream Apply topically at bedtime. face    [provider]  zaleplon  (SONATA ) 10 MG capsule Take 1 capsule (10 mg total) by mouth at bedtime. 10/22/23   Cottle, Lorene KANDICE Raddle., MD    Family History Family History  Problem Relation Age of Onset   Thyroid disease Mother    Diabetes Mother    Hypertension Mother     Social History Social History   Tobacco Use   Smoking status: Former    Current packs/day: 0.00    Types: Cigarettes    Start date: 1995    Quit date: 2000    Years since quitting: 25.9   Smokeless tobacco: Never  Vaping Use   Vaping status: Never Used  Substance Use Topics   Alcohol use: Yes    Alcohol/week: 1.0 standard drink of alcohol    Types: 1 Standard drinks or equivalent per week   Drug use: Never     Allergies   Patient has no known allergies.   Review of Systems Review of Systems See HPI  Physical Exam Triage Vital Signs ED Triage Vitals  Encounter Vitals Group     BP 11/03/24 1349 111/73     Girls Systolic BP Percentile --      Girls Diastolic BP Percentile --      Boys Systolic BP Percentile --      Boys Diastolic BP Percentile --      Pulse Rate 11/03/24 1349 96     Resp 11/03/24 1349 18     Temp 11/03/24 1349 99.5 F (37.5 C)     Temp Source 11/03/24 1349 Oral     SpO2 11/03/24 1349 99 %     Weight 11/03/24 1353 173 lb (78.5 kg)     Height 11/03/24 1353 5' 6 (1.676 m)     Head Circumference --      Peak Flow --      Pain Score 11/03/24 1352 6     Pain Loc --      Pain Education --      Exclude from Growth Chart --    No data found.  Updated Vital  Signs BP 111/73 (BP Location: Right Arm)   Pulse 96  Temp 99.5 F (37.5 C) (Oral)   Resp 18   Ht 5' 6 (1.676 m)   Wt 78.5 kg   SpO2 99%   BMI 27.92 kg/m      Physical Exam Constitutional:      General: She is not in acute distress.    Appearance: She is well-developed and normal weight. She is ill-appearing.  HENT:     Head: Normocephalic and atraumatic.     Right Ear: Tympanic membrane normal.     Left Ear: Tympanic membrane normal.     Nose: Congestion present.     Mouth/Throat:     Mouth: Mucous membranes are moist.     Pharynx: Uvula midline. Posterior oropharyngeal erythema present. No pharyngeal swelling or uvula swelling.     Tonsils: No tonsillar exudate. 1+ on the right. 1+ on the left.  Eyes:     Conjunctiva/sclera: Conjunctivae normal.     Pupils: Pupils are equal, round, and reactive to light.  Cardiovascular:     Rate and Rhythm: Normal rate and regular rhythm.     Heart sounds: Normal heart sounds.  Pulmonary:     Effort: Pulmonary effort is normal. No respiratory distress.  Musculoskeletal:        General: Normal range of motion.     Cervical back: Normal range of motion.  Lymphadenopathy:     Cervical: No cervical adenopathy.  Skin:    General: Skin is warm and dry.  Neurological:     Mental Status: She is alert.      UC Treatments / Results  Labs (all labs ordered are listed, but only abnormal results are displayed) Labs Reviewed  POCT INFLUENZA A/B - Normal  POC SOFIA SARS ANTIGEN FIA - Normal  CULTURE, GROUP A STREP [facility])  POCT RAPID STREP A (OFFICE)    EKG   Radiology No results found.  Procedures Procedures (including critical care time)  Medications Ordered in UC Medications - No data to display  Initial Impression / Assessment and Plan / UC Course  I have reviewed the triage vital signs and the nursing notes.  Pertinent labs & imaging results that were available during my care of the patient were reviewed by me and  considered in my medical decision making (see chart for details).     Strep, COVID, flu test are negative Discussed viral illness Will treat for symptoms Final Clinical Impressions(s) / UC Diagnoses   Final diagnoses:  Sore throat  Flu-like symptoms     Discharge Instructions      Take the prednisone  once a day for 5 days Take Tussionex cough syrup every 12 hours.  When you are working you will only take it at bedtime because it causes drowsiness Sure you are drinking lots of fluids See your doctor if not improving by next week   ED Prescriptions     Medication Sig Dispense Auth. Provider   predniSONE  (DELTASONE ) 20 MG tablet Take 2 tablets (40 mg total) by mouth daily with breakfast. 10 tablet Maranda Jamee Jacob, MD   chlorpheniramine-HYDROcodone (TUSSIONEX) 10-8 MG/5ML Take 5 mLs by mouth every 12 (twelve) hours as needed for cough. 115 mL Maranda Jamee Jacob, MD      I have reviewed the PDMP during this encounter.   Maranda Jamee Jacob, MD 11/03/24 [phone]

## 2024-11-05 LAB — CULTURE, GROUP A STREP (THRC)

## 2024-11-11 ENCOUNTER — Ambulatory Visit: Admitting: Psychiatry

## 2024-11-17 ENCOUNTER — Other Ambulatory Visit: Payer: Self-pay | Admitting: Psychiatry

## 2024-11-19 ENCOUNTER — Ambulatory Visit: Admitting: Clinical

## 2024-11-19 ENCOUNTER — Encounter: Payer: Self-pay | Admitting: Psychiatry

## 2024-11-19 ENCOUNTER — Telehealth: Admitting: Psychiatry

## 2024-11-19 DIAGNOSIS — F3177 Bipolar disorder, in partial remission, most recent episode mixed: Secondary | ICD-10-CM | POA: Diagnosis not present

## 2024-11-19 DIAGNOSIS — F5105 Insomnia due to other mental disorder: Secondary | ICD-10-CM | POA: Diagnosis not present

## 2024-11-19 DIAGNOSIS — F422 Mixed obsessional thoughts and acts: Secondary | ICD-10-CM | POA: Diagnosis not present

## 2024-11-19 DIAGNOSIS — F4010 Social phobia, unspecified: Secondary | ICD-10-CM

## 2024-11-19 DIAGNOSIS — F411 Generalized anxiety disorder: Secondary | ICD-10-CM | POA: Diagnosis not present

## 2024-11-19 DIAGNOSIS — G3184 Mild cognitive impairment, so stated: Secondary | ICD-10-CM

## 2024-11-19 DIAGNOSIS — F3181 Bipolar II disorder: Secondary | ICD-10-CM | POA: Diagnosis not present

## 2024-11-19 MED ORDER — LITHIUM CARBONATE 150 MG PO CAPS: 150.0000 mg | ORAL_CAPSULE | Freq: Every day | ORAL | 0 refills | Status: AC

## 2024-11-19 MED ORDER — CLONIDINE HCL 0.1 MG PO TABS: ORAL_TABLET | ORAL | 0 refills | Status: AC

## 2024-11-19 MED ORDER — QUETIAPINE FUMARATE 300 MG PO TABS: 300.0000 mg | ORAL_TABLET | Freq: Every day | ORAL | 0 refills | Status: AC

## 2024-11-19 NOTE — Progress Notes (Signed)
 Kristen Leon [id number] 12/15/1965 58 y.o.  Video Visit via My Chart  I connected with pt by My Chart video and verified that I am speaking with the correct person using two identifiers.   I discussed the limitations, risks, security and privacy concerns of performing an evaluation and management service by My Chart  and the availability of in person appointments. I also discussed with the patient that there may be a patient responsible charge related to this service. The patient expressed understanding and agreed to proceed.  I discussed the assessment and treatment plan with the patient. The patient was provided an opportunity to ask questions and all were answered. The patient agreed with the plan and demonstrated an understanding of the instructions.   The patient was advised to call back or seek an in-person evaluation if the symptoms worsen or if the condition fails to improve as anticipated.  I provided 30 minutes of video time during this encounter.  The patient was located at home and the provider was located office. Session started  100-130  Subjective:   Patient ID:  Kristen Leon is a 58 y.o. (DOB [birth year]) female.  Chief Complaint:  Chief Complaint  Patient presents with   Follow-up   Depression   Manic Behavior   Sleeping Problem   Medication Reaction     Kristen Leon presents  today for follow-up ofBipolar disorder, generalized anxiety disorder, nocturnal panic attacks, history of OCD, and mild cognitive impairment and worsening driving anxiety.  She has a goal of getting off the Seroquel  in hopes of improving memory.  at visit August 06, 2019.  We needed to start a mood stabilizer and discussed variety of options and decided to start with Latuda .  We started at 20 mg and the plan was to increase gradually to 80 mg daily.  If that was successful this follow-up was going to lead to a potential reduction in an attempt to wean Seroquel . Never got up to 80 mg Latuda .     Did not noticed much of a difference.  visit September 03, 2019.  The following changes were made: Increase vitamin D from 5000 units daily to 10K units daily Start Latuda   80 mg daily Start reduction in Seroquel  next week and reduce once every 3 weeks. Reduced Seroquel  from 600 to 200 mg HS.  At first had sleep and mood problems and angry.  Added in  gabapentin  300 mg total week ago and sleep better.  Able to sleep on her own.  Mood is better also.  visit October 12, 2019.  The following changes were made: Increase vitamin D from 5000 units daily to 10K units daily Continue Latuda   80 mg daily Continue gradual reduction in Seroquel  next week and reduce once every 3 to 4 weeks by 50 mg. Trying to get rid of gabapentin  and Seroquel  for cognitive reasons primarily  seen December 16, 2019.  The following was noted: Down to Seroquel  150 HS for 3 weeks.  Quality of sleepy slightly off.  How long on this dose?  Tends to wake 30 min earlier than usual.  Bought magnesium.   Feels fine in the day.  Slightly less harried and mentally cloudy with reduction in Seroquel . Likes the Latuda  as mood medication.   Overall mood and anxiety are under control.  Other concern is focus and memory. Wants to get off gabapentin  and Seroquel  for that reason.  Feels the meds may increase risk of dementia.  Overall she has seen some cognitive  improvement since the reduction in Seroquel  as noted. She was in courage to try reducing the Seroquel  further gradually.  She was encouraged to leave the gabapentin  at 300 mg daily because it was helpful.  seen March 14, 2020.  She was not doing well.  The following was noted: Couldn't sleep without Seroquel  200 mg.  Felt unstable and anxious.  For weeks felt she still struggled at 200 mg Seroquel  and raised the dose all the way back up to where she started bc felt desperate and was on edge.   Early 2000's not on Seroquel  and had a lot of anxiety and obsessive thought.  It helps  anxiety. Mood better on Seroquel  and feels better physically and mentally.  A little down with stress and failure of switch to Latuda .  little anger but not much depression and no other sx of mania.    Driving anxiety and sense unreality resolved.  Fine with driving now. The following changes were made: Continue vitamin D from 5000 units daily to 10K units daily Wean Latuda  to 40 mg for a week and then stop it. Then start Saphris  5mg  HS and reduce Seroquel  400 mg at night for 5 days, then increase Saphris  10 mg at night and reduce Seroquel  to 200 mg at night for 5 days, then increase Saphris  to 15 HS and stop sEroquel .  04/12/2020 appointment the following is noted: As noted pt has been wanting off Seroquel  so med changes last visit wre intended to help. More anxious with reduced gabapentin  and Seroquel .  Hard to stop Seroquel  bc both anxiety and insomnia and using 100 mg Seroquel .  Missed a day of work over it yesterday.  More sleep with Saphris  than Latuda .  No SE with saphris . Anxiety included fidgety. Hasn't tried Saphris  15 mg daily yet.   Has felt in a little less foggy headed with reduced Seroquel  and gabapentin .  Down on gabapentin  for a while now and only a little change with that reduction.  A little better focus and memory now. Neuropsych testing next week. Plan: Increase Saphris  15 mg HS and stop Seroquel  again.  Can reduce Seroquel  to 50 or even lower if needed.    05/09/2020 phone call with the following noted: Patient had to go back up to 600 mg Seroquel  at hs, she has tried the last few nights at 500 mg. She's been unsuccessful tapering down and just wanted to have 200 mg and 100 mg tablets available to try and get it lowered.   07/07/2020 appointment with the following noted: Got close to off Seroquel  but felt untethered and trouble with sleep initial and terminal. 5-6 hours on low dose Seroquel . Never tried Saphris  15 mg for reasons that are unclear.  Stopped shortly after the phone  session from last time. Started Seroquel  by 2002 and gabapentin  in 2003.  Finds it very difficult to get off the meds and worry over instability. Plan: Start Saphris  10 mg at night and reduce Seroquel  to 400 mg nightly for 4 nights, Then increase Saphris  to 15 mg at night and reduce Seroquel  to 200 mg for 4 nights, Then reduce Seroquel  to 100 mg and continue Saphris  at 15 mg nightly for 4 nights, Then stop Seroquel  and if insomnia occurs, increase Saphris  to 20 mg at night.  07/19/2020 phone call.  Restless legs on Saphris .  Given instructions to change to loxapine  100 mg nightly. 07/25/2020 phone call patient stating she wanted to try to continue Saphris  but have treatment for  the restless legs side effects. Prescription sent for ropinirole   09/13/2020 appointment with the following noted: Not taking Saphris .  At 10 mg HS CO jitteriness and anxiety the next day. Never took loxapine . Wonders about taking loxapine  now.  Plan: Give her this change in instructions for transition from Seroquel  to loxapine : Start loxapine  25 mg capsule 1 at night and reduce Seroquel  to 400 mg nightly for 4 nights, Then increase loxapine  to 2 capsules at night and reduce Seroquel  to 200 mg for 4 nights, Then reduce Seroquel  to 100 mg and increase loxapine  to 3 capsules nightly for 4 nights, Then stop Seroquel  and if insomnia occurs, increase loxapine  to 4 capsules at night and notify MD.   Multiple phone calls since the last visit including the following. 10/24/2020:Telephone call to patient.  She experiences an increase in anxiety whenever she reduces the Seroquel .  She is just started the transition from Seroquel  to loxapine .  She is tolerating the loxapine  well.  Encouraged her to just use the lorazepam  and transition from Seroquel  to loxapine .  It is hoped and expected that the loxapine  will effectively manage anxiety as the Seroquel  when she is on the full dose.  She agrees to the plan.  Lorazepam  prescription  sent in. 11/17/2020: RTC  See prior note.  Took 50 mg Seroquel  last night.  Realized she made a mistake in dosing.  She stopped from 200 mg Seroquel  instead of reducing to 100 mg as instructed.  Had gotten up to 4 loxapine  without seroquel  was jittery and like she'd come out of her skin and took lorazepam .  Still some problems with memory.  Last night took 50 mg Seroquel  and was a little better. Much better today than yesterday.   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   Memory testing with visuospatial problems and was worked up over that.   Take 4 loxapine  and start 75 mg Seroquel  and taper from there more slowly. Appt 11/22/20  11/22/2020:Pt requesting Rx for Loxapine  25 mg 4/d  30 day and Seroquel  200 mg 3/d   30 day and Lorazepam  0.5 mg 1-2 tab every 8 hrs PRN for anxiety  @ [facility] [employer] on file. Apt RS from 12/21 to 2/9 and on canc list. Pt going out of town 12/27-1/3 asking for Rx before 12/27.  11/22/2020 MD response: I sent in the prescription for lorazepam .  Patient is not on quetiapine  200 mg 3 daily and I will not prescribe that dose.  Ask her what dose of quetiapine  she is actually taking.  Also the insurance will not cover 4 loxapine  capsules daily.  If she is taking 4 I will need to change it to 2 of the 50 mg capsules.  Please verify her current dose of loxapine  and quetiapine .  I am weaning her off of quetiapine  she should not be on that high of a dose of quetiapine . 11/23/2020:Pt called back stating she would like to stop Loxapine  completely. She is having blurred vision and jittery. She did not pick up Loxapine  Rx. She is currently taking quetiapine  100 mg (4-25 mg tabs daily). Requesting to go back to 600 mg daily. Requesting Rx for this.  Agreed 12/01/2020:Kristen Leon called to request that you decrease her gabapentin  and prescribe Buspar .  Please call to discuss this.  appt 01/11/21.  MD response: Took buspar  years ago and wants to  try it again and reduce gabapentin .  Currently on 1200 mg HS.  Reduce to 900 mg gabapentin .   Start buspirone  5 mg twice daily, and increase to 15 mg BID. Disc SE.  She agrees.  01/11/2021 appointment with the following noted: Completely off gabapentin  for a couple of weeks maybe.  Initially anxiety a little high in AM.  Increased buspirone . Had increase anxiety when off Seroquel .  Was off about 4 days and memory was not better.  Anxiety is better now. Anxiety is a little higher off gabapentin  but not unmanageable.  Sleep is not as good off the gabapentin .  More initial insomnia and takes melatonin to manage. Not marked difference with cognition off gabapentin . Read about antipsychotics reducing brain volume and still wants to stop Seroquel  despite multiple failed attempts. Wants to switch from Seroquel  to trazodone .  Plan: OK trial Seroquel  reduction to 400 mg daily with no other med changes.  02/17/2021 appointment with the following noted: Stopped buspirone  without change. Stopped gabapentin . Reduced Seroquel  to 400 mg HS. She is convinced antipsychotic is causing brain loss.  She wants to be off antipsychotics bc of this concern and risk of dementia.  Don't think fears of dementia are unfounded. Doesn't believe she has psychotic sx. Wants to retry lithium  and then trazodone  in place of Seroquel .  Took lithium  a long time.   Acknowledges prior failures weaning Seroquel  multiple times but thinks maybe it was too fast.  Sleep most of the night and sleep less deep than 600 mg Seroquel .  Also less deep without gabapentin .  Altogether 6-7 hours but leaves the TV on.   In bed 8 watching TV and doesn't try to sleep until 1030. Plan: She  failed attempts to get off Seroquel  multiple times. OK trial Seroquel  reduction to 300 mg daily for 3-4 weeks, then 200 mg for 3-4 weeks. Disc withdrawal effects from Seroquel . Start lithium  300 mg nightly for a week then 600 mg nightly. Wait a week and get  blood level.    04/12/2021 appointment with the following noted: Did get down to Seroquel  200 mg HS and up to lithium  600 HS and trazodone  100 mg HS. Sleep more interrupted but 6-7 hours with poor quality and less well rested with change.  Mood really good with lithium  and maybe better. Often had rapid cycling and it seemed to stop. But sleep sucks and feels it affects her during the day Still takes mirtazapine  15 mg and added trazodone .  Sleep worse without trazodone . No SE with lithium . Cognition  more clear with better focus with less Seroquel .   Patient reports stable mood and denies depressed or irritable moods.  Patient denies any recent difficulty with anxiety, surprisingly. Denies appetite disturbance.  Patient reports that energy and motivation have been good.  Patient denies any difficulty with concentration.  Patient denies any suicidal ideation.  Plan: Continue Seroquel   200 mg for 3-4 weeks. Continue lithium  600 mg nightly. Check another level  Stop mirtazapine  and trazodone  Doxepin  10-30 mg HS   04/24/2021 phone call complaining of obsessive thinking and wanting medicine sent into the pharmacy. MD response: Her case is very complicated because she has complained of side effects from atypicals and worried about cognitive side effects of benzodiazepines.  She is bipolar and therefore we cannot use SSRIs.  She does not want to take atypical antipsychotics.  She is on a low dose of Seroquel  now and I am sure does not want to increase the dosage back up.  She has a as needed prescription for lorazepam  she can use that.  I will not make any other major med changes over the phone outside of a scheduled appointment.  If the symptoms are bothersome enough however going to the cancellation list and we will attempt to get her into an appointment sooner.  06/01/2021 appointment with the following noted: Thinks she stopped trazodone  and ? Took doxepin .  She vaguely think she tried it and it  didn't help sleep. Mood more stable on lithium  600 HS.  Prior mood cycling into dep and mania have resolved on it.. Still problems with her sleep. Increased Seroquel  to 400 mg on her own for sleep added gabapentin  600 and sleep is better but wants off both of them. Still on mirtazapine  30.   Cholecystectomy scheduled for August. Had stopped Seroquel  and then had intrusive thoughts which have now resolved.  At the time she called they were bad. Plan: Continue lithium  600 mg nightly. Check another level before next appt Wants to try again to taper quetiapine , will proceed as follows: Stop mirtazapine  DT NR Retry Doxepin  at higher dose of 50 mg up to 100 mg HS Once sleeping well then try to taper Seroquel  very slowly such as reduce by 50 mg every 2 to 3 weeks or even slower if necessary.  09/05/2021 appointment with the following noted: Got down to Seroquel  200 and taking gabapentin  600 mg HS. Never tried 50-100 mg for sleep. Off track for 6 weeks without enough sleep.  Cholecystectomy recently. On phone with BF too much at night talking to him bc of his work schedule.   More irritable and tired.   Older cousin moved in for 3 mos and stressed by that and esp bc he brought a dog.  He's taking advantage of her and brought his GF. Stopped lithium  AMA bc I was afraid of it.   I want to go back on lithium  bc it helped and retry doxepin  at higher dose and try wean Seroquel .  Then wean gabapenin.   Was more focused and better mood stability on lithium . Mirtazapine  does not help sleep much. Paranoid about being hurt if she dates and no history of it.  Develop a whole story about what might happen. Plan: Stop mirtazapine  DT NR Retry Doxepin  at higher dose of 50 mg up to 100 mg HS Once sleeping well then try to taper Seroquel  very slowly such as reduce by 50 mg every 2 to 3 weeks or even slower if necessary. Restart lithium  600 mg nightly. Check another level before next appt Don't stop meds AMA.    Call if there's a problem.  11/16/21 appt noted: Second week of Seroquel  150 mg daily. Is sleeping currently but not the same quality but not terrible.  On doxepin  75 mg HS Mood is good.  Likes the lithium  better than Seroquel  for mood.  Fewer hypomanic episones and more stable with less anger. Never got lithium  level.  Says she'll do so. Rare lorazepam . No SE right now. Asks about Ozempic. Plan: continue Doxepin  at higher dose of 50 mg up to 100 mg HS Once sleeping well then try to taper Seroquel  very slowly such as reduce by 50 mg every 2 to 3 weeks or even slower if necessary. Continue lithium  600 mg nightly. Get lithium  level ASAP.  Then repeat lithium  level with every 20 # of weight loss Don't stop meds AMA.   Call if there's a problem.  01/18/2022 appointment with the following noted: Ran out of doxepin  and says refill rejected for 3 weeks. Was feeling  kind of down. Down to Seroquel  25 mg HS for 2 + weeks. Still sleeping with some awakening.  Surprising good sleep.  6 and 1/2 hours of sleep and not drowsy daytime. Mood is fine.   Not sure if doxepin  helped sleep but would like to have it. Patient reports stable mood and denies depressed or irritable moods.  Patient denies any recent difficulty with anxiety.  Patient denies difficulty with sleep initiation or maintenance. Denies appetite disturbance.  Patient reports that energy and motivation have been good.  Patient denies any difficulty with concentration.  Patient denies any suicidal ideation. Plan no med chages  04/17/22 appt noted: Takes gabapentin  prn sleep and it helps. Felt dull on lithium  and U frequency so reduced it to 1of 300 mg  lithium  daily. Down to Seroquel  25 mg HS and Sonata  on occasion. Feels less dull with less lithium .  Thinks she was more depressed on lihtium at 600 mg daily.  Feels better the last 5 days.  Less urinary frequency. Sleep is less deep with less Seroquel  and shorter duration 6 hours.  It's not  terrrible but not great. 100 times better cognitively with less Seroquel .   Noticed it at work. Anxiety in check and she's surprised. Doesn't want med changes today. Plan; Cogniton better with less serotquel 25 mg HS. She wants to try lorazepam  0.5 mg HS in place of Sonata  to see if she can sleep longer. Continue lithium  600 mg nightly was recommended but she dropped it to 300 mg nightly because she complained of dullness at the 600 mg and perhaps even some depression.  We agreed on a compromise of 450 mg nightly.  Even that will be below the usual therapeutic range and there is significant risk of mood instability and relapse with this and she accepts that risk.   07/04/22 appt noted: Using lorazepam  about every 3 mos. Taking Sonata  more about 1/2 the time. Taking quetiapine  25 mg nightly and lithium  450 mg daily. Don't think meds working out for her.  Not taking lithium  enough.  More hypomania and sleep not enough nor restorative. Hypomania includes talking to herself a lot and tell funny stories to herself. Excitable mood and speech. More energy and need to burn it off.  Therapist noticed.   EMA.  About 5 hours sleep since off more Seroquel . On more lithium  had less hypomania but felt flat on more lithium .  Still frequent urination.   Saw a urologist. Ativan  not helpful sleep Plan: Cogniton complaints resolved with less serotquel 25 mg HS. But not sleeping.   Option switch to Thorazine  25 mg for less hangover.  She will call in a couple of weeks and if sleep is not better we will switch to the Thorazine .  She understands this is not an antipsychotic at these low dosages but it can have other side effects like dizziness or drowsiness. Increase lithium  to 450/600 mg QOD bc hypomania at 450 daily and dullness at 600 mg daily. Check lithium  level in 2 weks.   09/07/22 NS  10/19/2022 appointment noted: Chlorpromazine  made her feel wired and only taken once or twice and stopped. Still trouble with  sleep unless takes more Seroquel .  Seroquel  helps her go to sleep but not stay asleep. Thinks lithium  is dulling and more periods of hypomania.  Wants to stay on lithium  bc doesn't want to take antipsychotic for mood stabilizer.  Don't feel as connected to people on it bc feels flat.  Hard to feel emotional about  things.  On Seroquel  felt like personality was more normal.  Cognition definitely better off the Seroquel . Sonata  about once every 3 weeks.    Avg 5 hours of sleep nightly without napping.  Not drowsy but is tired during the day.  Can't sleep withoout meds. Has been having a lot of hypomania but no one else is complaining.  More than I usually have and can notice it at work.  More chatty and more inclined to be outspoken and borderline inappopriate.  No anger problems.   Not depressed much ever.   Gall bladder removed last year and GI Sx ongoing with ongoing workup.  GI problems predate lithium  she thinks.  02/19/23 appt noted:  Switch lithium  in AM 2 weeks ago and less compliant.  Mood less stable and more irritable.  CO polyuria and nocturia. Sleep impacted by not usually taking quetiapine .  Sleep irregular 5-6 hours.  Recognizes she can be rude to her kids.. I'm not open to an antipsychotic.   Only taking lithium  about 300 mg 3 days in last week. Taking ativan  helped her feel much better.  She wants to take one of the 0.5 mg in the AM to calm her irritability. Plan discussed off-label clonidine  for bipolar irritability and potential antimanic effect.  We did this given her reluctance to take alternatives and failure of multiple options noted below  05/22/23 appt noted: Meds: clonidine  0.1 mg BID, Namenda  , lithium  150 mg daily, Seroquel  25 mg HS and occ extra. Sonata  prn for sleep. Seroquel  helps her fall asleep.  Recently hypomania is different with clonidine .  Seeing it more often and lasts longer and wonders about taking more lithium  when it happens.  Not happened in a while. First  noted with subtherapeutic dose lithium  when coming off of it.  Saying things she wouldn't normally say.  Clonidine  helped but didn't stop it from happening.  Hypomania can last up to a week but feels more normal now.   When took steroid was hypomanic.  Still on NAC, levoxyl, vit D.  Taking atenolol also. Not particularly dep.  Anxiety manageable.  Not hypomanic now.  Feels more unrestrained in conversations and does concern her.  She feels her brain is clearer off the mood stabilizers.  Better in conversation.  Feels better emotionally bc feels more emotionally available to people than before.. Not currently irritable with clonidine , it helps a good bit.   Monitors BP and it is normal.  Saw card who said is ok to take clonidine .  Normal pulse.   Likes the clonidine  as an off label mood stabilizer with benefit for hypomania and resolved irritablity. Plan: Clonidine  0.1 mg increase to 1 in the AM and 2 at night off label for manic irritability and mild hypomania.   08/22/23 appt noted: Psych med: NAC, clonidine  0.1 mg BID and 0.2 mg HS, quetiapine  50-75 mg HS, lithium  150 mg daily. Needed increase clonidine  and card ok'd that.  Really liked clonidine  initially but now jury is still out.   Don't generally have depressive sx but had been feeling down.  Also sometimes feels like she might explode with anger.   Sleep is not good with initial and terminal. Awakens 330 and can't go back to sleep.   No sig paranoia but admits to feeling unsafe in public.  Something unexpected could happen  to she or her kids.  Don't feel that safe in grocery store. But not avoiding necessary shopping.  Partly bc Cousin killed by train 6  mos ago. Asks about trying Remeron  for sleep Cog function much better and thinks being off high dose quetiapine  has helped.  Very happy in that regard.  10/22/23 appt note: Psych med: NAC, clonidine  0.1 mg BID and 0.2 mg HS, quetiapine  50-75 mg HS, lithium  150 mg daily, added mirtazapine  30  mg HS, sonata  prn EMA,  The best I've ever been.  Better mood and sleep with mirtazapine .  Clonidine  helped.  Not as impulsive, better self control.  Feels sharper with less Seroquel . Sleep can still be erratic.  Nocturia wakes her.   Occ takes more Neurontin  400 with 200 mg Seroquel  when needs a good night sleep about once monthly.  Biggest change with clonidine  and mirtazapine .   Tolerating meds.   Not jittery nor lightheaded.   Plan: no changes  01/29/24 appt noted: Med: no NAC out, clonidine  0.1 mg BID and 0.2 mg HS, quetiapine   200 mg HS, lithium  150

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
## 2024-11-20 ENCOUNTER — Ambulatory Visit: Admitting: Clinical

## 2024-12-01 ENCOUNTER — Ambulatory Visit: Admitting: Clinical

## 2024-12-01 DIAGNOSIS — F3181 Bipolar II disorder: Secondary | ICD-10-CM
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
## 2024-12-11 ENCOUNTER — Other Ambulatory Visit: Payer: Self-pay

## 2024-12-11 ENCOUNTER — Telehealth: Payer: Self-pay | Admitting: Psychiatry

## 2024-12-11 DIAGNOSIS — F3181 Bipolar II disorder: Secondary | ICD-10-CM

## 2024-12-11 MED ORDER — QUETIAPINE FUMARATE 50 MG PO TABS: ORAL_TABLET | ORAL | 1 refills | Status: AC

## 2024-12-11 NOTE — Telephone Encounter (Signed)
 Rx sent

## 2024-12-11 NOTE — Telephone Encounter (Signed)
She is requesting a 90 day supply

## 2024-12-11 NOTE — Telephone Encounter (Signed)
 Pt called at 2:51p requesting refill of Seroquel  50mg .  She said she takes it in addition to the Seroquel  300mg .  Pls send to   [facility] - [place], [place] - [address] [facility] [address] to [facility] [place], [place] [address] Phone: [phone]  Fax: [phone]   Next appt 4/20

## 2024-12-16 ENCOUNTER — Ambulatory Visit: Admitting: Clinical

## 2024-12-16 DIAGNOSIS — F3181 Bipolar II disorder: Secondary | ICD-10-CM | POA: Diagnosis not present
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
## 2024-12-29 ENCOUNTER — Ambulatory Visit: Admitting: Clinical

## 2024-12-29 DIAGNOSIS — F3181 Bipolar II disorder: Secondary | ICD-10-CM
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
## 2025-01-15 ENCOUNTER — Ambulatory Visit: Admitting: Clinical

## 2025-01-27 ENCOUNTER — Ambulatory Visit: Admitting: Clinical

## 2025-02-18 ENCOUNTER — Ambulatory Visit: Admitting: Clinical

## 2025-03-10 ENCOUNTER — Ambulatory Visit: Admitting: Clinical

## 2025-03-22 ENCOUNTER — Telehealth: Admitting: Psychiatry

## 2025-03-24 ENCOUNTER — Ambulatory Visit: Admitting: Clinical
